# Patient Record
Sex: Male | Born: 1945 | Race: Black or African American | Hispanic: No | Marital: Single | Smoking: Former smoker
Health system: Southern US, Community
[De-identification: ages and names within clinical notes are randomized; demographics above are authoritative.]

## PROBLEM LIST (undated history)

## (undated) DIAGNOSIS — R1312 Dysphagia, oropharyngeal phase: Secondary | ICD-10-CM

## (undated) DIAGNOSIS — E559 Vitamin D deficiency, unspecified: Secondary | ICD-10-CM

## (undated) DIAGNOSIS — Z8673 Personal history of transient ischemic attack (TIA), and cerebral infarction without residual deficits: Secondary | ICD-10-CM

## (undated) DIAGNOSIS — I639 Cerebral infarction, unspecified: Secondary | ICD-10-CM

## (undated) DIAGNOSIS — K219 Gastro-esophageal reflux disease without esophagitis: Secondary | ICD-10-CM

## (undated) DIAGNOSIS — Z789 Other specified health status: Secondary | ICD-10-CM

## (undated) DIAGNOSIS — I10 Essential (primary) hypertension: Secondary | ICD-10-CM

## (undated) DIAGNOSIS — M6281 Muscle weakness (generalized): Secondary | ICD-10-CM

## (undated) DIAGNOSIS — K861 Other chronic pancreatitis: Secondary | ICD-10-CM

## (undated) DIAGNOSIS — F32A Depression, unspecified: Secondary | ICD-10-CM

## (undated) DIAGNOSIS — I82409 Acute embolism and thrombosis of unspecified deep veins of unspecified lower extremity: Secondary | ICD-10-CM

## (undated) DIAGNOSIS — D509 Iron deficiency anemia, unspecified: Secondary | ICD-10-CM

## (undated) DIAGNOSIS — K709 Alcoholic liver disease, unspecified: Secondary | ICD-10-CM

## (undated) DIAGNOSIS — D696 Thrombocytopenia, unspecified: Secondary | ICD-10-CM

## (undated) DIAGNOSIS — K859 Acute pancreatitis without necrosis or infection, unspecified: Secondary | ICD-10-CM

## (undated) HISTORY — DX: Personal history of transient ischemic attack (TIA), and cerebral infarction without residual deficits: Z86.73

## (undated) HISTORY — DX: Muscle weakness (generalized): M62.81

## (undated) HISTORY — DX: Cerebral infarction, unspecified: I63.9

## (undated) HISTORY — DX: Other chronic pancreatitis: K86.1

## (undated) HISTORY — DX: Depression, unspecified: F32.A

## (undated) HISTORY — DX: Vitamin D deficiency, unspecified: E55.9

## (undated) HISTORY — DX: Gastro-esophageal reflux disease without esophagitis: K21.9

## (undated) HISTORY — DX: Dysphagia, oropharyngeal phase: R13.12

## (undated) HISTORY — DX: Iron deficiency anemia, unspecified: D50.9

## (undated) HISTORY — DX: Alcoholic liver disease, unspecified: K70.9

## (undated) HISTORY — DX: Thrombocytopenia, unspecified: D69.6

## (undated) HISTORY — DX: Essential (primary) hypertension: I10

## (undated) HISTORY — PX: WHIPPLE PROCEDURE: SHX2667

## (undated) HISTORY — DX: Acute embolism and thrombosis of unspecified deep veins of unspecified lower extremity: I82.409

---

## 2008-01-13 DIAGNOSIS — E785 Hyperlipidemia, unspecified: Secondary | ICD-10-CM | POA: Insufficient documentation

## 2009-09-18 ENCOUNTER — Encounter (INDEPENDENT_AMBULATORY_CARE_PROVIDER_SITE_OTHER): Payer: Self-pay | Admitting: Ophthalmology

## 2009-09-18 ENCOUNTER — Ambulatory Visit (HOSPITAL_COMMUNITY): Admission: RE | Admit: 2009-09-18 | Discharge: 2009-09-18 | Payer: Self-pay | Admitting: Ophthalmology

## 2009-09-18 ENCOUNTER — Ambulatory Visit: Payer: Self-pay | Admitting: Vascular Surgery

## 2009-09-23 ENCOUNTER — Ambulatory Visit: Payer: Self-pay | Admitting: Vascular Surgery

## 2009-10-01 ENCOUNTER — Ambulatory Visit: Payer: Self-pay | Admitting: Vascular Surgery

## 2009-10-04 ENCOUNTER — Inpatient Hospital Stay (HOSPITAL_COMMUNITY): Admission: RE | Admit: 2009-10-04 | Discharge: 2009-10-05 | Payer: Self-pay | Admitting: Vascular Surgery

## 2009-10-04 ENCOUNTER — Encounter: Payer: Self-pay | Admitting: Vascular Surgery

## 2009-10-22 ENCOUNTER — Ambulatory Visit: Payer: Self-pay | Admitting: Vascular Surgery

## 2011-01-19 LAB — CBC
HCT: 28.8 % — ABNORMAL LOW (ref 39.0–52.0)
Hemoglobin: 9.8 g/dL — ABNORMAL LOW (ref 13.0–17.0)
MCHC: 34 g/dL (ref 30.0–36.0)
MCV: 88.1 fL (ref 78.0–100.0)
RBC: 3.27 MIL/uL — ABNORMAL LOW (ref 4.22–5.81)

## 2011-01-19 LAB — BASIC METABOLIC PANEL
CO2: 24 mEq/L (ref 19–32)
Glucose, Bld: 106 mg/dL — ABNORMAL HIGH (ref 70–99)
Potassium: 3.1 mEq/L — ABNORMAL LOW (ref 3.5–5.1)
Sodium: 138 mEq/L (ref 135–145)

## 2011-01-20 LAB — COMPREHENSIVE METABOLIC PANEL
Albumin: 3.8 g/dL (ref 3.5–5.2)
Alkaline Phosphatase: 83 U/L (ref 39–117)
BUN: 18 mg/dL (ref 6–23)
Calcium: 10 mg/dL (ref 8.4–10.5)
Potassium: 3.7 mEq/L (ref 3.5–5.1)
Total Protein: 7.3 g/dL (ref 6.0–8.3)

## 2011-01-20 LAB — CBC
Hemoglobin: 11.2 g/dL — ABNORMAL LOW (ref 13.0–17.0)
MCHC: 34.3 g/dL (ref 30.0–36.0)
MCV: 88.2 fL (ref 78.0–100.0)
RDW: 15.2 % (ref 11.5–15.5)

## 2011-01-20 LAB — URINALYSIS, ROUTINE W REFLEX MICROSCOPIC
Glucose, UA: NEGATIVE mg/dL
Ketones, ur: NEGATIVE mg/dL
Nitrite: NEGATIVE
Protein, ur: NEGATIVE mg/dL
Urobilinogen, UA: 0.2 mg/dL (ref 0.0–1.0)

## 2011-01-20 LAB — CROSSMATCH

## 2011-01-20 LAB — PROTIME-INR
INR: 1.32 (ref 0.00–1.49)
Prothrombin Time: 16.3 seconds — ABNORMAL HIGH (ref 11.6–15.2)

## 2011-01-20 LAB — APTT: aPTT: 31 seconds (ref 24–37)

## 2011-03-03 NOTE — Consult Note (Signed)
NEW PATIENT CONSULTATION   BRODI, KARI  DOB:  [birth year]                                       09/23/2009  [id number]#:[id number]   REGULAR MEDICAL DOCTOR:  Dr. Tyler Aas in [place].   The patient is a 65 year old male patient referred by Dr. Mitzi Davenport for  amaurosis fugax in the right eye with severe right carotid occlusive  disease.  The patient states that over the last 2 years he has had  multiple episodes of transient blindness in the right eye most recent of  which was about 3 weeks ago.  He has had two episodes in the last 2  months.  The last episode lasted about 30 minutes before total return of  vision occurred.  He has had no hemiparesis, aphasia, amaurosis fugax,  diplopia, blurred vision or syncope.  He had a prolonged hospitalization  in [place] from June to early August during which time he and his  daughter described blood clots around near his liver and spleen and what  sounds like a pulmonary embolus.  He was treated with Coumadin which he  continues to take.  I obtained much of the history from his daughter who  accompanied him.  I also called Dr. Chesley Mires today and discussed the  patient's case and his medical issues with Dr. Chesley Mires who provided  further input.   CHRONIC MEDICAL PROBLEMS:  1. Hypertension.  2. Hyperlipidemia.  3. Recent pulmonary embolus (June 2010).  4. Recent pancreatitis.  5. 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  6. 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   PREVIOUS SURGERY:  None.  He is negative for coronary artery disease,  diabetes, COPD or stroke.   FAMILY HISTORY:  Positive for coronary artery disease in father,  negative for diabetes and stroke.   SOCIAL HISTORY:  He is single and has two children.  Smoked 1-1/2 packs  of cigarettes per day for 40+ years, quit  in September of this year.  Also has had a heavy ethanol abuse history, but stopped in June 2010.   REVIEW OF SYSTEMS:  Has occasional chest discomfort, dyspnea on  exertion, urinary frequency, leg discomfort with walking, dizziness,  headaches, joint pain, muscle pain and nosebleeds.   PHYSICAL EXAMINATION:  Vital signs:  Blood pressure 105/72, heart rate  68, respirations 18.  General:  He is alert and oriented x3.  He is a  well-developed, well-nourished male in no apparent distress.  HEENT:  Exam is unremarkable.  EOMs intact.  Neck:  Supple 3+ carotid pulses,  there is a harsh bruit over the right carotid bifurcation, no  lymphadenopathy present.  Chest:  Clear to auscultation.  No wheezing.  Cardiovascular:  Regular rhythm.  No murmurs.  Abdomen:  Soft, nontender  with no masses palpable.  Musculoskeletal:  No major deformities.  Neurologic exam is unremarkable with no focal weakness.  Skin:  Reveals  no rashes.   I reviewed the medical records provided by Dr. Mitzi Davenport and also have  requested further records from the recent hospitalization at [facility]  and have discussed the situation with Dr. Chesley Mires.  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   The patient clearly needs a right carotid endarterectomy and we will  obtain more information and discuss this with him and his daughter in 1  week.  We tentatively scheduled him for right carotid endarterectomy on  Friday the 17th of December.  He has been very difficult to control with  his Coumadin according to Dr. Chesley Mires and his INR was essentially  normal for much of the time since his discharge from the hospital, so he  feels that it would probably be safe to discontinue the Coumadin in the  perioperative period for right carotid surgery.  Other issues will be  in  his heparin-induced thrombocytopenic and will likely need to use  argatroban as an anticoagulant at the time of his surgery.  They will  return in 1 week to discuss these issues.   Quita Skye Hart [place], M.D.  Electronically Signed   JDL/MEDQ  D:  09/23/2009  T:  09/24/2009  Job:  [id number]

## 2011-03-03 NOTE — Assessment & Plan Note (Signed)
OFFICE VISIT   Joe [name], Joe [name]  DOB:  [birth year]                                       10/22/2009  EAVWU#:[id number]   The patient returns today post right carotid endarterectomy performed on  December 17 for a very severe right internal carotid stenosis with  recurrent amaurosis fugax, right eye, with transient episodes of total  blindness.  He has done well since his surgery with no recurrent  episodes of blindness in the right eye.  He has had no hemiparesis,  aphasia, amaurosis fugax, diplopia, blurred vision or syncope.  He does  complain of some discomfort in his right mandibular area with chewing  but states this is slowly improving.  He is having no dysphagia or  hoarseness.  He is back on his Coumadin and his 1 aspirin per day.  He  did have some significant swelling, he states, initially after the  surgery but that has resolved.  He also complains of urinary frequency  over the last few weeks.   On examination today his blood pressure is 151/89, heart rate is 55,  respirations 14, temperature 98.  Neurologic exam is normal.  Right neck  incision is well-healed.  There is not undue edema in the neck.  He has  3+ carotid pulses with no bruits audible.   I think in general he is doing quite well, and he will be following up  with Dr. Chesley Mires in [facility] regarding possible urinary infection  if he continues to be symptomatic and also regarding the plans for  Coumadin.  He will also probably be seen in [facility] regarding this.  He will see me in 6 months with a followup carotid duplex exam unless he  develops any neurologic symptoms in the interim.     Quita Skye Hart [place], M.D.  Electronically Signed   JDL/MEDQ  D:  10/22/2009  T:  10/23/2009  Job:  [id number]

## 2011-03-03 NOTE — Assessment & Plan Note (Signed)
OFFICE VISIT   Joe [name], Joe [name]  DOB:  Jun 09, 1946                                       10/01/2009  UEAVW#:[id number]   The patient returns today for further discussion regarding his severe  right carotid stenosis and symptoms of amaurosis fugax in the right eye.  Over the last week since I last saw him he has had several episodes  lasting 20-30 minutes of blurred vision in the right eye but no total  blindness as he has had in the past.  He has had no hemiparesis or  aphasia or other neurologic symptoms.  He also has had no chest  discomfort, dyspnea on exertion, PND, orthopnea or other specific  symptoms.   CHRONIC MEDICAL PROBLEMS:  1. Include hypertension.  2. Hyperlipidemia.  3. Possible recent pulmonary embolus in June of 2010.  4. Chronic pancreatitis.  5. History of severe mesenteric venous and portal vein and splenic      vein thrombosis and hospitalization at [facility].  6. Questionable history of heparin induced thrombocytopenia in July      and August in [place].   FAMILY HISTORY:  Is positive for coronary artery disease.  Negative for  diabetes and stroke.   SOCIAL HISTORY:  He has smoked one half pack of cigarettes a day for 40+  years but quit in September of this year.  He is not using alcohol at  the present time having stopped in June of 2010.   PHYSICAL EXAMINATION:  Vital signs:  Blood pressure today is 140/70,  heart rate 70, respirations 14.  Right carotid pulse is 3+ with a high-  pitched bruit.  Neurological:  Normal.  Chest:  Clear to auscultation.  Cardiovascular:  Regular rhythm, no murmurs.  Abdomen:  Soft, nontender  with no masses.  Neurologic:  Exam is normal.  He is alert and oriented.  He is a well-developed, well-nourished male who is in no apparent  distress.   I have researched his history of heparin induced thrombocytopenia by  having 2 telephone conversations with Dr. Janie Morning in [place] who  spoke also  with Dr. Evelena Asa who are the coagulation specialists at  [place].  They are unsure that the patient truly had heparin induced  thrombocytopenia as all of the assays were negative at that time  although he did have a drop in his platelet count.  Their recommendation  is not to treat him with Argatroban or lepirudin but to use heparin and  reverse this with protamine as we normally do but to monitor his  platelet count.  They also question whether he needs to continue on  Coumadin postoperatively and they have recommended referring him back to  [facility] via Dr. Steele Sizer for further discussion of this.   The plan is to admit the patient on this Friday, December 17 for right  carotid endarterectomy.  The risks and benefits have been thoroughly  discussed with the patient and his daughter and they would like to  proceed.  He has been off Coumadin now for 3 days.   Joe [name], M.D.  Electronically Signed   JDL/MEDQ  D:  10/01/2009  T:  10/02/2009  Job:  [id number]   cc:   Dr Raeanne Barry

## 2011-05-25 DIAGNOSIS — K7030 Alcoholic cirrhosis of liver without ascites: Secondary | ICD-10-CM | POA: Insufficient documentation

## 2014-12-04 DIAGNOSIS — I10 Essential (primary) hypertension: Secondary | ICD-10-CM | POA: Insufficient documentation

## 2017-04-29 DIAGNOSIS — J449 Chronic obstructive pulmonary disease, unspecified: Secondary | ICD-10-CM | POA: Insufficient documentation

## 2018-03-19 ENCOUNTER — Inpatient Hospital Stay
Admission: EM | Admit: 2018-03-19 | Discharge: 2018-03-29 | DRG: 438 | Disposition: A | Discharge status: Skilled Nursing Facility | Payer: Medicare Other | Attending: Internal Medicine | Admitting: Internal Medicine

## 2018-03-19 ENCOUNTER — Other Ambulatory Visit: Payer: Self-pay

## 2018-03-19 ENCOUNTER — Encounter: Payer: Self-pay | Admitting: Emergency Medicine

## 2018-03-19 ENCOUNTER — Emergency Department: Payer: Medicare Other

## 2018-03-19 DIAGNOSIS — Z66 Do not resuscitate: Secondary | ICD-10-CM | POA: Diagnosis not present

## 2018-03-19 DIAGNOSIS — R0902 Hypoxemia: Secondary | ICD-10-CM | POA: Diagnosis present

## 2018-03-19 DIAGNOSIS — E440 Moderate protein-calorie malnutrition: Secondary | ICD-10-CM | POA: Diagnosis not present

## 2018-03-19 DIAGNOSIS — R0602 Shortness of breath: Secondary | ICD-10-CM | POA: Diagnosis not present

## 2018-03-19 DIAGNOSIS — E876 Hypokalemia: Secondary | ICD-10-CM | POA: Diagnosis present

## 2018-03-19 DIAGNOSIS — N170 Acute kidney failure with tubular necrosis: Secondary | ICD-10-CM | POA: Diagnosis present

## 2018-03-19 DIAGNOSIS — Z7901 Long term (current) use of anticoagulants: Secondary | ICD-10-CM

## 2018-03-19 DIAGNOSIS — K769 Liver disease, unspecified: Secondary | ICD-10-CM | POA: Diagnosis present

## 2018-03-19 DIAGNOSIS — G9341 Metabolic encephalopathy: Secondary | ICD-10-CM | POA: Diagnosis not present

## 2018-03-19 DIAGNOSIS — I10 Essential (primary) hypertension: Secondary | ICD-10-CM | POA: Diagnosis present

## 2018-03-19 DIAGNOSIS — R531 Weakness: Secondary | ICD-10-CM | POA: Diagnosis not present

## 2018-03-19 DIAGNOSIS — I6782 Cerebral ischemia: Secondary | ICD-10-CM | POA: Diagnosis present

## 2018-03-19 DIAGNOSIS — K859 Acute pancreatitis without necrosis or infection, unspecified: Principal | ICD-10-CM

## 2018-03-19 DIAGNOSIS — R443 Hallucinations, unspecified: Secondary | ICD-10-CM | POA: Diagnosis not present

## 2018-03-19 DIAGNOSIS — R1900 Intra-abdominal and pelvic swelling, mass and lump, unspecified site: Secondary | ICD-10-CM | POA: Diagnosis not present

## 2018-03-19 DIAGNOSIS — R0989 Other specified symptoms and signs involving the circulatory and respiratory systems: Secondary | ICD-10-CM

## 2018-03-19 DIAGNOSIS — R609 Edema, unspecified: Secondary | ICD-10-CM

## 2018-03-19 DIAGNOSIS — Z7189 Other specified counseling: Secondary | ICD-10-CM | POA: Diagnosis not present

## 2018-03-19 DIAGNOSIS — J9811 Atelectasis: Secondary | ICD-10-CM | POA: Diagnosis present

## 2018-03-19 DIAGNOSIS — Z8249 Family history of ischemic heart disease and other diseases of the circulatory system: Secondary | ICD-10-CM | POA: Diagnosis not present

## 2018-03-19 DIAGNOSIS — Z79899 Other long term (current) drug therapy: Secondary | ICD-10-CM

## 2018-03-19 DIAGNOSIS — K861 Other chronic pancreatitis: Secondary | ICD-10-CM | POA: Diagnosis present

## 2018-03-19 DIAGNOSIS — Z90411 Acquired partial absence of pancreas: Secondary | ICD-10-CM

## 2018-03-19 DIAGNOSIS — I739 Peripheral vascular disease, unspecified: Secondary | ICD-10-CM | POA: Diagnosis present

## 2018-03-19 DIAGNOSIS — E162 Hypoglycemia, unspecified: Secondary | ICD-10-CM | POA: Diagnosis present

## 2018-03-19 DIAGNOSIS — N390 Urinary tract infection, site not specified: Secondary | ICD-10-CM | POA: Diagnosis present

## 2018-03-19 DIAGNOSIS — D696 Thrombocytopenia, unspecified: Secondary | ICD-10-CM

## 2018-03-19 DIAGNOSIS — I639 Cerebral infarction, unspecified: Secondary | ICD-10-CM

## 2018-03-19 DIAGNOSIS — J90 Pleural effusion, not elsewhere classified: Secondary | ICD-10-CM | POA: Diagnosis present

## 2018-03-19 DIAGNOSIS — I5030 Unspecified diastolic (congestive) heart failure: Secondary | ICD-10-CM | POA: Diagnosis not present

## 2018-03-19 DIAGNOSIS — Z86718 Personal history of other venous thrombosis and embolism: Secondary | ICD-10-CM

## 2018-03-19 DIAGNOSIS — R112 Nausea with vomiting, unspecified: Secondary | ICD-10-CM | POA: Diagnosis present

## 2018-03-19 DIAGNOSIS — Z7982 Long term (current) use of aspirin: Secondary | ICD-10-CM | POA: Diagnosis not present

## 2018-03-19 DIAGNOSIS — R0603 Acute respiratory distress: Secondary | ICD-10-CM | POA: Diagnosis present

## 2018-03-19 DIAGNOSIS — Z885 Allergy status to narcotic agent status: Secondary | ICD-10-CM

## 2018-03-19 DIAGNOSIS — F05 Delirium due to known physiological condition: Secondary | ICD-10-CM | POA: Diagnosis not present

## 2018-03-19 DIAGNOSIS — F17210 Nicotine dependence, cigarettes, uncomplicated: Secondary | ICD-10-CM | POA: Diagnosis not present

## 2018-03-19 DIAGNOSIS — G9340 Encephalopathy, unspecified: Secondary | ICD-10-CM | POA: Diagnosis not present

## 2018-03-19 DIAGNOSIS — E861 Hypovolemia: Secondary | ICD-10-CM | POA: Diagnosis not present

## 2018-03-19 DIAGNOSIS — R410 Disorientation, unspecified: Secondary | ICD-10-CM | POA: Diagnosis not present

## 2018-03-19 DIAGNOSIS — E874 Mixed disorder of acid-base balance: Secondary | ICD-10-CM | POA: Diagnosis present

## 2018-03-19 DIAGNOSIS — R1011 Right upper quadrant pain: Secondary | ICD-10-CM | POA: Diagnosis not present

## 2018-03-19 DIAGNOSIS — I824Y1 Acute embolism and thrombosis of unspecified deep veins of right proximal lower extremity: Secondary | ICD-10-CM | POA: Diagnosis not present

## 2018-03-19 DIAGNOSIS — Z515 Encounter for palliative care: Secondary | ICD-10-CM | POA: Diagnosis not present

## 2018-03-19 HISTORY — DX: Acute pancreatitis without necrosis or infection, unspecified: K85.90

## 2018-03-19 HISTORY — DX: Other specified health status: Z78.9

## 2018-03-19 LAB — CBC WITH DIFFERENTIAL/PLATELET
Basophils Absolute: 0 10*3/uL (ref 0–0.1)
Basophils Relative: 0 %
EOS PCT: 0 %
Eosinophils Absolute: 0 10*3/uL (ref 0–0.7)
HEMATOCRIT: 33.2 % — AB (ref 40.0–52.0)
HEMOGLOBIN: 10.7 g/dL — AB (ref 13.0–18.0)
LYMPHS ABS: 0.3 10*3/uL — AB (ref 1.0–3.6)
LYMPHS PCT: 6 %
MCH: 24 pg — AB (ref 26.0–34.0)
MCHC: 32.1 g/dL (ref 32.0–36.0)
MCV: 74.5 fL — AB (ref 80.0–100.0)
Monocytes Absolute: 0 10*3/uL — ABNORMAL LOW (ref 0.2–1.0)
Monocytes Relative: 1 %
NEUTROS ABS: 4.5 10*3/uL (ref 1.4–6.5)
NEUTROS PCT: 93 %
Platelets: 129 10*3/uL — ABNORMAL LOW (ref 150–440)
RBC: 4.45 MIL/uL (ref 4.40–5.90)
RDW: 21.3 % — ABNORMAL HIGH (ref 11.5–14.5)
WBC: 4.9 10*3/uL (ref 3.8–10.6)

## 2018-03-19 LAB — COMPREHENSIVE METABOLIC PANEL
ALK PHOS: 100 U/L (ref 38–126)
ALT: 15 U/L — AB (ref 17–63)
AST: 52 U/L — ABNORMAL HIGH (ref 15–41)
Albumin: 3 g/dL — ABNORMAL LOW (ref 3.5–5.0)
Anion gap: 12 (ref 5–15)
BILIRUBIN TOTAL: 2.9 mg/dL — AB (ref 0.3–1.2)
BUN: 16 mg/dL (ref 6–20)
CALCIUM: 8.4 mg/dL — AB (ref 8.9–10.3)
CO2: 15 mmol/L — ABNORMAL LOW (ref 22–32)
CREATININE: 1.04 mg/dL (ref 0.61–1.24)
Chloride: 110 mmol/L (ref 101–111)
GFR calc non Af Amer: >60 mL/min (ref >60)
Glucose, Bld: 85 mg/dL (ref 65–99)
Potassium: 3.2 mmol/L — ABNORMAL LOW (ref 3.5–5.1)
Sodium: 137 mmol/L (ref 135–145)
Total Protein: 6.4 g/dL — ABNORMAL LOW (ref 6.5–8.1)

## 2018-03-19 LAB — MAGNESIUM: Magnesium: 1.3 mg/dL — ABNORMAL LOW (ref 1.7–2.4)

## 2018-03-19 LAB — LIPASE, BLOOD: LIPASE: 312 U/L — AB (ref 11–51)

## 2018-03-19 MED ORDER — IOHEXOL 300 MG/ML  SOLN: 75.0000 mL | Freq: Once | INTRAMUSCULAR | Status: DC | PRN

## 2018-03-19 MED ORDER — ONDANSETRON HCL 4 MG/2ML IJ SOLN: 4.0000 mg | Freq: Four times a day (QID) | INTRAMUSCULAR | Status: DC | PRN

## 2018-03-19 MED ORDER — MIRTAZAPINE 15 MG PO TABS
15.0000 mg | ORAL_TABLET | Freq: Every day | ORAL | Status: DC
  Administered 2018-03-19 – 2018-03-20 (×2): 15 mg via ORAL
  Filled 2018-03-19 (×2): qty 1

## 2018-03-19 MED ORDER — ONDANSETRON HCL 4 MG/2ML IJ SOLN
4.0000 mg | Freq: Once | INTRAMUSCULAR | Status: AC
  Administered 2018-03-19: 4 mg via INTRAVENOUS
  Filled 2018-03-19: qty 2

## 2018-03-19 MED ORDER — ACETAMINOPHEN 325 MG PO TABS: 650.0000 mg | ORAL_TABLET | Freq: Four times a day (QID) | ORAL | Status: DC | PRN

## 2018-03-19 MED ORDER — SODIUM CHLORIDE 0.9 % IV BOLUS
500.0000 mL | Freq: Once | INTRAVENOUS | Status: AC
  Administered 2018-03-19: 500 mL via INTRAVENOUS

## 2018-03-19 MED ORDER — ACETAMINOPHEN 650 MG RE SUPP: 650.0000 mg | Freq: Four times a day (QID) | RECTAL | Status: DC | PRN

## 2018-03-19 MED ORDER — ONDANSETRON HCL 4 MG PO TABS: 4.0000 mg | ORAL_TABLET | Freq: Four times a day (QID) | ORAL | Status: DC | PRN

## 2018-03-19 MED ORDER — IOPAMIDOL (ISOVUE-300) INJECTION 61%: 75.0000 mL | Freq: Once | INTRAVENOUS | Status: DC | PRN

## 2018-03-19 MED ORDER — POTASSIUM CHLORIDE IN NACL 20-0.9 MEQ/L-% IV SOLN
INTRAVENOUS | Status: DC
  Administered 2018-03-19 – 2018-03-20 (×3): via INTRAVENOUS
  Filled 2018-03-19 (×6): qty 1000

## 2018-03-19 MED ORDER — HYDROMORPHONE HCL 1 MG/ML IJ SOLN
1.0000 mg | INTRAMUSCULAR | Status: DC | PRN
  Administered 2018-03-20 – 2018-03-21 (×4): 1 mg via INTRAVENOUS
  Filled 2018-03-19 (×4): qty 1

## 2018-03-19 MED ORDER — SODIUM CHLORIDE 0.9 % IV BOLUS
1000.0000 mL | Freq: Once | INTRAVENOUS | Status: AC
  Administered 2018-03-19: 1000 mL via INTRAVENOUS

## 2018-03-19 MED ORDER — RIVAROXABAN 20 MG PO TABS
20.0000 mg | ORAL_TABLET | Freq: Every day | ORAL | Status: DC
  Administered 2018-03-19: 20 mg via ORAL
  Filled 2018-03-19 (×3): qty 1

## 2018-03-19 MED ORDER — FENTANYL CITRATE (PF) 100 MCG/2ML IJ SOLN
50.0000 ug | Freq: Once | INTRAMUSCULAR | Status: AC
Start: 2018-03-19 — End: 2018-03-19
  Administered 2018-03-19: 50 ug via INTRAVENOUS
  Filled 2018-03-19: qty 2

## 2018-03-19 MED ORDER — HALOPERIDOL LACTATE 5 MG/ML IJ SOLN
2.5000 mg | Freq: Four times a day (QID) | INTRAMUSCULAR | Status: DC | PRN
  Administered 2018-03-20 (×3): 2.5 mg via INTRAVENOUS
  Filled 2018-03-19 (×3): qty 1

## 2018-03-19 NOTE — ED Notes (Signed)
Pt reports abdominal cramping at this time, notified MD Derrill KayGoodman. Pt is more alert at this time. Will continue to monitor.

## 2018-03-19 NOTE — ED Provider Notes (Signed)
[facility] Emergency Department Provider Note   ____________________________________________   I have reviewed the triage vital signs and the nursing notes.   HISTORY  Chief Complaint Emesis and Diarrhea   History limited by: Not Limited   HPI Joe [name] is a 72 y.o. male who presents to the emergency department today because of nausea, vomiting and abdominal pain. Symptoms started yesterday. They started suddenly. He has had multiple episodes of vomiting and diarrhea, both non bloody. This has been accompanied by generalized abdominal pain. Patient has history of pancreatic surgery although he is not sure why or what they did. Denies that it was cancer. Denies any recent abnormal ingestion. Denies any recent sick contacts.     Per medical record review patient has a history of pancreatitis, venous thrombosis.   Past Medical History:  Diagnosis Date  . Pancreatitis   . Patient denies medical problems     There are no active problems to display for this patient.     Prior to Admission medications   Not on File    Allergies Morphine and related  Family History  Problem Relation Age of Onset  . Brain cancer Mother   . Heart disease Brother     Social History Social History   Tobacco Use  . Smoking status: Current Every Day Smoker    Packs/day: 0.50    Years: 50.00    Pack years: 25.00    Types: Cigarettes  Substance Use Topics  . Alcohol use: Not Currently  . Drug use: Not Currently    Review of Systems Constitutional: Positive for fever Eyes: No visual changes. ENT: No sore throat. Cardiovascular: Denies chest pain. Respiratory: Denies shortness of breath. Gastrointestinal: Positive for generalized abdominal pain. Positive for nausea and vomiting.  Genitourinary: Negative for dysuria. Musculoskeletal: Negative for back pain. Skin: Negative for rash. Neurological: Negative for headaches, focal weakness or  numbness.  ____________________________________________   PHYSICAL EXAM:  VITAL SIGNS: ED Triage Vitals [03/19/18 1117]  Enc Vitals Group     BP 98/59     Pulse 115     Resp 24     Temp 100.1     Temp src      SpO2 100     Weight      Height      Head Circumference      Peak Flow      Pain Score 10   Constitutional: Alert and oriented.  Eyes: Conjunctivae are normal.  ENT      Head: Normocephalic and atraumatic.      Nose: No congestion/rhinnorhea.      Mouth/Throat: Mucous membranes are moist.      Neck: No stridor. Hematological/Lymphatic/Immunilogical: No cervical lymphadenopathy. Cardiovascular: Normal rate, regular rhythm.  No murmurs, rubs, or gallops.  Respiratory: Normal respiratory effort without tachypnea nor retractions. Breath sounds are clear and equal bilaterally. No wheezes/rales/rhonchi. Gastrointestinal: Soft and tender to palpation in the epigastric region. No rebound. No guarding.  Genitourinary: Deferred Musculoskeletal: Normal range of motion in all extremities. No lower extremity edema. Neurologic:  Normal speech and language. No gross focal neurologic deficits are appreciated.  Skin:  Skin is warm, dry and intact. No rash noted. Psychiatric: Mood and affect are normal. Speech and behavior are normal. Patient exhibits appropriate insight and judgment.  ____________________________________________    LABS (pertinent positives/negatives)  Lipase 312 CMP na 137, k 3.2, cr 1.04 CBC wbc 4.9, hgb 10.7, plt 129  ____________________________________________   EKG  None  ____________________________________________    RADIOLOGY  CT abd/pel Post surgical changes  ____________________________________________   PROCEDURES  Procedures  Angiocath insertion Performed by: Phineas Semen  Consent: Verbal consent obtained. Risks and benefits: risks, benefits and alternatives were discussed Time out: Immediately prior to procedure a  "time out" was called to verify the correct patient, procedure, equipment, support staff and site/side marked as required.  Preparation: Patient was prepped and draped in the usual sterile fashion.  Vein Location: left a-c  Ultrasound Guided  Gauge: 20  Normal blood return and flush without difficulty Patient tolerance: Patient tolerated the procedure well with no immediate complications.  ____________________________________________   INITIAL IMPRESSION / ASSESSMENT AND PLAN / ED COURSE  Pertinent labs & imaging results that were available during my care of the patient were reviewed by me and considered in my medical decision making (see chart for details).   Patient presented to the emergency department today because of concerns for abdominal pain nausea vomiting.  Differential would be broad including gastroenteritis gastritis pancreatitis biliary disease amongst other etiologies.  Patient's lipase is elevated 300.  I did obtain a CT given history of surgeries in the past.  This however did not show any acute findings.  Patient did have significant pain.  Will plan on admission for IV hydration pain control. Discussed findings and plan with patient and family.   ____________________________________________   FINAL CLINICAL IMPRESSION(S) / ED DIAGNOSES  Final diagnoses:  Acute pancreatitis, unspecified complication status, unspecified pancreatitis type     Note: This dictation was prepared with Dragon dictation. Any transcriptional errors that result from this process are unintentional     Phineas Semen, MD 03/20/18 1558

## 2018-03-19 NOTE — ED Notes (Signed)
Report called to floor RN and pt updated with plan of care.

## 2018-03-19 NOTE — ED Triage Notes (Signed)
Arrives with C/O vomiting and diarrhea x 1 day.  C/o weakness.

## 2018-03-19 NOTE — ED Notes (Signed)
Pt resting on stretcher at this time. Family updated on plan of care and awaiting bed assignment. Call bell within reach, will continue to monitor.

## 2018-03-19 NOTE — ED Notes (Signed)
Pt and family updated with plan of care. VSS. Call bell within reach, will continue to monitor.

## 2018-03-19 NOTE — ED Notes (Signed)
Pt daughter at bedside reports "he usually needs to be admitted when he gets pancreatitis and sometimes gets cdiff in his stool when he gets antibiotics" notified MD Derrill KayGoodman. Pt asleep with even/unlabored respirations. Will continue to monitor.

## 2018-03-19 NOTE — ED Notes (Signed)
RN and medic attempted IV start without success. Minimal lab work obtained and sent to lab. Pt reports he is hard stick, notified MD Derrill KayGoodman who will attempt IV started. Pt and family updated. Daughter is at bedside now.

## 2018-03-19 NOTE — ED Notes (Signed)
MD Derrill KayGoodman at bedside and obtained US guided IV.

## 2018-03-19 NOTE — ED Notes (Addendum)
Pt arrives via EMS for N/V/D for last couple of days. Hx of wipple and chronic pancreatitis. Pt lives with brother who reports that the pt has not been acting himself over last couple days. +weak and lethargic. Pt only alert to himself at this time. +nausea. Pt reports back and abdominal pain. Pt denies SOB or CP.

## 2018-03-19 NOTE — H&P (Signed)
[facility] [place] at [facility]   PATIENT NAME: Joe [name]    MR#:  [id number]  DATE OF BIRTH:  1946-06-05  DATE OF ADMISSION:  03/19/2018  PRIMARY CARE PHYSICIAN: Patient, [name] Per   REQUESTING/REFERRING PHYSICIAN: Dr. Phineas Semen  CHIEF COMPLAINT:   Chief Complaint  Patient presents with  . Emesis  . Diarrhea    HISTORY OF PRESENT ILLNESS:  Joe [name]  is a 72 y.o. male with a known history of chronic pancreatitis, status post Whipple procedure, essential hypertension, previous history of DVT who presents to the hospital due to nausea vomiting and also abdominal pain.  Patient presently is somewhat confused and therefore most of the history obtained from the daughter at bedside.  Patient resides with his brother who noted that the patient was having multiple episodes of nausea vomiting today.  He then became increasingly weak and could not get out of bed and therefore he called EMS and he was brought to the ER for further evaluation.  In the ER patient continues to complain of generalized abdominal pain but has not had any further nausea vomiting.  Patient's blood work was consistent with mild acute pancreatitis.  His CT scan of the abdomen pelvis shows postsurgical changes from his previous Whipple procedure but no evidence of acute pancreatitis.  Hospitalist services were contacted for treatment evaluation.  Patient denies any fever, cough, congestion, melena, hematochezia, or any other associated symptoms presently.  PAST MEDICAL HISTORY:   Past Medical History:  Diagnosis Date  . Pancreatitis   . Patient denies medical problems     PAST SURGICAL HISTORY:   S/p Whipple   SOCIAL HISTORY:   Social History   Tobacco Use  . Smoking status: Current Every Day Smoker    Packs/day: 0.50    Years: 50.00    Pack years: 25.00    Types: Cigarettes  Substance Use Topics  . Alcohol use: Not Currently    FAMILY HISTORY:   Family History  Problem  Relation Age of Onset  . Brain cancer Mother   . Heart disease Brother     DRUG ALLERGIES:   Allergies  Allergen Reactions  . Morphine And Related Hives and Itching    REVIEW OF SYSTEMS:   Review of Systems  Constitutional: Negative for fever and weight loss.  HENT: Negative for congestion, nosebleeds and tinnitus.   Eyes: Negative for blurred vision, double vision and redness.  Respiratory: Negative for cough, hemoptysis and shortness of breath.   Cardiovascular: Negative for chest pain, orthopnea, leg swelling and PND.  Gastrointestinal: Positive for abdominal pain, nausea and vomiting. Negative for diarrhea and melena.  Genitourinary: Negative for dysuria, hematuria and urgency.  Musculoskeletal: Negative for falls and joint pain.  Neurological: Negative for dizziness, tingling, sensory change, focal weakness, seizures, weakness and headaches.  Endo/Heme/Allergies: Negative for polydipsia. Does not bruise/bleed easily.  Psychiatric/Behavioral: Negative for depression and memory loss. The patient is not nervous/anxious.     MEDICATIONS AT HOME:   Prior to Admission medications   Medication Sig Start Date End Date Taking? Authorizing Provider  carvedilol (COREG) 3.125 MG tablet Take 3.125 mg by mouth 2 (two) times daily with a meal.   Yes [provider]  lipase/protease/amylase (CREON) 36000 UNITS CPEP capsule Take 72,000 Units by mouth 3 (three) times daily with meals.   Yes [provider]  mirtazapine (REMERON) 15 MG tablet Take 15 mg by mouth at bedtime.   Yes [provider]  rivaroxaban (  XARELTO) 20 MG TABS tablet Take 20 mg by mouth daily with supper.   Yes [provider]      VITAL SIGNS:  Blood pressure (!) 108/58, pulse 84, temperature 100.1 F (37.8 C), temperature source Oral, resp. rate 20, height 5\' 8"  (1.727 m), weight 59 kg (130 lb), SpO2 92 %.  PHYSICAL EXAMINATION:  Physical Exam  GENERAL:  72 y.o.-year-old patient  lying in bed with no acute distress.  EYES: Pupils equal, round, reactive to light and accommodation. No scleral icterus. Extraocular muscles intact.  HEENT: Head atraumatic, normocephalic. Oropharynx and nasopharynx clear. No oropharyngeal erythema, dry oral mucosa  NECK:  Supple, no jugular venous distention. No thyroid enlargement, no tenderness.  LUNGS: Normal breath sounds bilaterally, no wheezing, rales, rhonchi. No use of accessory muscles of respiration.  CARDIOVASCULAR: S1, S2 RRR. No murmurs, rubs, gallops, clicks.  ABDOMEN: Soft, tender in the epigastric area, no rebound, rigidity, nondistended. Bowel sounds present. No organomegaly or mass.  EXTREMITIES: No pedal edema, cyanosis, or clubbing. + 2 pedal & radial pulses b/l.   NEUROLOGIC: Cranial nerves II through XII are intact. No focal Motor or sensory deficits appreciated b/l. Globally weak.  PSYCHIATRIC: The patient is alert and oriented x 1.  SKIN: No obvious rash, lesion, or ulcer.   LABORATORY PANEL:   CBC Recent Labs  Lab 03/19/18 1149  WBC 4.9  HGB 10.7*  HCT 33.2*  PLT 129*   ------------------------------------------------------------------------------------------------------------------  Chemistries  Recent Labs  Lab 03/19/18 1149  NA 137  K 3.2*  CL 110  CO2 15*  GLUCOSE 85  BUN 16  CREATININE 1.04  CALCIUM 8.4*  AST 52*  ALT 15*  ALKPHOS 100  BILITOT 2.9*   ------------------------------------------------------------------------------------------------------------------  Cardiac Enzymes No results for input(s): TROPONINI in the last 168 hours. ------------------------------------------------------------------------------------------------------------------  RADIOLOGY:  Ct Abdomen Pelvis Wo Contrast  Result Date: 03/19/2018 CLINICAL DATA:  72 year old male with nausea, vomiting, diarrhea, abdominal and back pain. History of chronic pancreatitis. EXAM: CT ABDOMEN AND PELVIS WITHOUT CONTRAST  TECHNIQUE: Multidetector CT imaging of the abdomen and pelvis was performed following the standard protocol without IV contrast. COMPARISON:  Prior CT scan of the chest 10/05/2009 FINDINGS: Lower chest: The visualized lower chest is clear. The intracardiac blood pool is hypodense relative to the adjacent myocardium consistent with anemia. No pericardial effusion. Unremarkable distal thoracic esophagus. Hepatobiliary: There are 2 gastro hepatic biliary drainage catheters. One extends from the common bile duct into the gastric lumen, and the second extends from the proximal small bowel into the stomach. Scattered pneumobilia is present and not unexpected. There is persistent dilatation of the right posterior bile ducts. No definite discrete hepatic lesion. Pancreas: Sequelae of chronic pancreatitis with multiple small pancreatic calcifications. Dilatation of the main pancreatic duct is present. Spleen: Within normal limits Adrenals/Urinary Tract: Unremarkable adrenal glands. Nonspecific perinephric stranding bilaterally worse on the left than the right. No hydronephrosis or nephrolithiasis. Unremarkable ureters and bladder. Stomach/Bowel: Colonic diverticular disease without CT evidence of active inflammation. Surgical changes of prior right hemicolectomy. Surgical changes of prior partial gastrectomy. No evidence of a bowel obstruction or focal bowel wall thickening. Vascular/Lymphatic: Limited evaluation in the absence of intravenous contrast. Extensive atherosclerotic vascular calcifications. No aneurysm. Reproductive: Prostate is unremarkable. Other: No abdominal wall hernia or abnormality. No abdominopelvic ascites. Musculoskeletal: Chronic bilateral L5 pars defects. Mild grade 1 anterolisthesis of L5 on S1. Multilevel degenerative disc disease. No acute fracture or malalignment. IMPRESSION: 1. Two gastro hepatic biliary drainage catheters are present, 1  extending from the stomach into the common bile duct, and  the second from the stomach into the proximal small bowel. There is expected pneumobilia, however the right posterior bile ducts are slightly dilated. It is unclear if this is chronic, or an interval change without prior imaging for comparison. Recommend clinical correlation for signs and symptoms of cholangitis. 2. Extensive surgical changes including prior partial gastric resection with gastroenteric anastomosis and prior right hemicolectomy. No evidence of bowel obstruction. 3. Colonic diverticular disease without CT evidence of active inflammation. 4. The intracardiac blood pool is hypodense relative to the adjacent myocardium consistent with anemia. 5. Sequelae of chronic pancreatitis with likely chronic dilatation of the main pancreatic duct. 6. Chronic bilateral L5 pars defects with mild grade 1 anterolisthesis of L5 on S1. 7.  Aortic Atherosclerosis (ICD10-170.0) Electronically Signed   By: Malachy Moan M.D.   On: 03/19/2018 13:55     IMPRESSION AND PLAN:   72 year old male with past medical history of chronic pancreatitis, hypertension, status post Whipple procedure, previous history of DVT who presents to the hospital due to abdominal pain, nausea/vomiting and noted to have acute on chronic pancreatitis.  1.  Acute on chronic pancreatitis-this is a cause of patient's worsening abdominal pain/nausea vomiting.  Patient CT scan of the abdomen pelvis is suggestive of postsurgical changes from his previous Whipple procedure without any acute active inflammation presently. -Patient's lipase is although mildly elevated, and his clinical symptoms are consistent with pancreatitis. - We will treat the patient supportively with IV fluids, pain control, antiemetics.  Keep patient n.p.o. for now. -Follow lipase and LFTs in the morning.  2.  History of chronic pancreatitis- hold off on Creon supplements until patient can take p.o.  3.  History of previous DVT-continue Xarelto.  4.  Essential  hypertension-hold carvedilol given the patient's relative hypotension.  5.  Hypokalemia-secondary to the intractable nausea vomiting.  Will replace potassium intravenously.  Check magnesium level.  Repeat level in the morning.    All the records are reviewed and case discussed with ED provider. Management plans discussed with the patient, family and they are in agreement.  CODE STATUS: Full code  TOTAL TIME TAKING CARE OF THIS PATIENT: 45 minutes.    Houston Siren M.D on 03/19/2018 at 5:38 PM  Between 7am to 6pm - Pager - [phone]  After 6pm go to [email or web address] - password [email or web address]  [facility]  Office  [phone]  CC: Primary care physician; Patient, [name] Per

## 2018-03-20 ENCOUNTER — Inpatient Hospital Stay: Payer: Medicare Other

## 2018-03-20 LAB — COMPREHENSIVE METABOLIC PANEL
ALBUMIN: 2.4 g/dL — AB (ref 3.5–5.0)
ALK PHOS: 78 U/L (ref 38–126)
ALT: 28 U/L (ref 17–63)
AST: 87 U/L — ABNORMAL HIGH (ref 15–41)
Anion gap: 9 (ref 5–15)
BILIRUBIN TOTAL: 4.3 mg/dL — AB (ref 0.3–1.2)
BUN: 25 mg/dL — ABNORMAL HIGH (ref 6–20)
CALCIUM: 7.6 mg/dL — AB (ref 8.9–10.3)
CO2: 15 mmol/L — AB (ref 22–32)
CREATININE: 1.95 mg/dL — AB (ref 0.61–1.24)
Chloride: 116 mmol/L — ABNORMAL HIGH (ref 101–111)
GFR calc non Af Amer: 33 mL/min — ABNORMAL LOW (ref >60)
GFR, EST AFRICAN AMERICAN: 38 mL/min — AB (ref >60)
GLUCOSE: 73 mg/dL (ref 65–99)
Potassium: 3.8 mmol/L (ref 3.5–5.1)
SODIUM: 140 mmol/L (ref 135–145)
TOTAL PROTEIN: 5.5 g/dL — AB (ref 6.5–8.1)

## 2018-03-20 LAB — CBC
HCT: 32.3 % — ABNORMAL LOW (ref 40.0–52.0)
Hemoglobin: 10.3 g/dL — ABNORMAL LOW (ref 13.0–18.0)
MCH: 23.8 pg — AB (ref 26.0–34.0)
MCHC: 31.8 g/dL — ABNORMAL LOW (ref 32.0–36.0)
MCV: 74.8 fL — AB (ref 80.0–100.0)
PLATELETS: 51 10*3/uL — AB (ref 150–440)
RBC: 4.32 MIL/uL — ABNORMAL LOW (ref 4.40–5.90)
RDW: 21.5 % — AB (ref 11.5–14.5)
WBC: 12.6 10*3/uL — ABNORMAL HIGH (ref 3.8–10.6)

## 2018-03-20 LAB — LIPASE, BLOOD: Lipase: 76 U/L — ABNORMAL HIGH (ref 11–51)

## 2018-03-20 LAB — MAGNESIUM: Magnesium: 1.3 mg/dL — ABNORMAL LOW (ref 1.7–2.4)

## 2018-03-20 MED ORDER — FUROSEMIDE 10 MG/ML IJ SOLN
20.0000 mg | Freq: Once | INTRAMUSCULAR | Status: DC
  Filled 2018-03-20: qty 2

## 2018-03-20 MED ORDER — MAGNESIUM SULFATE 2 GM/50ML IV SOLN
2.0000 g | Freq: Once | INTRAVENOUS | Status: AC
  Administered 2018-03-20: 2 g via INTRAVENOUS
  Filled 2018-03-20: qty 50

## 2018-03-20 MED ORDER — SODIUM CHLORIDE 0.9 % IV SOLN
INTRAVENOUS | Status: DC
  Administered 2018-03-20: 16:00:00 via INTRAVENOUS

## 2018-03-20 MED ORDER — ALBUTEROL SULFATE (2.5 MG/3ML) 0.083% IN NEBU
2.5000 mg | INHALATION_SOLUTION | RESPIRATORY_TRACT | Status: DC | PRN
Start: 2018-03-20 — End: 2018-03-29
  Administered 2018-03-20 – 2018-03-28 (×8): 2.5 mg via RESPIRATORY_TRACT
  Filled 2018-03-20 (×9): qty 3

## 2018-03-20 NOTE — Progress Notes (Signed)
Dr Nemiah CommanderKalisetti made aware that pt has new wheezing and slightly labored breathing now which is a change from this morning, fluids going at 17025ml/hr, new order per MD to hold fluids for now, chest xray to be ordered

## 2018-03-20 NOTE — Progress Notes (Signed)
Per MD, hold xarelto r/t platelets 51

## 2018-03-20 NOTE — Progress Notes (Signed)
[facility] [facility] [place] at [facility]   PATIENT NAME: Joe [name]    MR#:  [id number]  DATE OF BIRTH:  12/15/1945  SUBJECTIVE:  CHIEF COMPLAINT:   Chief Complaint  Patient presents with  . Emesis  . Diarrhea   -admitted with acute on chronic pancreatitis.  Was agitated last night and received Haldol.  So sedated this morning.  Also concern for alcohol use, no family available to give a history of this time.  REVIEW OF SYSTEMS:  Review of Systems  Unable to perform ROS: Mental status change    DRUG ALLERGIES:   Allergies  Allergen Reactions  . Morphine And Related Hives and Itching    VITALS:  Blood pressure 100/60, pulse 93, temperature (!) 97.4 F (36.3 C), temperature source Oral, resp. rate (!) 21, height 5\' 9"  (1.753 m), weight 65.8 kg (145 lb), SpO2 94 %.  PHYSICAL EXAMINATION:  Physical Exam   GENERAL:  72 y.o.-year-old patient lying in the bed with no acute distress.  EYES: Pupils equal, round, reactive to light and accommodation. No scleral icterus. Extraocular muscles intact.  HEENT: Head atraumatic, normocephalic. Oropharynx and nasopharynx clear.  NECK:  Supple, no jugular venous distention. No thyroid enlargement, no tenderness.  LUNGS: Normal breath sounds bilaterally, no wheezing, rales,rhonchi or crepitation. No use of accessory muscles of respiration.  CARDIOVASCULAR: S1, S2 normal. No murmurs, rubs, or gallops.  ABDOMEN: Soft, diffuse tenderness all over, nondistended. Bowel sounds present. No organomegaly or mass.  EXTREMITIES: No pedal edema, cyanosis, or clubbing.  NEUROLOGIC: Sedated, responding to tactile stimulation and agitated.  Able to move all extremities in bed PSYCHIATRIC: The patient is sedated.  SKIN: No obvious rash, lesion, or ulcer.    LABORATORY PANEL:   CBC Recent Labs  Lab 03/20/18 0501  WBC 12.6*  HGB 10.3*  HCT 32.3*  PLT 51*    ------------------------------------------------------------------------------------------------------------------  Chemistries  Recent Labs  Lab 03/20/18 0336  NA 140  K 3.8  CL 116*  CO2 15*  GLUCOSE 73  BUN 25*  CREATININE 1.95*  CALCIUM 7.6*  MG 1.3*  AST 87*  ALT 28  ALKPHOS 78  BILITOT 4.3*   ------------------------------------------------------------------------------------------------------------------  Cardiac Enzymes No results for input(s): TROPONINI in the last 168 hours. ------------------------------------------------------------------------------------------------------------------  RADIOLOGY:  Ct Abdomen Pelvis Wo Contrast  Result Date: 03/19/2018 CLINICAL DATA:  72 year old male with nausea, vomiting, diarrhea, abdominal and back pain. History of chronic pancreatitis. EXAM: CT ABDOMEN AND PELVIS WITHOUT CONTRAST TECHNIQUE: Multidetector CT imaging of the abdomen and pelvis was performed following the standard protocol without IV contrast. COMPARISON:  Prior CT scan of the chest 10/05/2009 FINDINGS: Lower chest: The visualized lower chest is clear. The intracardiac blood pool is hypodense relative to the adjacent myocardium consistent with anemia. No pericardial effusion. Unremarkable distal thoracic esophagus. Hepatobiliary: There are 2 gastro hepatic biliary drainage catheters. One extends from the common bile duct into the gastric lumen, and the second extends from the proximal small bowel into the stomach. Scattered pneumobilia is present and not unexpected. There is persistent dilatation of the right posterior bile ducts. No definite discrete hepatic lesion. Pancreas: Sequelae of chronic pancreatitis with multiple small pancreatic calcifications. Dilatation of the main pancreatic duct is present. Spleen: Within normal limits Adrenals/Urinary Tract: Unremarkable adrenal glands. Nonspecific perinephric stranding bilaterally worse on the left than the right. No  hydronephrosis or nephrolithiasis. Unremarkable ureters and bladder. Stomach/Bowel: Colonic diverticular disease without CT evidence of active inflammation. Surgical changes of prior right hemicolectomy. Surgical  changes of prior partial gastrectomy. No evidence of a bowel obstruction or focal bowel wall thickening. Vascular/Lymphatic: Limited evaluation in the absence of intravenous contrast. Extensive atherosclerotic vascular calcifications. No aneurysm. Reproductive: Prostate is unremarkable. Other: No abdominal wall hernia or abnormality. No abdominopelvic ascites. Musculoskeletal: Chronic bilateral L5 pars defects. Mild grade 1 anterolisthesis of L5 on S1. Multilevel degenerative disc disease. No acute fracture or malalignment. IMPRESSION: 1. Two gastro hepatic biliary drainage catheters are present, 1 extending from the stomach into the common bile duct, and the second from the stomach into the proximal small bowel. There is expected pneumobilia, however the right posterior bile ducts are slightly dilated. It is unclear if this is chronic, or an interval change without prior imaging for comparison. Recommend clinical correlation for signs and symptoms of cholangitis. 2. Extensive surgical changes including prior partial gastric resection with gastroenteric anastomosis and prior right hemicolectomy. No evidence of bowel obstruction. 3. Colonic diverticular disease without CT evidence of active inflammation. 4. The intracardiac blood pool is hypodense relative to the adjacent myocardium consistent with anemia. 5. Sequelae of chronic pancreatitis with likely chronic dilatation of the main pancreatic duct. 6. Chronic bilateral L5 pars defects with mild grade 1 anterolisthesis of L5 on S1. 7.  Aortic Atherosclerosis (ICD10-170.0) Electronically Signed   By: Malachy Moan M.D.   On: 03/19/2018 13:55    EKG:  No orders found for this or any previous visit.  ASSESSMENT AND PLAN:   72 year old male with  past medical history significant for chronic pancreatitis status post abdominal surgery for the same, history of alcohol abuse presents to hospital secondary to nausea vomiting and abdominal pain  1.  Clinical acute pancreatitis-on top of chronic pancreatitis -Concern for alcohol use.  Need to verify with family. -Continue n.p.o. status, IV fluids -Pain and nausea medications -Lipase is improving  2.  Hypo-Magnesemia-being replaced  3.-Concern for alcohol withdrawals.  Will add Ativan and Haldol as needed. -AST elevation and thrombocytopenia consistent with the same.  4.  Hypertension-monitor at this time.  Coreg on hold  5.  DVT prophylaxis-history of prior DVT.  Currently on Xarelto  -monitor as platelets are dropping  Physical therapy once more alert.   All the records are reviewed and case discussed with Care Management/Social Workerr. Management plans discussed with the patient, family and they are in agreement.  CODE STATUS: Full Code  TOTAL TIME TAKING CARE OF THIS PATIENT: 38 minutes.   POSSIBLE D/C IN 2 DAYS, DEPENDING ON CLINICAL CONDITION.   Enid Baas M.D on 03/20/2018 at 11:47 AM  Between 7am to 6pm - Pager - [phone]  After 6pm go to [email or web address] - password [email or web address]  [facility]  Office  [phone]  CC: Primary care physician; Patient, [name] Per

## 2018-03-20 NOTE — Progress Notes (Signed)
Pt noted to be very anxious and restless, trying to get out of bed, PRN haldol given per orders

## 2018-03-21 ENCOUNTER — Inpatient Hospital Stay: Payer: Medicare Other

## 2018-03-21 LAB — TSH: TSH: 3.676 u[IU]/mL (ref 0.350–4.500)

## 2018-03-21 LAB — COMPREHENSIVE METABOLIC PANEL
ALBUMIN: 2.7 g/dL — AB (ref 3.5–5.0)
ALT: 34 U/L (ref 17–63)
AST: 84 U/L — AB (ref 15–41)
Alkaline Phosphatase: 63 U/L (ref 38–126)
Anion gap: 10 (ref 5–15)
BUN: 40 mg/dL — AB (ref 6–20)
CO2: 16 mmol/L — AB (ref 22–32)
Calcium: 8 mg/dL — ABNORMAL LOW (ref 8.9–10.3)
Chloride: 116 mmol/L — ABNORMAL HIGH (ref 101–111)
Creatinine, Ser: 1.91 mg/dL — ABNORMAL HIGH (ref 0.61–1.24)
GFR calc Af Amer: 39 mL/min — ABNORMAL LOW (ref >60)
GFR calc non Af Amer: 33 mL/min — ABNORMAL LOW (ref >60)
GLUCOSE: 42 mg/dL — AB (ref 65–99)
POTASSIUM: 4 mmol/L (ref 3.5–5.1)
SODIUM: 142 mmol/L (ref 135–145)
Total Bilirubin: 5.1 mg/dL — ABNORMAL HIGH (ref 0.3–1.2)
Total Protein: 6 g/dL — ABNORMAL LOW (ref 6.5–8.1)

## 2018-03-21 LAB — URINALYSIS, COMPLETE (UACMP) WITH MICROSCOPIC
BILIRUBIN URINE: NEGATIVE
Glucose, UA: NEGATIVE mg/dL
Ketones, ur: NEGATIVE mg/dL
Leukocytes, UA: NEGATIVE
Nitrite: NEGATIVE
Protein, ur: NEGATIVE mg/dL
Specific Gravity, Urine: 1.015 (ref 1.005–1.030)
Squamous Epithelial / LPF: NONE SEEN (ref 0–5)
pH: 5 (ref 5.0–8.0)

## 2018-03-21 LAB — CBC
HCT: 30.4 % — ABNORMAL LOW (ref 40.0–52.0)
HEMOGLOBIN: 9.6 g/dL — AB (ref 13.0–18.0)
MCH: 23 pg — AB (ref 26.0–34.0)
MCHC: 31.5 g/dL — ABNORMAL LOW (ref 32.0–36.0)
MCV: 73 fL — AB (ref 80.0–100.0)
Platelets: 48 10*3/uL — ABNORMAL LOW (ref 150–440)
RBC: 4.16 MIL/uL — AB (ref 4.40–5.90)
RDW: 22 % — ABNORMAL HIGH (ref 11.5–14.5)
WBC: 25.9 10*3/uL — ABNORMAL HIGH (ref 3.8–10.6)

## 2018-03-21 LAB — AMMONIA: Ammonia: 32 umol/L (ref 9–35)

## 2018-03-21 LAB — MAGNESIUM: MAGNESIUM: 2.1 mg/dL (ref 1.7–2.4)

## 2018-03-21 LAB — GLUCOSE, CAPILLARY
Glucose-Capillary: 109 mg/dL — ABNORMAL HIGH (ref 65–99)
Glucose-Capillary: 45 mg/dL — ABNORMAL LOW (ref 65–99)

## 2018-03-21 MED ORDER — SODIUM CHLORIDE 0.9 % IV SOLN
1.0000 g | Freq: Every day | INTRAVENOUS | Status: DC
  Administered 2018-03-21 – 2018-03-23 (×3): 1 g via INTRAVENOUS
  Filled 2018-03-21 (×4): qty 10

## 2018-03-21 MED ORDER — DEXTROSE 50 % IV SOLN
1.0000 | Freq: Once | INTRAVENOUS | Status: AC
  Administered 2018-03-21: 07:00:00 50 mL via INTRAVENOUS

## 2018-03-21 MED ORDER — SODIUM CHLORIDE 0.9% FLUSH
10.0000 mL | Freq: Two times a day (BID) | INTRAVENOUS | Status: DC
  Administered 2018-03-21 – 2018-03-27 (×10): 10 mL
  Administered 2018-03-28: 30 mL
  Administered 2018-03-29: 10 mL

## 2018-03-21 MED ORDER — DEXTROSE 50 % IV SOLN
INTRAVENOUS | Status: AC
  Filled 2018-03-21: qty 50

## 2018-03-21 MED ORDER — SODIUM CHLORIDE 0.9% FLUSH
10.0000 mL | INTRAVENOUS | Status: DC | PRN
  Administered 2018-03-22: 20 mL
  Filled 2018-03-21: qty 40

## 2018-03-21 MED ORDER — DEXTROSE-NACL 5-0.45 % IV SOLN
INTRAVENOUS | Status: DC
  Administered 2018-03-21 – 2018-03-26 (×8): via INTRAVENOUS

## 2018-03-21 MED ORDER — HALOPERIDOL LACTATE 5 MG/ML IJ SOLN
INTRAMUSCULAR | Status: AC
  Filled 2018-03-21: qty 1

## 2018-03-21 MED ORDER — HALOPERIDOL LACTATE 5 MG/ML IJ SOLN
1.0000 mg | Freq: Four times a day (QID) | INTRAMUSCULAR | Status: DC | PRN
  Administered 2018-03-21: 11:00:00 2 mg via INTRAVENOUS
  Administered 2018-03-22 (×2): 1 mg via INTRAVENOUS
  Filled 2018-03-21 (×2): qty 1

## 2018-03-21 NOTE — Progress Notes (Signed)
[employer] - [place] at [facility]   PATIENT NAME: Joe [name]    MR#:  [id number]  DATE OF BIRTH:  12/06/1945  SUBJECTIVE:  CHIEF COMPLAINT:   Chief Complaint  Patient presents with  . Emesis  . Diarrhea   -Not sedated, patient is very restless, thrashing his limbs -Sugars dropped this morning  REVIEW OF SYSTEMS:  Review of Systems  Unable to perform ROS: Mental status change    DRUG ALLERGIES:   Allergies  Allergen Reactions  . Morphine And Related Hives and Itching    VITALS:  Blood pressure 103/63, pulse 94, temperature 97.7 F (36.5 C), temperature source Oral, resp. rate 20, height 5\' 9"  (1.753 m), weight 65.8 kg (145 lb), SpO2 90 %.  PHYSICAL EXAMINATION:  Physical Exam   GENERAL:  72 y.o.-year-old patient lying in the bed with no acute distress.  EYES: Pupils equal, round, reactive to light and accommodation. No scleral icterus. Extraocular muscles intact.  HEENT: Head atraumatic, normocephalic. Oropharynx and nasopharynx clear.  NECK:  Supple, no jugular venous distention. No thyroid enlargement, no tenderness.  LUNGS: Normal breath sounds bilaterally, no wheezing, rales,rhonchi or crepitation. No use of accessory muscles of respiration.  CARDIOVASCULAR: S1, S2 normal. No murmurs, rubs, or gallops.  ABDOMEN: Soft, diffuse tenderness all over, nondistended. Bowel sounds present. No organomegaly or mass.  EXTREMITIES: No pedal edema, cyanosis, or clubbing.  NEUROLOGIC: Sedated, responding to tactile stimulation and agitated.  Able to move all extremities in bed PSYCHIATRIC: The patient is sedated.  SKIN: No obvious rash, lesion, or ulcer.    LABORATORY PANEL:   CBC Recent Labs  Lab 03/21/18 1001  WBC 25.9*  HGB 9.6*  HCT 30.4*  PLT PENDING   ------------------------------------------------------------------------------------------------------------------  Chemistries  Recent Labs  Lab 03/20/18 0336 03/21/18 0552  NA 140 142    K 3.8 4.0  CL 116* 116*  CO2 15* 16*  GLUCOSE 73 42*  BUN 25* 40*  CREATININE 1.95* 1.91*  CALCIUM 7.6* 8.0*  MG 1.3*  --   AST 87* 84*  ALT 28 34  ALKPHOS 78 63  BILITOT 4.3* 5.1*   ------------------------------------------------------------------------------------------------------------------  Cardiac Enzymes No results for input(s): TROPONINI in the last 168 hours. ------------------------------------------------------------------------------------------------------------------  RADIOLOGY:  Ct Abdomen Pelvis Wo Contrast  Result Date: 03/19/2018 CLINICAL DATA:  72 year old male with nausea, vomiting, diarrhea, abdominal and back pain. History of chronic pancreatitis. EXAM: CT ABDOMEN AND PELVIS WITHOUT CONTRAST TECHNIQUE: Multidetector CT imaging of the abdomen and pelvis was performed following the standard protocol without IV contrast. COMPARISON:  Prior CT scan of the chest 10/05/2009 FINDINGS: Lower chest: The visualized lower chest is clear. The intracardiac blood pool is hypodense relative to the adjacent myocardium consistent with anemia. No pericardial effusion. Unremarkable distal thoracic esophagus. Hepatobiliary: There are 2 gastro hepatic biliary drainage catheters. One extends from the common bile duct into the gastric lumen, and the second extends from the proximal small bowel into the stomach. Scattered pneumobilia is present and not unexpected. There is persistent dilatation of the right posterior bile ducts. No definite discrete hepatic lesion. Pancreas: Sequelae of chronic pancreatitis with multiple small pancreatic calcifications. Dilatation of the main pancreatic duct is present. Spleen: Within normal limits Adrenals/Urinary Tract: Unremarkable adrenal glands. Nonspecific perinephric stranding bilaterally worse on the left than the right. No hydronephrosis or nephrolithiasis. Unremarkable ureters and bladder. Stomach/Bowel: Colonic diverticular disease without CT  evidence of active inflammation. Surgical changes of prior right hemicolectomy. Surgical changes of prior partial gastrectomy. No  evidence of a bowel obstruction or focal bowel wall thickening. Vascular/Lymphatic: Limited evaluation in the absence of intravenous contrast. Extensive atherosclerotic vascular calcifications. No aneurysm. Reproductive: Prostate is unremarkable. Other: No abdominal wall hernia or abnormality. No abdominopelvic ascites. Musculoskeletal: Chronic bilateral L5 pars defects. Mild grade 1 anterolisthesis of L5 on S1. Multilevel degenerative disc disease. No acute fracture or malalignment. IMPRESSION: 1. Two gastro hepatic biliary drainage catheters are present, 1 extending from the stomach into the common bile duct, and the second from the stomach into the proximal small bowel. There is expected pneumobilia, however the right posterior bile ducts are slightly dilated. It is unclear if this is chronic, or an interval change without prior imaging for comparison. Recommend clinical correlation for signs and symptoms of cholangitis. 2. Extensive surgical changes including prior partial gastric resection with gastroenteric anastomosis and prior right hemicolectomy. No evidence of bowel obstruction. 3. Colonic diverticular disease without CT evidence of active inflammation. 4. The intracardiac blood pool is hypodense relative to the adjacent myocardium consistent with anemia. 5. Sequelae of chronic pancreatitis with likely chronic dilatation of the main pancreatic duct. 6. Chronic bilateral L5 pars defects with mild grade 1 anterolisthesis of L5 on S1. 7.  Aortic Atherosclerosis (ICD10-170.0) Electronically Signed   By: Malachy Moan M.D.   On: 03/19/2018 13:55   Dg Chest 1 View  Result Date: 03/20/2018 CLINICAL DATA:  Wheezing.  Slightly labored breathing. EXAM: CHEST  1 VIEW COMPARISON:  October 01, 2009 FINDINGS: Stents in the upper abdomen of uncertain etiology. A density over the  lateral right lung was noted to represent a sclerotic bone lesion in 2010. No other nodules or masses. The heart size is borderline to mildly enlarged. There is a torturous thoracic aorta again identified. The mediastinum is unchanged given portable technique. No pneumothorax. No focal infiltrate. IMPRESSION: No acute abnormality. Electronically Signed   By: Gerome Sam III M.D   On: 03/20/2018 14:43    EKG:  No orders found for this or any previous visit.  ASSESSMENT AND PLAN:   72 year old male with past medical history significant for chronic pancreatitis status post abdominal surgery for the same, history of alcohol abuse presents to hospital secondary to nausea vomiting and abdominal pain  1.  Clinical acute pancreatitis-on top of chronic pancreatitis -Concern for alcohol use.  Need to verify with family. -Abdomen is not tender on exam.  Receiving IV fluids -Start on clear liquid diet if able to tolerate -Pain and nausea medications -Lipase is improving  2.  Acute renal failure-ATN.  Receiving IV fluids.  Avoid nephrotoxins-  3.-Metabolic encephalopathy-concern for withdrawal. -CT of the head ordered -Haldol as needed for now  4.  Hypertension-monitor at this time.  Coreg on hold  5.  DVT prophylaxis-history of prior DVT.  Currently on Xarelto  -monitor as platelets are dropping  6.  Thrombocytopenia-known history of liver disease.  Platelets dropped from 120 to 50.  Hold Xarelto. -Follow-up levels today  Physical therapy once more alert.   All the records are reviewed and case discussed with Care Management/Social Workerr. Management plans discussed with the patient, family and they are in agreement.  CODE STATUS: Full Code  TOTAL TIME TAKING CARE OF THIS PATIENT: 35 minutes.   POSSIBLE D/C IN 2 DAYS, DEPENDING ON CLINICAL CONDITION.   Enid Baas M.D on 03/21/2018 at 11:09 AM  Between 7am to 6pm - Pager - [phone]  After 6pm go to [email or web address] -  password [email or web address]  [employer]  [phone]  CC: Primary care physician; Patient, [name] Per

## 2018-03-21 NOTE — Progress Notes (Signed)
CRITICAL VALUE ALERT  Critical Value:Glucose 45  Date & Time Notied:03/21/18 06:40 am  Provider Notified: Sheryle Hailiamond  Orders Received/Actions taken: D50 given to Patient

## 2018-03-22 ENCOUNTER — Inpatient Hospital Stay: Payer: Self-pay

## 2018-03-22 LAB — BASIC METABOLIC PANEL
Anion gap: 7 (ref 5–15)
BUN: 34 mg/dL — AB (ref 6–20)
CHLORIDE: 119 mmol/L — AB (ref 101–111)
CO2: 17 mmol/L — AB (ref 22–32)
CREATININE: 1.1 mg/dL (ref 0.61–1.24)
Calcium: 8.3 mg/dL — ABNORMAL LOW (ref 8.9–10.3)
GFR calc Af Amer: >60 mL/min (ref >60)
GFR calc non Af Amer: >60 mL/min (ref >60)
GLUCOSE: 103 mg/dL — AB (ref 65–99)
Potassium: 3.6 mmol/L (ref 3.5–5.1)
Sodium: 143 mmol/L (ref 135–145)

## 2018-03-22 LAB — LIPASE, BLOOD: Lipase: 21 U/L (ref 11–51)

## 2018-03-22 LAB — CBC
HCT: 27.5 % — ABNORMAL LOW (ref 40.0–52.0)
Hemoglobin: 8.9 g/dL — ABNORMAL LOW (ref 13.0–18.0)
MCH: 23.4 pg — AB (ref 26.0–34.0)
MCHC: 32.5 g/dL (ref 32.0–36.0)
MCV: 72 fL — AB (ref 80.0–100.0)
PLATELETS: 56 10*3/uL — AB (ref 150–440)
RBC: 3.82 MIL/uL — ABNORMAL LOW (ref 4.40–5.90)
RDW: 21.7 % — AB (ref 11.5–14.5)
WBC: 23 10*3/uL — ABNORMAL HIGH (ref 3.8–10.6)

## 2018-03-22 LAB — URINE CULTURE: Culture: NO GROWTH

## 2018-03-22 LAB — CK: Total CK: 144 U/L (ref 49–397)

## 2018-03-22 MED ORDER — CARVEDILOL 3.125 MG PO TABS
3.1250 mg | ORAL_TABLET | Freq: Two times a day (BID) | ORAL | Status: DC
  Administered 2018-03-22 – 2018-03-28 (×8): 3.125 mg via ORAL
  Filled 2018-03-22 (×10): qty 1

## 2018-03-22 MED ORDER — LORAZEPAM 2 MG/ML IJ SOLN
1.0000 mg | Freq: Four times a day (QID) | INTRAMUSCULAR | Status: DC | PRN
  Administered 2018-03-22 – 2018-03-23 (×3): 1 mg via INTRAVENOUS
  Filled 2018-03-22 (×3): qty 1

## 2018-03-22 MED ORDER — LABETALOL HCL 5 MG/ML IV SOLN
10.0000 mg | INTRAVENOUS | Status: DC | PRN
  Administered 2018-03-22: 10 mg via INTRAVENOUS
  Filled 2018-03-22: qty 4

## 2018-03-22 NOTE — Plan of Care (Signed)
Called pt's daughter, Ilean SkillStephanie Davis to let her know the nurse tech had been able to feed him, but he's unable to feed himself.  We set up a plan for  Tomorrow where she will let me know when she will be able to get here to feed him and I will have tray available.  We will cover whatever meals that family can't help with.

## 2018-03-22 NOTE — Care Management Important Message (Signed)
Copy of signed IM left with patient and family in room. 

## 2018-03-22 NOTE — Progress Notes (Signed)
[employer] - [place] at [facility]   PATIENT NAME: Joe McgregorWalter [name]    MR#:  [id number]  DATE OF BIRTH:  03/18/1946  SUBJECTIVE:  CHIEF COMPLAINT:   Chief Complaint  Patient presents with  . Emesis  . Diarrhea   - Patient slightly more alert, but still hallucinating  REVIEW OF SYSTEMS:  Review of Systems  Unable to perform ROS: Mental status change    DRUG ALLERGIES:   Allergies  Allergen Reactions  . Morphine And Related Hives and Itching    VITALS:  Blood pressure (!) 156/92, pulse 99, temperature 99 F (37.2 C), temperature source Oral, resp. rate 20, height 5\' 9"  (1.753 m), weight 65.8 kg (145 lb), SpO2 92 %.  PHYSICAL EXAMINATION:  Physical Exam   GENERAL:  72 y.o.-year-old patient lying in the bed with no acute distress.  EYES: Pupils equal, round, reactive to light and accommodation. No scleral icterus. Extraocular muscles intact.  HEENT: Head atraumatic, normocephalic. Oropharynx and nasopharynx clear.  NECK:  Supple, no jugular venous distention. No thyroid enlargement, no tenderness.  LUNGS: Normal breath sounds bilaterally, no wheezing, rales,rhonchi or crepitation. No use of accessory muscles of respiration.  CARDIOVASCULAR: S1, S2 normal. No murmurs, rubs, or gallops.  ABDOMEN: Soft, diffuse tenderness all over, nondistended. Bowel sounds present. No organomegaly or mass.  EXTREMITIES: No pedal edema, cyanosis, or clubbing.  NEUROLOGIC:  Moving around in bed, alert and able to tell his name and date of birth, hallucinating PSYCHIATRIC: The patient is intermittently alert and hallucinating.  SKIN: No obvious rash, lesion, or ulcer.    LABORATORY PANEL:   CBC Recent Labs  Lab 03/22/18 1010  WBC 23.0*  HGB 8.9*  HCT 27.5*  PLT 56*   ------------------------------------------------------------------------------------------------------------------  Chemistries  Recent Labs  Lab 03/21/18 0552 03/22/18 1010  NA 142 143  K 4.0 3.6    CL 116* 119*  CO2 16* 17*  GLUCOSE 42* 103*  BUN 40* 34*  CREATININE 1.91* 1.10  CALCIUM 8.0* 8.3*  MG 2.1  --   AST 84*  --   ALT 34  --   ALKPHOS 63  --   BILITOT 5.1*  --    ------------------------------------------------------------------------------------------------------------------  Cardiac Enzymes No results for input(s): TROPONINI in the last 168 hours. ------------------------------------------------------------------------------------------------------------------  RADIOLOGY:  Ct Head Wo Contrast  Result Date: 03/21/2018 CLINICAL DATA:  72 year old male with history of chronic pancreatitis and alcohol abuse presenting with nausea, vomiting and abdominal pain. Altered level of consciousness. Initial encounter. EXAM: CT HEAD WITHOUT CONTRAST TECHNIQUE: Contiguous axial images were obtained from the base of the skull through the vertex without intravenous contrast. COMPARISON:  No comparison head CT. FINDINGS: Brain: No intracranial hemorrhage or CT evidence of large acute infarct. Moderate chronic microvascular changes. Global atrophy. No intracranial mass lesion noted on this unenhanced exam. Vascular: Vascular calcifications Skull: Negative Sinuses/Orbits: No acute orbital abnormality. Minimal exophthalmos. Visualized paranasal sinuses are clear. Other: Mastoid air cells and middle ear cavities are clear. IMPRESSION: No acute intracranial abnormality. Moderate chronic microvascular changes. Global atrophy. Electronically Signed   By: Lacy DuverneySteven  Olson M.D.   On: 03/21/2018 11:45   [place] Venous Img Lower Bilateral  Result Date: 03/21/2018 CLINICAL DATA:  Bilateral lower extremity pain and swelling EXAM: BILATERAL LOWER EXTREMITY VENOUS DOPPLER ULTRASOUND TECHNIQUE: Gray-scale sonography with graded compression, as well as color Doppler and duplex ultrasound were performed to evaluate the lower extremity deep venous systems from the level of the common femoral vein and including the  common femoral, femoral, profunda femoral, popliteal and calf veins including the posterior tibial, peroneal and gastrocnemius veins when visible. The superficial great saphenous vein was also interrogated. Spectral Doppler was utilized to evaluate flow at rest and with distal augmentation maneuvers in the common femoral, femoral and popliteal veins. COMPARISON:  None. FINDINGS: RIGHT LOWER EXTREMITY Common Femoral Vein: No evidence of thrombus. Normal compressibility, respiratory phasicity and response to augmentation. Saphenofemoral Junction: No evidence of thrombus. Normal compressibility and flow on color Doppler imaging. Profunda Femoral Vein: No evidence of thrombus. Normal compressibility and flow on color Doppler imaging. Femoral Vein: No evidence of thrombus. Normal compressibility, respiratory phasicity and response to augmentation. Popliteal Vein: Thrombus is noted within which is nonocclusive in nature. Calf Veins: No evidence of thrombus. Normal compressibility and flow on color Doppler imaging. Superficial Great Saphenous Vein: No evidence of thrombus. Normal compressibility. Venous Reflux:  None. Other Findings:  None. LEFT LOWER EXTREMITY Common Femoral Vein: No evidence of thrombus. Normal compressibility, respiratory phasicity and response to augmentation. Saphenofemoral Junction: No evidence of thrombus. Normal compressibility and flow on color Doppler imaging. Profunda Femoral Vein: No evidence of thrombus. Normal compressibility and flow on color Doppler imaging. Femoral Vein: No evidence of thrombus. Normal compressibility, respiratory phasicity and response to augmentation. Popliteal Vein: No evidence of thrombus. Normal compressibility, respiratory phasicity and response to augmentation. Calf Veins: No evidence of thrombus. Normal compressibility and flow on color Doppler imaging. Superficial Great Saphenous Vein: No evidence of thrombus. Normal compressibility. Venous Reflux:  None. Other  Findings:  None. IMPRESSION: Deep venous thrombosis within the right popliteal vein which is nonocclusive in nature. Incidental note is made of arterial calcifications bilaterally. Electronically Signed   By: Alcide Clever M.D.   On: 03/21/2018 14:01   US Abdominal Pelvic Art/vent Flow Doppler Limited  Result Date: 03/21/2018 CLINICAL DATA:  Right upper quadrant abdominal pain. EXAM: ULTRASOUND ABDOMEN LIMITED RIGHT UPPER QUADRANT COMPARISON:  CT scan of March 19, 2018. FINDINGS: Gallbladder: Status post cholecystectomy. Common bile duct: Diameter: 3.4 mm which is within normal limits. Catheter is noted within common bile duct consistent with gastrohepatic biliary drainage catheter as described on prior CT scan. Liver: No focal lesion identified. Within normal limits in parenchymal echogenicity. 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 IMPRESSION: Status post cholecystectomy. Gastrohepatic biliary drainage catheter is noted within common bile duct. Thrombosis of main portal vein is noted. Possible varices are noted in porta hepatis region suggesting collaterals. Electronically Signed   By: Lupita Raider, M.D.   On: 03/21/2018 14:07   US Abdomen Limited Ruq  Result Date: 03/21/2018 CLINICAL DATA:  Right upper quadrant abdominal pain. EXAM: ULTRASOUND ABDOMEN LIMITED RIGHT UPPER QUADRANT COMPARISON:  CT scan of March 19, 2018. FINDINGS: Gallbladder: Status post cholecystectomy. Common bile duct: Diameter: 3.4 mm which is within normal limits. Catheter is noted within common bile duct consistent with gastrohepatic biliary drainage catheter as described on prior CT scan. Liver: No focal lesion identified. Within normal limits in parenchymal echogenicity. 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 IMPRESSION: Status post cholecystectomy. Gastrohepatic biliary drainage catheter is noted within common bile duct. Thrombosis of  main portal vein is noted. Possible varices are noted in porta hepatis region suggesting collaterals. Electronically Signed   By: Lupita Raider, M.D.   On: 03/21/2018 14:07    EKG:  No orders found for this or any previous visit.  ASSESSMENT AND PLAN:   72 year old male with past medical history significant for chronic pancreatitis status post abdominal surgery for the same, history of alcohol abuse presents to hospital secondary to nausea vomiting and abdominal pain  1.  Clinical acute pancreatitis-on top of chronic pancreatitis -Concern for alcohol use.  Family denies any recent use in 2 years. -Abdomen is not tender on exam.  Receiving IV fluids -advance to regular diet -Pain and nausea medications -Lipase is normalized.  2.  Acute renal failure-ATN.  Receiving IV fluids.  Avoid nephrotoxins-  3.-Metabolic encephalopathy-concern for withdrawal. -CT of the head without any acute findings -Haldol as needed for now and ativan  4.  Hypertension-monitor at this time.  Coreg on hold  5.  DVT prophylaxis-history of prior DVT.  - xarelto held due to thrombocytopenia - repeat dopplers with non occlusive clot in right popliteal vein and also portal vein  6.  Thrombocytopenia-known history of liver disease.  Platelets dropped from 120 to 50.  Hold Xarelto. -concern for sepsis  7.Sepsis- ? From UTI, or intra abdominal - cultures negative so far - continue rocephin- started on 03/21/18- wbc with slight improvement   Physical therapy once more alert.   All the records are reviewed and case discussed with Care Management/Social Workerr. Management plans discussed with the patient, family and they are in agreement.  CODE STATUS: Full Code  TOTAL TIME TAKING CARE OF THIS PATIENT: 36 minutes.   POSSIBLE D/C IN 2 DAYS, DEPENDING ON CLINICAL CONDITION.   Enid Baas M.D on 03/22/2018 at 3:27 PM  Between 7am to 6pm - Pager - [phone]  After 6pm go to [email or web address] -  password [email or web address]  [employer] [facility]  Office  [phone]  CC: Primary care physician; Patient, [name] Per

## 2018-03-22 NOTE — Progress Notes (Signed)
SWOT reviewed for MEWS of 4. Patient daughter in the room denies recent ETOH abuse x 2 years. Patient lives with his brother and daughter Joe [name](Joe [name]) arranges medications and doctor visits. Notified lab for need to collect AM labs. Order in place to collect labs at foot if needed. Daughter to stay until labs drawn. Xarelto on hold until platelets improve. 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 Patient confused lying comfortably in bed at this time. +mild expiratory wheezes. Patient is a current smoker though daughter denies it is daily.

## 2018-03-22 NOTE — Care Management (Signed)
Patient presents from home with vomiting and iarrhea.  Experiencing some confusion and agitation which is concerning from withdrawal sx but family maintaining patient has no access to to alcohol or other substances.  Starting on diet today.

## 2018-03-22 NOTE — Care Management (Deleted)
Patient presents form home.  Lives with her daughter.  Chronic pain. Admitted with UTI and urine cultures are pending.  Patient is followed by [facility].  has chronic foley.  Left message with [facility] to confirm services that are being provided.  Physical therapy has recommended outpatient therapy if patient wishes to pursue.  At present she is not sure.   

## 2018-03-23 ENCOUNTER — Inpatient Hospital Stay: Payer: Medicare Other

## 2018-03-23 ENCOUNTER — Inpatient Hospital Stay (HOSPITAL_COMMUNITY)
Admit: 2018-03-23 | Discharge: 2018-03-23 | Disposition: A | Discharge status: Home / Self Care | Payer: Medicare Other | Attending: Internal Medicine | Admitting: Internal Medicine

## 2018-03-23 DIAGNOSIS — G9340 Encephalopathy, unspecified: Secondary | ICD-10-CM

## 2018-03-23 DIAGNOSIS — I5030 Unspecified diastolic (congestive) heart failure: Secondary | ICD-10-CM

## 2018-03-23 LAB — BLOOD GAS, ARTERIAL
ACID-BASE DEFICIT: 5 mmol/L — AB (ref 0.0–2.0)
Acid-base deficit: 2.6 mmol/L — ABNORMAL HIGH (ref 0.0–2.0)
BICARBONATE: 18.6 mmol/L — AB (ref 20.0–28.0)
Bicarbonate: 19.5 mmol/L — ABNORMAL LOW (ref 20.0–28.0)
FIO2: 0.28
FIO2: 0.28
O2 Saturation: 97.6 %
O2 Saturation: 97.5 %
PATIENT TEMPERATURE: 37
PH ART: 7.5 — AB (ref 7.350–7.450)
Patient temperature: 37
pCO2 arterial: 28 mmHg — ABNORMAL LOW (ref 32.0–48.0)
pCO2 arterial: 25 mmHg — ABNORMAL LOW (ref 32.0–48.0)
pH, Arterial: 7.43 (ref 7.350–7.450)
pO2, Arterial: 95 mmHg (ref 83.0–108.0)
pO2, Arterial: 88 mmHg (ref 83.0–108.0)

## 2018-03-23 LAB — BASIC METABOLIC PANEL
Anion gap: 7 (ref 5–15)
BUN: 21 mg/dL — ABNORMAL HIGH (ref 6–20)
CALCIUM: 8.6 mg/dL — AB (ref 8.9–10.3)
CO2: 19 mmol/L — ABNORMAL LOW (ref 22–32)
CREATININE: 0.81 mg/dL (ref 0.61–1.24)
Chloride: 115 mmol/L — ABNORMAL HIGH (ref 101–111)
Glucose, Bld: 85 mg/dL (ref 65–99)
Potassium: 4.3 mmol/L (ref 3.5–5.1)
Sodium: 141 mmol/L (ref 135–145)

## 2018-03-23 LAB — CBC
HCT: 33.1 % — ABNORMAL LOW (ref 40.0–52.0)
Hemoglobin: 10.4 g/dL — ABNORMAL LOW (ref 13.0–18.0)
MCH: 23 pg — ABNORMAL LOW (ref 26.0–34.0)
MCHC: 31.4 g/dL — AB (ref 32.0–36.0)
MCV: 73.4 fL — ABNORMAL LOW (ref 80.0–100.0)
Platelets: 61 10*3/uL — ABNORMAL LOW (ref 150–440)
RBC: 4.5 MIL/uL (ref 4.40–5.90)
RDW: 22 % — AB (ref 11.5–14.5)
WBC: 18.6 10*3/uL — ABNORMAL HIGH (ref 3.8–10.6)

## 2018-03-23 LAB — C DIFFICILE QUICK SCREEN W PCR REFLEX
C DIFFICLE (CDIFF) ANTIGEN: NEGATIVE
C Diff interpretation: NOT DETECTED
C Diff toxin: NEGATIVE

## 2018-03-23 LAB — GLUCOSE, CAPILLARY
GLUCOSE-CAPILLARY: 184 mg/dL — AB (ref 65–99)
GLUCOSE-CAPILLARY: 63 mg/dL — AB (ref 65–99)
GLUCOSE-CAPILLARY: 75 mg/dL (ref 65–99)
GLUCOSE-CAPILLARY: 80 mg/dL (ref 65–99)

## 2018-03-23 LAB — MRSA PCR SCREENING: MRSA BY PCR: POSITIVE — AB

## 2018-03-23 LAB — LACTIC ACID, PLASMA: LACTIC ACID, VENOUS: 1.3 mmol/L (ref 0.5–1.9)

## 2018-03-23 MED ORDER — HYDRALAZINE HCL 20 MG/ML IJ SOLN: 10.0000 mg | Freq: Four times a day (QID) | INTRAMUSCULAR | Status: DC | PRN

## 2018-03-23 MED ORDER — SODIUM CHLORIDE 0.9% FLUSH
10.0000 mL | INTRAVENOUS | Status: DC | PRN
  Filled 2018-03-23: qty 40

## 2018-03-23 MED ORDER — ASPIRIN 300 MG RE SUPP
300.0000 mg | Freq: Every day | RECTAL | Status: DC
  Filled 2018-03-23 (×2): qty 1

## 2018-03-23 MED ORDER — HYDRALAZINE HCL 20 MG/ML IJ SOLN
10.0000 mg | Freq: Four times a day (QID) | INTRAMUSCULAR | Status: DC | PRN
  Administered 2018-03-23 – 2018-03-27 (×2): 10 mg via INTRAVENOUS
  Filled 2018-03-23 (×2): qty 1

## 2018-03-23 MED ORDER — SODIUM CHLORIDE 0.9% FLUSH
10.0000 mL | Freq: Two times a day (BID) | INTRAVENOUS | Status: DC
  Administered 2018-03-23 – 2018-03-24 (×2): 10 mL
  Administered 2018-03-24: 20 mL
  Administered 2018-03-25 – 2018-03-28 (×6): 10 mL
  Administered 2018-03-28: 30 mL
  Administered 2018-03-29: 10 mL

## 2018-03-23 MED ORDER — GADOBENATE DIMEGLUMINE 529 MG/ML IV SOLN
15.0000 mL | Freq: Once | INTRAVENOUS | Status: AC | PRN
  Administered 2018-03-23: 14:00:00 13 mL via INTRAVENOUS

## 2018-03-23 MED ORDER — DEXMEDETOMIDINE HCL IN NACL 400 MCG/100ML IV SOLN
0.4000 ug/kg/h | INTRAVENOUS | Status: DC
  Administered 2018-03-23: 0.4 ug/kg/h via INTRAVENOUS
  Administered 2018-03-24: 0.2 ug/kg/h via INTRAVENOUS
  Filled 2018-03-23 (×3): qty 100

## 2018-03-23 MED ORDER — METHYLPREDNISOLONE SODIUM SUCC 125 MG IJ SOLR
60.0000 mg | INTRAMUSCULAR | Status: DC
  Administered 2018-03-23 – 2018-03-24 (×2): 60 mg via INTRAVENOUS
  Filled 2018-03-23 (×2): qty 2

## 2018-03-23 MED ORDER — IPRATROPIUM-ALBUTEROL 0.5-2.5 (3) MG/3ML IN SOLN
3.0000 mL | RESPIRATORY_TRACT | Status: DC
  Administered 2018-03-23 – 2018-03-25 (×12): 3 mL via RESPIRATORY_TRACT
  Filled 2018-03-23 (×12): qty 3

## 2018-03-23 MED ORDER — IOPAMIDOL (ISOVUE-370) INJECTION 76%
75.0000 mL | Freq: Once | INTRAVENOUS | Status: AC | PRN
  Administered 2018-03-23: 02:00:00 75 mL via INTRAVENOUS

## 2018-03-23 NOTE — Plan of Care (Signed)
  Problem: Education: Goal: Knowledge of General Education information will improve Outcome: Progressing   Problem: Education: Goal: Knowledge of Pancreatitis treatment and prevention will improve Outcome: Progressing   Problem: Health Behavior/Discharge Planning: Goal: Ability to formulate a plan to maintain an alcohol-free life will improve Outcome: Progressing   Problem: Nutritional: Goal: Ability to achieve adequate nutritional intake will improve Outcome: Progressing   Problem: Clinical Measurements: Goal: Complications related to the disease process, condition or treatment will be avoided or minimized Outcome: Progressing

## 2018-03-23 NOTE — Progress Notes (Signed)
[employer] [facility] [facility] [place] [facility]   PATIENT NAME: Joe [name]    MR#:  [id number]  DATE OF BIRTH:  June 04, 1946  SUBJECTIVE:  CHIEF COMPLAINT:   Chief Complaint  Patient presents with  . Emesis  . Diarrhea   - Patient appears very tachypneic-last night had a ABG and CT chest which were not very remarkable -Continues to be confused and agitated this morning  REVIEW OF SYSTEMS:  Review of Systems  Unable to perform ROS: Mental status change    DRUG ALLERGIES:   Allergies  Allergen Reactions  . Morphine And Related Hives and Itching    VITALS:  Blood pressure (!) 125/103, pulse (!) 127, temperature 98.2 F (36.8 C), temperature source Oral, resp. rate (!) 34, height 5\' 9"  (1.753 m), weight 65.8 kg (145 lb), SpO2 100 %.  PHYSICAL EXAMINATION:  Physical Exam   GENERAL:  72 y.o.-year-old patient lying in the bed, very restless.  EYES: Pupils equal, round, reactive to light and accommodation. No scleral icterus. Extraocular muscles intact.  HEENT: Head atraumatic, normocephalic. Oropharynx and nasopharynx clear.  NECK:  Supple, no jugular venous distention. No thyroid enlargement, no tenderness.  LUNGS: Normal breath sounds bilaterally, noted to have upper airway wheezing, no rales,rhonchi or crepitation.  Using accessory muscles of respiration.  Decreased bibasilar breath sounds.  Tachypneic. CARDIOVASCULAR: S1, S2 normal. No murmurs, rubs, or gallops.  ABDOMEN: Soft, diffuse tenderness all over, nondistended. Bowel sounds present. No organomegaly or mass.  EXTREMITIES: No pedal edema, cyanosis, or clubbing.  NEUROLOGIC:  Moving around in bed, alert and able to tell his name but confused.  PSYCHIATRIC: The patient is intermittently alert and hallucinating.  SKIN: No obvious rash, lesion, or ulcer.    LABORATORY PANEL:   CBC Recent Labs  Lab 03/23/18 0850  WBC 18.6*  HGB 10.4*  HCT 33.1*  PLT 61*    ------------------------------------------------------------------------------------------------------------------  Chemistries  Recent Labs  Lab 03/21/18 0552  03/23/18 0850  NA 142   < > 141  K 4.0   < > 4.3  CL 116*   < > 115*  CO2 16*   < > 19*  GLUCOSE 42*   < > 85  BUN 40*   < > 21*  CREATININE 1.91*   < > 0.81  CALCIUM 8.0*   < > 8.6*  MG 2.1  --   --   AST 84*  --   --   ALT 34  --   --   ALKPHOS 63  --   --   BILITOT 5.1*  --   --    < > = values in this interval not displayed.   ------------------------------------------------------------------------------------------------------------------  Cardiac Enzymes No results for input(s): TROPONINI in the last 168 hours. ------------------------------------------------------------------------------------------------------------------  RADIOLOGY:  Ct Angio Chest Pe W Or Wo Contrast  Result Date: 03/23/2018 CLINICAL DATA:  Acute onset of recurrent pancreatitis. Nausea, vomiting and generalized abdominal pain. Personal history of deep venous thrombosis. Assess for pulmonary embolus. EXAM: CT ANGIOGRAPHY CHEST WITH CONTRAST TECHNIQUE: Multidetector CT imaging of the chest was performed using the standard protocol during bolus administration of intravenous contrast. Multiplanar CT image reconstructions and MIPs were obtained to evaluate the vascular anatomy. CONTRAST:  75mL ISOVUE-370 IOPAMIDOL (ISOVUE-370) INJECTION 76% COMPARISON:  CT of the chest performed 10/05/2009 FINDINGS: Cardiovascular:  There is no evidence of pulmonary embolus. The heart is normal in size. The thoracic aorta demonstrates scattered calcification. The great vessels are grossly unremarkable in appearance. Mediastinum/Nodes: The  mediastinum is grossly unremarkable. No mediastinal lymphadenopathy is seen. No pericardial effusion is identified. The visualized portions of the thyroid gland are unremarkable. No axillary lymphadenopathy is seen. Lungs/Pleura: Small  right and trace left pleural effusions are noted, with associated atelectasis. Partial opacification of the bronchioles to the lower lobes may reflect debris or possibly aspiration. No pneumothorax is seen. No masses are identified. Upper Abdomen: The visualized portions of the liver and spleen are grossly unremarkable. The patient is status post cholecystectomy, with trace air noted at the gallbladder fossa. Two catheters are noted extending between the stomach and gallbladder fossa. Would correlate clinically as to the type of prior surgery, as the pancreas is still seen, with scattered pancreatic calcifications likely reflect sequelae of chronic pancreatitis. Musculoskeletal: No acute osseous abnormalities are identified. The visualized musculature is unremarkable in appearance. Review of the MIP images confirms the above findings. IMPRESSION: 1. No evidence of pulmonary embolus. 2. Small right and trace left pleural effusions, with associated atelectasis. Partial opacification of the bronchioles to the lower lung lobes may reflect debris or possibly aspiration. 3. Two catheters noted extending between the stomach and gallbladder fossa. Would correlate clinically as to the type of prior surgery, as the pancreas is still present. Trace air noted at the gallbladder fossa, of uncertain significance. 4. Scattered pancreatic calcifications likely reflect sequelae of chronic pancreatitis. Aortic Atherosclerosis (ICD10-I70.0). Electronically Signed   By: Roanna RaiderJeffery  Chang M.D.   On: 03/23/2018 02:51   [place] Venous Img Lower Bilateral  Result Date: 03/21/2018 CLINICAL DATA:  Bilateral lower extremity pain and swelling EXAM: BILATERAL LOWER EXTREMITY VENOUS DOPPLER ULTRASOUND TECHNIQUE: Gray-scale sonography with graded compression, as well as color Doppler and duplex ultrasound were performed to evaluate the lower extremity deep venous systems from the level of the common femoral vein and including the common femoral,  femoral, profunda femoral, popliteal and calf veins including the posterior tibial, peroneal and gastrocnemius veins when visible. The superficial great saphenous vein was also interrogated. Spectral Doppler was utilized to evaluate flow at rest and with distal augmentation maneuvers in the common femoral, femoral and popliteal veins. COMPARISON:  None. FINDINGS: RIGHT LOWER EXTREMITY Common Femoral Vein: No evidence of thrombus. Normal compressibility, respiratory phasicity and response to augmentation. Saphenofemoral Junction: No evidence of thrombus. Normal compressibility and flow on color Doppler imaging. Profunda Femoral Vein: No evidence of thrombus. Normal compressibility and flow on color Doppler imaging. Femoral Vein: No evidence of thrombus. Normal compressibility, respiratory phasicity and response to augmentation. Popliteal Vein: Thrombus is noted within which is nonocclusive in nature. Calf Veins: No evidence of thrombus. Normal compressibility and flow on color Doppler imaging. Superficial Great Saphenous Vein: No evidence of thrombus. Normal compressibility. Venous Reflux:  None. Other Findings:  None. LEFT LOWER EXTREMITY Common Femoral Vein: No evidence of thrombus. Normal compressibility, respiratory phasicity and response to augmentation. Saphenofemoral Junction: No evidence of thrombus. Normal compressibility and flow on color Doppler imaging. Profunda Femoral Vein: No evidence of thrombus. Normal compressibility and flow on color Doppler imaging. Femoral Vein: No evidence of thrombus. Normal compressibility, respiratory phasicity and response to augmentation. Popliteal Vein: No evidence of thrombus. Normal compressibility, respiratory phasicity and response to augmentation. Calf Veins: No evidence of thrombus. Normal compressibility and flow on color Doppler imaging. Superficial Great Saphenous Vein: No evidence of thrombus. Normal compressibility. Venous Reflux:  None. Other Findings:  None.  IMPRESSION: Deep venous thrombosis within the right popliteal vein which is nonocclusive in nature. Incidental note is made of arterial calcifications bilaterally.  Electronically Signed   By: Alcide Clever M.D.   On: 03/21/2018 14:01   US Abdominal Pelvic Art/vent Flow Doppler Limited  Result Date: 03/21/2018 CLINICAL DATA:  Right upper quadrant abdominal pain. EXAM: ULTRASOUND ABDOMEN LIMITED RIGHT UPPER QUADRANT COMPARISON:  CT scan of March 19, 2018. FINDINGS: Gallbladder: Status post cholecystectomy. Common bile duct: Diameter: 3.4 mm which is within normal limits. Catheter is noted within common bile duct consistent with gastrohepatic biliary drainage catheter as described on prior CT scan. Liver: No focal lesion identified. Within normal limits in parenchymal echogenicity. 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 IMPRESSION: Status post cholecystectomy. Gastrohepatic biliary drainage catheter is noted within common bile duct. Thrombosis of main portal vein is noted. Possible varices are noted in porta hepatis region suggesting collaterals. Electronically Signed   By: Lupita Raider, M.D.   On: 03/21/2018 14:07   [place] Ekg Site Rite  Result Date: 03/22/2018 If Site Rite image not attached, placement could not be confirmed due to current cardiac rhythm.  US Abdomen Limited Ruq  Result Date: 03/21/2018 CLINICAL DATA:  Right upper quadrant abdominal pain. EXAM: ULTRASOUND ABDOMEN LIMITED RIGHT UPPER QUADRANT COMPARISON:  CT scan of March 19, 2018. FINDINGS: Gallbladder: Status post cholecystectomy. Common bile duct: Diameter: 3.4 mm which is within normal limits. Catheter is noted within common bile duct consistent with gastrohepatic biliary drainage catheter as described on prior CT scan. Liver: No focal lesion identified. Within normal limits in parenchymal echogenicity. 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  IMPRESSION: Status post cholecystectomy. Gastrohepatic biliary drainage catheter is noted within common bile duct. Thrombosis of main portal vein is noted. Possible varices are noted in porta hepatis region suggesting collaterals. Electronically Signed   By: Lupita Raider, M.D.   On: 03/21/2018 14:07    EKG:  No orders found for this or any previous visit.  ASSESSMENT AND PLAN:   72 year old male with past medical history significant for chronic pancreatitis status post abdominal surgery for the same, history of alcohol abuse presents to hospital secondary to nausea vomiting and abdominal pain  1.  Acute respiratory distress-with tachypnea and hypoxia -Respiratory rate in the 30s and sats 100% on 2 L now -CT chest with no acute findings, bibasilar atelectasis and mild pleural effusion noted. -Continue supplemental oxygen.  ABG with worsening respiratory alkalosis -We will transfer to stepdown unit  2.-Metabolic encephalopathy- initial concern for withdrawal. But per daughters hasn't been drinking alcohol in 2 years -CT of the head without any acute findings -Haldol  and ativan prn -Still not improving.  Ammonia is within normal limits.  Will get MRI of the brain.  Could also be causing his respiratory symptoms -Neurology consult after the MRI  3.  Hypertension-unable to take oral medications.  Will start hydralazine PRN  4.  Sepsis-likely from UTI.  Currently on Rocephin.  Check lactic acid.  Improving WBC  5.  DVT prophylaxis-history of prior DVT.  - xarelto held due to thrombocytopenia - repeat dopplers with non occlusive clot in right popliteal vein and also portal vein -Continue teds and SCDs  6.  Thrombocytopenia-known history of liver disease.  Platelets dropped from 120 to 50.  Hold Xarelto. -concern for sepsis   Physical therapy once more alert. Transfer to ICU today Palliative care consult for goals of care   All the records are reviewed and case discussed with Care  Management/Social Workerr.  Management plans discussed with the patient, family and they are in agreement.  CODE STATUS: Full Code  TOTAL CRITICAL CARE TIME SPENT IN TAKING CARE OF THIS PATIENT: 45 minutes.   POSSIBLE D/C IN 2 DAYS, DEPENDING ON CLINICAL CONDITION.   Dakiya Puopolo M.D on 03/23/2018 at 1:31 PM  Between 7am to 6pm - Pager - [phone]  After 6pm go to [email or web address] - password [email or web address]  [facility]  Office  [phone]  CC: Primary care physician; Patient, [name] Per

## 2018-03-23 NOTE — Progress Notes (Signed)
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

## 2018-03-23 NOTE — Consult Note (Signed)
Reason for Consult:Tachypnea Referring Physician: Dr. Seward [name] is an 72 y.o. male.  HPI: Joe [name] is a 72 year old gentleman with a past medical history of chronic pancreatitis, status post Whipple's procedure,hypertension,deep venous thrombosis, presently Xarelto, presented with nausea, vomiting and abdominal pain.along with confusion and encephalopathy. CT scan of the abdomen was performed which revealed postsurgical Whipple changes.he was noted to be hypoglycemic, non-anion gap metabolic acidosis, leukocytosis at 25.9. MRI of the brain revealed acute left basal ganglia non-hemorrhagic stroke along with old right MCA and follow-up, distribution lesions. Patient has remained confused and encephalopathic throughout hospitalization. His tachypnea has progressed, and his morning gas revealed a pH of 7.5, PCO2 of 25 and PO2 of 88. He is being moved to the intensive care unit/stepdown unit for further evaluation and management his most recent white count is 18.6, BUN is 21 and creatinine 0.81, CO2 has increased to 19  Past Medical History:  Diagnosis Date  . Pancreatitis   . Patient denies medical problems     Past Surgical History:  Procedure Laterality Date  . WHIPPLE PROCEDURE      Family History  Problem Relation Age of Onset  . Brain cancer Mother   . Heart disease Brother     Social History:  reports that he has been smoking cigarettes.  He has a 25.00 pack-year smoking history. He does not have any smokeless tobacco history on file. He reports that he drank alcohol. He reports that he has current or past drug history.  Allergies:  Allergies  Allergen Reactions  . Morphine And Related Hives and Itching    Medications: I have reviewed the patient's current medications.  Results for orders placed or performed during the hospital encounter of 03/19/18 (from the past 48 hour(s))  Basic metabolic panel     Status: Abnormal   Collection Time: 03/22/18 10:10 AM  Result  Value Ref Range   Sodium 143 135 - 145 mmol/L   Potassium 3.6 3.5 - 5.1 mmol/L   Chloride 119 (H) 101 - 111 mmol/L   CO2 17 (L) 22 - 32 mmol/L   Glucose, Bld 103 (H) 65 - 99 mg/dL   BUN 34 (H) 6 - 20 mg/dL   Creatinine, Ser 1.10 0.61 - 1.24 mg/dL   Calcium 8.3 (L) 8.9 - 10.3 mg/dL   GFR calc non Af Amer >60 >60 mL/min   GFR calc Af Amer >60 >60 mL/min    Comment: (NOTE) The eGFR has been calculated using the CKD EPI equation. This calculation has not been validated in all clinical situations. eGFR's persistently <60 mL/min signify possible Chronic Kidney Disease.    Anion gap 7 5 - 15    Comment: Performed at [facility], [address] [place], [place] [address]  CBC     Status: Abnormal   Collection Time: 03/22/18 10:10 AM  Result Value Ref Range   WBC 23.0 (H) 3.8 - 10.6 K/uL   RBC 3.82 (L) 4.40 - 5.90 MIL/uL   Hemoglobin 8.9 (L) 13.0 - 18.0 g/dL   HCT 27.5 (L) 40.0 - 52.0 %   MCV 72.0 (L) 80.0 - 100.0 fL   MCH 23.4 (L) 26.0 - 34.0 pg   MCHC 32.5 32.0 - 36.0 g/dL   RDW 21.7 (H) 11.5 - 14.5 %   Platelets 56 (L) 150 - 440 K/uL    Comment: PLATELET COUNT CONFIRMED BY SMEAR Performed at [facility], [address] [place],  [address]   Lipase, blood  Status: None   Collection Time: 03/22/18 10:10 AM  Result Value Ref Range   Lipase 21 11 - 51 U/L    Comment: Performed at [facility], [address] [place], [place] [address]  CK     Status: None   Collection Time: 03/22/18 10:10 AM  Result Value Ref Range   Total CK 144 49 - 397 U/L    Comment: Performed at [facility], [address] [place], [place] [address]  Blood gas, arterial     Status: Abnormal   Collection Time: 03/23/18  1:29 AM  Result Value Ref Range   FIO2 0.28    Delivery systems NASAL CANNULA     Comment: 2L   pH, Arterial 7.43 7.350 - 7.450   pCO2 arterial 28 (L) 32.0 - 48.0 mmHg   pO2, Arterial 95 83.0 - 108.0 mmHg   Bicarbonate 18.6 (L)  20.0 - 28.0 mmol/L   Acid-base deficit 5.0 (H) 0.0 - 2.0 mmol/L   O2 Saturation 97.6 %   Patient temperature 37.0    Collection site RIGHT RADIAL    Sample type ARTERIAL DRAW    Allens test (pass/fail) PASS PASS    Comment: Performed at [facility], [address] [place], [place] [address]  C difficile quick scan w PCR reflex     Status: None   Collection Time: 03/23/18  7:36 AM  Result Value Ref Range   C Diff antigen NEGATIVE NEGATIVE   C Diff toxin NEGATIVE NEGATIVE   C Diff interpretation No C. difficile detected.     Comment: Performed at [facility], [address] [place], [place] [address]  CBC     Status: Abnormal   Collection Time: 03/23/18  8:50 AM  Result Value Ref Range   WBC 18.6 (H) 3.8 - 10.6 K/uL   RBC 4.50 4.40 - 5.90 MIL/uL   Hemoglobin 10.4 (L) 13.0 - 18.0 g/dL   HCT 33.1 (L) 40.0 - 52.0 %   MCV 73.4 (L) 80.0 - 100.0 fL   MCH 23.0 (L) 26.0 - 34.0 pg   MCHC 31.4 (L) 32.0 - 36.0 g/dL   RDW 22.0 (H) 11.5 - 14.5 %   Platelets 61 (L) 150 - 440 K/uL    Comment: PLATELET COUNT CONFIRMED BY SMEAR Performed at [facility], [address] [place], [place] [address]   Basic metabolic panel     Status: Abnormal   Collection Time: 03/23/18  8:50 AM  Result Value Ref Range   Sodium 141 135 - 145 mmol/L   Potassium 4.3 3.5 - 5.1 mmol/L   Chloride 115 (H) 101 - 111 mmol/L   CO2 19 (L) 22 - 32 mmol/L   Glucose, Bld 85 65 - 99 mg/dL   BUN 21 (H) 6 - 20 mg/dL   Creatinine, Ser 0.81 0.61 - 1.24 mg/dL   Calcium 8.6 (L) 8.9 - 10.3 mg/dL   GFR calc non Af Amer >60 >60 mL/min   GFR calc Af Amer >60 >60 mL/min    Comment: (NOTE) The eGFR has been calculated using the CKD EPI equation. This calculation has not been validated in all clinical situations. eGFR's persistently <60 mL/min signify possible Chronic Kidney Disease.    Anion gap 7 5 - 15    Comment: Performed at [facility], [address] [place], [place] [address]   Blood gas, arterial     Status: Abnormal   Collection Time: 03/23/18 11:51 AM  Result  Value Ref Range   FIO2 0.28    Delivery systems NASAL CANNULA    pH, Arterial 7.50 (H) 7.350 - 7.450   pCO2 arterial 25 (L) 32.0 - 48.0 mmHg   pO2, Arterial 88 83.0 - 108.0 mmHg   Bicarbonate 19.5 (L) 20.0 - 28.0 mmol/L   Acid-base deficit 2.6 (H) 0.0 - 2.0 mmol/L   O2 Saturation 97.5 %   Patient temperature 37.0    Collection site LEFT BRACHIAL    Sample type ARTERIAL DRAW    Allens test (pass/fail) NOT APPLICABLE (A) PASS    Comment: Performed at [facility], [address] [place], [place] [address]  Glucose, capillary     Status: None   Collection Time: 03/23/18 11:53 AM  Result Value Ref Range   Glucose-Capillary 80 65 - 99 mg/dL  Lactic acid, plasma     Status: None   Collection Time: 03/23/18  3:04 PM  Result Value Ref Range   Lactic Acid, Venous 1.3 0.5 - 1.9 mmol/L    Comment: Performed at [facility], [address] [place], [place] [address]    Ct Angio Chest Pe W Or Wo Contrast  Result Date: 03/23/2018 CLINICAL DATA:  Acute onset of recurrent pancreatitis. Nausea, vomiting and generalized abdominal pain. Personal history of deep venous thrombosis. Assess for pulmonary embolus. EXAM: CT ANGIOGRAPHY CHEST WITH CONTRAST TECHNIQUE: Multidetector CT imaging of the chest was performed using the standard protocol during bolus administration of intravenous contrast. Multiplanar CT image reconstructions and MIPs were obtained to evaluate the vascular anatomy. CONTRAST:  17m ISOVUE-370 IOPAMIDOL (ISOVUE-370) INJECTION 76% COMPARISON:  CT of the chest performed 10/05/2009 FINDINGS: Cardiovascular:  There is no evidence of pulmonary embolus. The heart is normal in size. The thoracic aorta demonstrates scattered calcification. The great vessels are grossly unremarkable in appearance. Mediastinum/Nodes: The mediastinum is grossly unremarkable. No mediastinal lymphadenopathy is  seen. No pericardial effusion is identified. The visualized portions of the thyroid gland are unremarkable. No axillary lymphadenopathy is seen. Lungs/Pleura: Small right and trace left pleural effusions are noted, with associated atelectasis. Partial opacification of the bronchioles to the lower lobes may reflect debris or possibly aspiration. No pneumothorax is seen. No masses are identified. Upper Abdomen: The visualized portions of the liver and spleen are grossly unremarkable. The patient is status post cholecystectomy, with trace air noted at the gallbladder fossa. Two catheters are noted extending between the stomach and gallbladder fossa. Would correlate clinically as to the type of prior surgery, as the pancreas is still seen, with scattered pancreatic calcifications likely reflect sequelae of chronic pancreatitis. Musculoskeletal: No acute osseous abnormalities are identified. The visualized musculature is unremarkable in appearance. Review of the MIP images confirms the above findings. IMPRESSION: 1. No evidence of pulmonary embolus. 2. Small right and trace left pleural effusions, with associated atelectasis. Partial opacification of the bronchioles to the lower lung lobes may reflect debris or possibly aspiration. 3. Two catheters noted extending between the stomach and gallbladder fossa. Would correlate clinically as to the type of prior surgery, as the pancreas is still present. Trace air noted at the gallbladder fossa, of uncertain significance. 4. Scattered pancreatic calcifications likely reflect sequelae of chronic pancreatitis. Aortic Atherosclerosis (ICD10-I70.0). Electronically Signed   By: JGarald BaldingM.D.   On: 03/23/2018 02:51   Mr BJeri CosWGNContrast  Result Date: 03/23/2018 CLINICAL DATA:  Initial evaluation for acute altered mental status, unexplained EXAM: MRI HEAD WITHOUT AND WITH CONTRAST TECHNIQUE: Multiplanar, multiecho pulse sequences  of the brain and surrounding structures  were obtained without and with intravenous contrast. CONTRAST:  7m MULTIHANCE GADOBENATE DIMEGLUMINE 529 MG/ML IV SOLN COMPARISON:  Prior CT from 03/24/2018. FINDINGS: Brain: Study moderately degraded by motion artifact. Diffuse prominence of the CSF containing spaces compatible with generalized cerebral atrophy. Confluent T2/FLAIR hyperintensity present within the periventricular and deep white matter both cerebral hemispheres most consistent with chronic small vessel ischemic change, advanced in nature. Scatter remote small volume right MCA territory cortical infarcts noted involving the right frontal and parietal lobes. Remote right thalamic lacunar infarct noted. 7 mm acute ischemic infarct involving the left caudate, best appreciated on coronal DWI sequence (series 101, image 28). No associated hemorrhage or mass effect. No other evidence for acute or subacute ischemia. Gray-white matter differentiation chronic intracranial hemorrhage. No mass lesion, midline shift or mass effect. No hydrocephalus. No extra-axial fluid collection. No abnormal enhancement. Major dural sinuses grossly patent. Pituitary gland suprasellar region grossly normal. Maintained. No evidence for acute or Vascular: Major intracranial vascular flow voids grossly maintained at the skull base. Skull and upper cervical spine: Craniocervical junction not well evaluated on this motion degraded exam. Bone marrow signal intensity grossly normal. No scalp soft tissue abnormality. Sinuses/Orbits: Globes and orbital soft tissues grossly unremarkable. Paranasal sinuses largely clear. No appreciable mastoid effusion. Other: None. IMPRESSION: 1. 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 2. 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 Electronically Signed   By: BJeannine BogaM.D.   On: 03/23/2018 14:48   UKoreaEkg Site Rite  Result  Date: 03/22/2018 If Site Rite image not attached, placement could not be confirmed due to current cardiac rhythm.   ROS Blood pressure (!) 125/103, pulse (!) 127, temperature 98.2 F (36.8 C), temperature source Oral, resp. rate (!) 34, height _0  (1.753 m), weight 145 lb (65.8 kg), SpO2 100 %. Physical Exam  Elderly appearing African-American male, encephalopathic, confused, tremulous Vital signs: Please see the above listed vital signs HEENT: Trachea midline, no thyromegaly noted, increased respiratory rate with accessory muscle utilization Cardiovascular: Tachycardia appreciated Pulmonary: Coarse rhonchi noted Abdominal: Tender to palpation, prior surgical changes noted Extremities: No clubbing cyanosis or edema noted Neurologic: Patient moves all extremities  Assessment/Plan: History of chronic pancreatitis, status post Whipple's procedure,admitted for nausea vomiting abdominal pain. Has had supportive care, rehydration, pain and nausea control, treated empirically with Rocephin. Respiratory status has worsened with increasing tachypnea, respiratory alkalosis. CT scan of the chest performed did not reveal any acute pulmonary emboli and only showed some mild bibasilar atelectasis with effusions. Patient will be moved to stepdown unit for closer monitoring  Encephalopathy. MRI did reveal acute left basal ganglia stroke nonhemorrhagic. [facility] and guidance on [facility]  Prior history of DVT, patient has been on Xarelto. Being held for thrombocytopenia. If unable to resume will need to consider filter placement, ultrasound did reveal nonocclusive right popliteal deep venous thrombosis  Hypertension. Patient unable to take oral medications being treated with when necessary IV dosing    Joe [name] 03/23/2018, 4:10 PM

## 2018-03-23 NOTE — Progress Notes (Addendum)
Went to pt room to place PICC, patient in MRI. Floor RN awaare, will update IV consult when pt back to the unit.

## 2018-03-23 NOTE — Progress Notes (Signed)
Peripherally Inserted Central Catheter/Midline Placement  The IV Nurse has discussed with the patient and/or persons authorized to consent for the patient, the purpose of this procedure and the potential benefits and risks involved with this procedure.  The benefits include less needle sticks, lab draws from the catheter, and the patient may be discharged home with the catheter. Risks include, but not limited to, infection, bleeding, blood clot (thrombus formation), and puncture of an artery; nerve damage and irregular heartbeat and possibility to perform a PICC exchange if needed/ordered by physician.  Alternatives to this procedure were also discussed.  Bard Power PICC patient education guide, fact sheet on infection prevention and patient information card has been provided to patient /or left at bedside.    PICC/Midline Placement Documentation  PICC Double Lumen 03/23/18 PICC Left Brachial 42 cm 0 cm (Active)  Indication for Insertion or Continuance of Line Poor Vasculature-patient has had multiple peripheral attempts or PIVs lasting less than 24 hours 03/23/2018  4:41 PM  Exposed Catheter (cm) 0 cm 03/23/2018  4:41 PM  Site Assessment Clean;Dry;Intact 03/23/2018  4:41 PM  Lumen #1 Status Flushed;Blood return noted 03/23/2018  4:41 PM  Lumen #2 Status Flushed;Blood return noted 03/23/2018  4:41 PM  Dressing Type Transparent 03/23/2018  4:41 PM  Dressing Status Clean;Dry;Intact;Antimicrobial disc in place 03/23/2018  4:41 PM  Dressing Intervention New dressing 03/23/2018  4:41 PM  Dressing Change Due 03/30/18 03/23/2018  4:41 PM   Daughter signed consent at bedside    Maximino GreenlandLumban, Kato Wieczorek Albarece 03/23/2018, 4:42 PM

## 2018-03-23 NOTE — Plan of Care (Signed)
Third day w/patient found him more unstable and lethargic.  Patient was tachypnic and elvated BP.  ABG showed some alkalosis.  Blood sugar supported by D5.45NS - pt has had very poor PO intake.  Pt was Cdiff negative.  CT of chest shows small R&L pleural effusion w/ assoc. Atelectasis - No PE.  MRI shows stroke.  New PICC inserted - pt is a very difficult stick. Platelets improved to 61 - have been hold xarelto.  Pt wasn't able to take coreg this am, but was just able to drink OJ for low BS.  Palliative consult ordered.

## 2018-03-24 ENCOUNTER — Encounter: Payer: Self-pay | Admitting: *Deleted

## 2018-03-24 DIAGNOSIS — Z515 Encounter for palliative care: Secondary | ICD-10-CM

## 2018-03-24 DIAGNOSIS — K859 Acute pancreatitis without necrosis or infection, unspecified: Principal | ICD-10-CM

## 2018-03-24 DIAGNOSIS — Z7189 Other specified counseling: Secondary | ICD-10-CM

## 2018-03-24 DIAGNOSIS — R0902 Hypoxemia: Secondary | ICD-10-CM

## 2018-03-24 DIAGNOSIS — G9341 Metabolic encephalopathy: Secondary | ICD-10-CM

## 2018-03-24 DIAGNOSIS — K861 Other chronic pancreatitis: Secondary | ICD-10-CM

## 2018-03-24 DIAGNOSIS — R1900 Intra-abdominal and pelvic swelling, mass and lump, unspecified site: Secondary | ICD-10-CM

## 2018-03-24 DIAGNOSIS — R1011 Right upper quadrant pain: Secondary | ICD-10-CM

## 2018-03-24 LAB — CBC WITH DIFFERENTIAL/PLATELET
Basophils Absolute: 0 10*3/uL (ref 0–0.1)
Basophils Relative: 0 %
EOS ABS: 0 10*3/uL (ref 0–0.7)
EOS PCT: 0 %
HCT: 30.1 % — ABNORMAL LOW (ref 40.0–52.0)
HEMOGLOBIN: 9.8 g/dL — AB (ref 13.0–18.0)
LYMPHS ABS: 0.4 10*3/uL — AB (ref 1.0–3.6)
LYMPHS PCT: 6 %
MCH: 23.6 pg — AB (ref 26.0–34.0)
MCHC: 32.6 g/dL (ref 32.0–36.0)
MCV: 72.2 fL — AB (ref 80.0–100.0)
MONOS PCT: 16 %
Monocytes Absolute: 1.1 10*3/uL — ABNORMAL HIGH (ref 0.2–1.0)
Neutro Abs: 5.2 10*3/uL (ref 1.4–6.5)
Neutrophils Relative %: 78 %
PLATELETS: 41 10*3/uL — AB (ref 150–440)
RBC: 4.17 MIL/uL — AB (ref 4.40–5.90)
RDW: 22.1 % — ABNORMAL HIGH (ref 11.5–14.5)
WBC: 6.7 10*3/uL (ref 3.8–10.6)

## 2018-03-24 LAB — BASIC METABOLIC PANEL
ANION GAP: 5 (ref 5–15)
BUN: 24 mg/dL — AB (ref 6–20)
CHLORIDE: 115 mmol/L — AB (ref 101–111)
CO2: 19 mmol/L — ABNORMAL LOW (ref 22–32)
Calcium: 8 mg/dL — ABNORMAL LOW (ref 8.9–10.3)
Creatinine, Ser: 0.74 mg/dL (ref 0.61–1.24)
Glucose, Bld: 216 mg/dL — ABNORMAL HIGH (ref 65–99)
POTASSIUM: 3.7 mmol/L (ref 3.5–5.1)
SODIUM: 139 mmol/L (ref 135–145)

## 2018-03-24 LAB — GLUCOSE, CAPILLARY
GLUCOSE-CAPILLARY: 173 mg/dL — AB (ref 65–99)
GLUCOSE-CAPILLARY: 142 mg/dL — AB (ref 65–99)
Glucose-Capillary: 188 mg/dL — ABNORMAL HIGH (ref 65–99)

## 2018-03-24 LAB — CBC
HCT: 29.3 % — ABNORMAL LOW (ref 40.0–52.0)
HEMOGLOBIN: 9.7 g/dL — AB (ref 13.0–18.0)
MCH: 23.7 pg — AB (ref 26.0–34.0)
MCHC: 33 g/dL (ref 32.0–36.0)
MCV: 71.8 fL — ABNORMAL LOW (ref 80.0–100.0)
PLATELETS: 45 10*3/uL — AB (ref 150–440)
RBC: 4.07 MIL/uL — AB (ref 4.40–5.90)
RDW: 21.4 % — ABNORMAL HIGH (ref 11.5–14.5)
WBC: 6.8 10*3/uL (ref 3.8–10.6)

## 2018-03-24 LAB — PROTIME-INR
INR: 1.73
Prothrombin Time: 20.1 seconds — ABNORMAL HIGH (ref 11.4–15.2)

## 2018-03-24 LAB — APTT: aPTT: 51 seconds — ABNORMAL HIGH (ref 24–36)

## 2018-03-24 LAB — BILIRUBIN, TOTAL: Total Bilirubin: 2.8 mg/dL — ABNORMAL HIGH (ref 0.3–1.2)

## 2018-03-24 MED ORDER — MUPIROCIN 2 % EX OINT
1.0000 "application " | TOPICAL_OINTMENT | Freq: Two times a day (BID) | CUTANEOUS | Status: AC
  Administered 2018-03-24 – 2018-03-28 (×10): 1 via NASAL
  Filled 2018-03-24 (×2): qty 22

## 2018-03-24 MED ORDER — SODIUM CHLORIDE 0.9 % IV SOLN: 250.0000 mL | INTRAVENOUS | Status: DC | PRN

## 2018-03-24 MED ORDER — CHLORHEXIDINE GLUCONATE CLOTH 2 % EX PADS
6.0000 | MEDICATED_PAD | Freq: Every day | CUTANEOUS | Status: AC
  Administered 2018-03-24 – 2018-03-27 (×4): 6 via TOPICAL

## 2018-03-24 MED ORDER — SODIUM CHLORIDE 0.9% FLUSH: 3.0000 mL | INTRAVENOUS | Status: DC | PRN

## 2018-03-24 MED ORDER — ARGATROBAN 50 MG/50ML IV SOLN
0.5000 ug/kg/min | INTRAVENOUS | Status: DC
  Administered 2018-03-24: 0.5 ug/kg/min via INTRAVENOUS
  Administered 2018-03-24: 2 ug/kg/min via INTRAVENOUS
  Administered 2018-03-26: 0.5 ug/kg/min via INTRAVENOUS
  Filled 2018-03-24 (×6): qty 50

## 2018-03-24 MED ORDER — PANTOPRAZOLE SODIUM 40 MG IV SOLR
40.0000 mg | Freq: Every day | INTRAVENOUS | Status: DC
  Administered 2018-03-24 – 2018-03-25 (×2): 40 mg via INTRAVENOUS
  Filled 2018-03-24 (×2): qty 40

## 2018-03-24 MED ORDER — SODIUM CHLORIDE 0.9% FLUSH
3.0000 mL | Freq: Two times a day (BID) | INTRAVENOUS | Status: DC
  Administered 2018-03-24 – 2018-03-29 (×8): 3 mL via INTRAVENOUS

## 2018-03-24 NOTE — Progress Notes (Signed)
Ultrasound called, stated that Carotid Doppler will be performed on patient in the morning (03/25/2018). MD- Dr. Sung AmabileSimonds- made aware

## 2018-03-24 NOTE — Consult Note (Signed)
Reason for Consult:confusino  Referring Physician: Dr. Lonn Georgiaonforti  CC: stroke/confusion  HPI: Joe [name] is an 72 y.o. male with a past medical history of chronic pancreatitis, status post Whipple's procedure,hypertension,deep venous thrombosis, presently Xarelto, presented with nausea, vomiting and abdominal pain.along with confusion and encephalopathy. Neurology consulted due to L BG small vessel disease infarct.     Past Medical History:  Diagnosis Date  . Pancreatitis   . Patient denies medical problems     Past Surgical History:  Procedure Laterality Date  . WHIPPLE PROCEDURE      Family History  Problem Relation Age of Onset  . Brain cancer Mother   . Heart disease Brother     Social History:  reports that he has been smoking cigarettes.  He has a 25.00 pack-year smoking history. He does not have any smokeless tobacco history on file. He reports that he drank alcohol. He reports that he has current or past drug history.  Allergies  Allergen Reactions  . Morphine And Related Hives and Itching    Medications: I have reviewed the patient's current medications.  ROS: Unable to obtain due to confusion   Physical Examination: Blood pressure 139/80, pulse 72, temperature 98.1 F (36.7 C), temperature source Oral, resp. rate 18, height 5\' 5"  (1.651 m), weight 149 lb 11.1 oz (67.9 kg), SpO2 96 %. Pt is moving all her extremities symmetrically but not purposeful Does not follow commands.  No clear weakness on L side.   Laboratory Studies:   Basic Metabolic Panel: Recent Labs  Lab 03/19/18 1149 03/20/18 0336 03/21/18 0552 03/22/18 1010 03/23/18 0850 03/24/18 0635  NA 137 140 142 143 141 139  K 3.2* 3.8 4.0 3.6 4.3 3.7  CL 110 116* 116* 119* 115* 115*  CO2 15* 15* 16* 17* 19* 19*  GLUCOSE 85 73 42* 103* 85 216*  BUN 16 25* 40* 34* 21* 24*  CREATININE 1.04 1.95* 1.91* 1.10 0.81 0.74  CALCIUM 8.4* 7.6* 8.0* 8.3* 8.6* 8.0*  MG 1.3* 1.3* 2.1  --   --   --      Liver Function Tests: Recent Labs  Lab 03/19/18 1149 03/20/18 0336 03/21/18 0552  AST 52* 87* 84*  ALT 15* 28 34  ALKPHOS 100 78 63  BILITOT 2.9* 4.3* 5.1*  PROT 6.4* 5.5* 6.0*  ALBUMIN 3.0* 2.4* 2.7*   Recent Labs  Lab 03/19/18 1149 03/20/18 0336 03/22/18 1010  LIPASE 312* 76* 21   Recent Labs  Lab 03/21/18 1226  AMMONIA 32    CBC: Recent Labs  Lab 03/19/18 1149  03/21/18 1001 03/22/18 1010 03/23/18 0850 03/24/18 0635 03/24/18 1019  WBC 4.9   < > 25.9* 23.0* 18.6* 6.8 6.7  NEUTROABS 4.5  --   --   --   --   --  5.2  HGB 10.7*   < > 9.6* 8.9* 10.4* 9.7* 9.8*  HCT 33.2*   < > 30.4* 27.5* 33.1* 29.3* 30.1*  MCV 74.5*   < > 73.0* 72.0* 73.4* 71.8* 72.2*  PLT 129*   < > 48* 56* 61* 45* 41*   < > = values in this interval not displayed.    Cardiac Enzymes: Recent Labs  Lab 03/22/18 1010  CKTOTAL 144    BNP: Invalid input(s): POCBNP  CBG: Recent Labs  Lab 03/23/18 1706 03/23/18 1731 03/23/18 2352 03/24/18 0557 03/24/18 1319  GLUCAP 63* 75 184* 188* 173*    Microbiology: Results for orders placed or performed during the hospital encounter of  03/19/18  CULTURE, BLOOD (ROUTINE X 2) w Reflex to ID Panel     Status: None (Preliminary result)   Collection Time: 03/21/18 12:26 PM  Result Value Ref Range Status   Specimen Description BLOOD RIGHT MID LINE  Final   Special Requests   Final    BOTTLES DRAWN AEROBIC AND ANAEROBIC Blood Culture adequate volume   Culture   Final    NO GROWTH 3 DAYS Performed at [facility], [address] [place], [place] [address]    Report Status PENDING  Incomplete  CULTURE, BLOOD (ROUTINE X 2) w Reflex to ID Panel     Status: None (Preliminary result)   Collection Time: 03/21/18 12:29 PM  Result Value Ref Range Status   Specimen Description BLOOD LEFT ANTECUBITAL  Final   Special Requests   Final    BOTTLES DRAWN AEROBIC AND ANAEROBIC Blood Culture adequate volume   Culture   Final    NO GROWTH 3  DAYS Performed at [facility], [address], [place], [place] [address]    Report Status PENDING  Incomplete  Urine Culture     Status: None   Collection Time: 03/21/18 12:46 PM  Result Value Ref Range Status   Specimen Description   Final    URINE, RANDOM Performed at [facility], [address], [place], [place] [address]    Special Requests   Final    NONE Performed at [facility], [address], [place], [place] [address]    Culture   Final    NO GROWTH Performed at [facility], [address], [place], [place] [address]    Report Status 03/22/2018 FINAL  Final  C difficile quick scan w PCR reflex     Status: None   Collection Time: 03/23/18  7:36 AM  Result Value Ref Range Status   C Diff antigen NEGATIVE NEGATIVE Final   C Diff toxin NEGATIVE NEGATIVE Final   C Diff interpretation No C. difficile detected.  Final    Comment: Performed at [facility], [address] [place], [place] [address]  MRSA PCR Screening     Status: Abnormal   Collection Time: 03/23/18  5:46 PM  Result Value Ref Range Status   MRSA by PCR POSITIVE (A) NEGATIVE Final    Comment:        The GeneXpert MRSA Assay (FDA approved for NASAL specimens only), is one component of a comprehensive MRSA colonization surveillance program. It is not intended to diagnose MRSA infection nor to guide or monitor treatment for MRSA infections. RESULT CALLED TO, READ BACK BY AND VERIFIED WITH: BARBARA THAO ON 03/23/18 AT 1942 JAG Performed at [facility], [address] [place], [place] [address]     Coagulation Studies: No results for input(s): LABPROT, INR in the last 72 hours.  Urinalysis:  Recent Labs  Lab 03/20/18 1246  COLORURINE AMBER*  LABSPEC 1.015  PHURINE 5.0  GLUCOSEU NEGATIVE  HGBUR LARGE*  BILIRUBINUR NEGATIVE  KETONESUR NEGATIVE  PROTEINUR NEGATIVE  NITRITE NEGATIVE  LEUKOCYTESUR NEGATIVE    Lipid Panel:  No  results found for: CHOL, TRIG, HDL, CHOLHDL, VLDL, LDLCALC  HgbA1C: No results found for: HGBA1C  Urine Drug Screen:  No results found for: LABOPIA, COCAINSCRNUR, LABBENZ, AMPHETMU, THCU, LABBARB  Alcohol Level: No results for input(s): ETH in the last 168 hours.   Imaging: Ct Angio Chest Pe W Or Wo Contrast  Result Date: 03/23/2018 CLINICAL DATA:  Acute  onset of recurrent pancreatitis. Nausea, vomiting and generalized abdominal pain. Personal history of deep venous thrombosis. Assess for pulmonary embolus. EXAM: CT ANGIOGRAPHY CHEST WITH CONTRAST TECHNIQUE: Multidetector CT imaging of the chest was performed using the standard protocol during bolus administration of intravenous contrast. Multiplanar CT image reconstructions and MIPs were obtained to evaluate the vascular anatomy. CONTRAST:  75mL ISOVUE-370 IOPAMIDOL (ISOVUE-370) INJECTION 76% COMPARISON:  CT of the chest performed 10/05/2009 FINDINGS: Cardiovascular:  There is no evidence of pulmonary embolus. The heart is normal in size. The thoracic aorta demonstrates scattered calcification. The great vessels are grossly unremarkable in appearance. Mediastinum/Nodes: The mediastinum is grossly unremarkable. No mediastinal lymphadenopathy is seen. No pericardial effusion is identified. The visualized portions of the thyroid gland are unremarkable. No axillary lymphadenopathy is seen. Lungs/Pleura: Small right and trace left pleural effusions are noted, with associated atelectasis. Partial opacification of the bronchioles to the lower lobes may reflect debris or possibly aspiration. No pneumothorax is seen. No masses are identified. Upper Abdomen: The visualized portions of the liver and spleen are grossly unremarkable. The patient is status post cholecystectomy, with trace air noted at the gallbladder fossa. Two catheters are noted extending between the stomach and gallbladder fossa. Would correlate clinically as to the type of prior surgery, as the  pancreas is still seen, with scattered pancreatic calcifications likely reflect sequelae of chronic pancreatitis. Musculoskeletal: No acute osseous abnormalities are identified. The visualized musculature is unremarkable in appearance. Review of the MIP images confirms the above findings. IMPRESSION: 1. No evidence of pulmonary embolus. 2. Small right and trace left pleural effusions, with associated atelectasis. Partial opacification of the bronchioles to the lower lung lobes may reflect debris or possibly aspiration. 3. Two catheters noted extending between the stomach and gallbladder fossa. Would correlate clinically as to the type of prior surgery, as the pancreas is still present. Trace air noted at the gallbladder fossa, of uncertain significance. 4. Scattered pancreatic calcifications likely reflect sequelae of chronic pancreatitis. Aortic Atherosclerosis (ICD10-I70.0). Electronically Signed   By: Roanna Raider M.D.   On: 03/23/2018 02:51   Mr Laqueta Jean ZO Contrast  Result Date: 03/23/2018 CLINICAL DATA:  Initial evaluation for acute altered mental status, unexplained EXAM: MRI HEAD WITHOUT AND WITH CONTRAST TECHNIQUE: Multiplanar, multiecho pulse sequences of the brain and surrounding structures were obtained without and with intravenous contrast. CONTRAST:  13mL MULTIHANCE GADOBENATE DIMEGLUMINE 529 MG/ML IV SOLN COMPARISON:  Prior CT from 03/24/2018. FINDINGS: Brain: Study moderately degraded by motion artifact. Diffuse prominence of the CSF containing spaces compatible with generalized cerebral atrophy. Confluent T2/FLAIR hyperintensity present within the periventricular and deep white matter both cerebral hemispheres most consistent with chronic small vessel ischemic change, advanced in nature. Scatter remote small volume right MCA territory cortical infarcts noted involving the right frontal and parietal lobes. Remote right thalamic lacunar infarct noted. 7 mm acute ischemic infarct involving the left  caudate, best appreciated on coronal DWI sequence (series 101, image 28). No associated hemorrhage or mass effect. No other evidence for acute or subacute ischemia. Gray-white matter differentiation chronic intracranial hemorrhage. No mass lesion, midline shift or mass effect. No hydrocephalus. No extra-axial fluid collection. No abnormal enhancement. Major dural sinuses grossly patent. Pituitary gland suprasellar region grossly normal. Maintained. No evidence for acute or Vascular: Major intracranial vascular flow voids grossly maintained at the skull base. Skull and upper cervical spine: Craniocervical junction not well evaluated on this motion degraded exam. Bone marrow signal intensity grossly normal. No scalp soft tissue abnormality. Sinuses/Orbits:  Globes and orbital soft tissues grossly unremarkable. Paranasal sinuses largely clear. No appreciable mastoid effusion. Other: None. IMPRESSION: 1. 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 2. 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 Electronically Signed   By: Rise Mu M.D.   On: 03/23/2018 14:48   Dg Chest Port 1 View  Result Date: 03/23/2018 CLINICAL DATA: Increasing shortness of breath. EXAM: PORTABLE CHEST 1 VIEW COMPARISON:  Chest radiograph March 20, 2018 FINDINGS: Cardiomediastinal silhouette is normal. Calcified aortic arch. No pleural effusions or focal consolidations. Trachea projects midline and there is no pneumothorax. Soft tissue planes and included osseous structures are non-suspicious. Left PICC distal tip projects in mid superior vena cava. Pigtailed catheters in upper abdomen. IMPRESSION: No acute cardiopulmonary process. Left PICC distal tip projecting mid superior vena cava. No pneumothorax. Electronically Signed   By: Awilda Metro M.D.   On: 03/23/2018 19:07   [place] Ekg Site Rite  Result Date: 03/22/2018 If Site  Rite image not attached, placement could not be confirmed due to current cardiac rhythm.    Assessment/Plan:  72 y.o. male with a past medical history of chronic pancreatitis, status post Whipple's procedure,hypertension,deep venous thrombosis, presently Xarelto, presented with nausea, vomiting and abdominal pain.along with confusion and encephalopathy. Neurology consulted due to L BG small vessel disease infarct.    - I am not convinced the current exam is completely explained by the stroke which is due to small vessel disease - I think its metabolic encephalopathy due to above medical problems along with delirium - Unable to anticoagulate due to thrombocytopenia - s/p discussion with family  Pauletta Browns    03/24/2018, 1:34 PM

## 2018-03-24 NOTE — Progress Notes (Signed)
ANTICOAGULATION CONSULT NOTE - Initial Consult  Pharmacy Consult for argatroban Indication: DVT  Allergies  Allergen Reactions  . 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 Morphine And Related Hives and Itching    Patient Measurements: Height: 5\' 5"  (165.1 cm) Weight: 149 lb 11.1 oz (67.9 kg) IBW/kg (Calculated) : 61.5  Vital Signs: Temp: 98.1 F (36.7 C) (06/06 1300) Temp Source: Oral (06/06 1300) BP: 148/89 (06/06 1700) Pulse Rate: 81 (06/06 1700)  Labs: Recent Labs    03/22/18 1010 03/23/18 0850 03/24/18 0635 03/24/18 1019  HGB 8.9* 10.4* 9.7* 9.8*  HCT 27.5* 33.1* 29.3* 30.1*  PLT 56* 61* 45* 41*  CREATININE 1.10 0.81 0.74  --   CKTOTAL 144  --   --   --     Estimated Creatinine Clearance: 72.6 mL/min (by C-G formula based on SCr of 0.74 mg/dL).   Medical History: Past Medical History:  Diagnosis Date  . Pancreatitis   . Patient denies medical problems     Medications:  Scheduled:  . aspirin  300 mg Rectal Daily  . carvedilol  3.125 mg Oral BID WC  . Chlorhexidine Gluconate Cloth  6 each Topical Q0600  . ipratropium-albuterol  3 mL Nebulization Q4H  . methylPREDNISolone (SOLU-MEDROL) injection  60 mg Intravenous Q24H  . mupirocin ointment  1 application Nasal BID  . pantoprazole (PROTONIX) IV  40 mg Intravenous Daily  . sodium chloride flush  10-40 mL Intracatheter Q12H  . sodium chloride flush  10-40 mL Intracatheter Q12H  . 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 A follow-up aPTT is ordered for 3 hours after initiation of drip and will be checked every 3 hours until there are 2 consecutive levels within target range, then daily thereafter. When  labs are available to evaluate the Child-Pugh score we will confirm that it is low, as expected   Lowella Bandyodney D Abdulrahim Siddiqi, PharmD 03/24/2018,6:18 PM

## 2018-03-24 NOTE — Consult Note (Signed)
Consultation Note Date: 03/24/2018   Patient Name: Joe [name]  DOB: [birth year]  MRN: [id number]  Age / Sex: 72 y.o., male  PCP: Patient, [name] Per Referring Physician: Fritzi Mandes, MD  Reason for Consultation: Establishing goals of care  HPI/Patient Profile: 72 y.o. male admitted on 03/19/2018 from home with nausea, vomiting, and abdominal pain. He has a past medical history significant for chronic pancreatitis, s/p whipple surgery, essential hypertension, and DVT. On presentation to ER patient is somewhat confused and therefore most of the history was obtained from the daughter at the bedside.  Patient resides with his brother who noted that the patient was having multiple episodes of nausea and vomiting prior to arrival.  He then became increasingly weak and could not get out of bed and therefore he called EMS. In the ER patient continued to complain of generalized abdominal pain but no episodes of nausea or vomiting.  Patient's blood work was consistent with mild acute pancreatitis.  His CT scan of the abdomen pelvis showed postsurgical changes from his previous Whipple procedure but no evidence of acute pancreatitis. Patient denied any fever, cough, congestion, melena, hematochezia, or any other associated symptoms presently. 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 Palliative Medicine team consulted for goals of care discussion.   Clinical Assessment and Goals of Care: I have reviewed medical records including lab results, imaging, Epic notes, and MAR, received report from the bedside RN, and assessed the patient. I then met at the bedside with his daughter, Jory Sims [facility]) to discuss diagnosis prognosis, [place], EOL wishes, disposition and options. Patient is asleep, however when awaken is disoriented. He is unable to  participate in goals of care discussion with his daughter and I.   I introduced Palliative Medicine as specialized medical care for people living with serious illness. It focuses on providing relief from the symptoms and stress of a serious illness. The goal is to improve quality of life for both the patient and the family.  We discussed a brief life review of the patient. 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 People in the community knows him as "[employer]." He is a man of [place]. 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 He has an 8th grade education level. His daughter states he is a very independent man and well known and respected in his home town of [place].   As far as functional and nutritional status. Daughter states family has noticed a decline in his health over the past several years since his whipple procedure in December 2017. However, he has declined even more over the past 2-3 months. Daughters noticed he had become more weaker and some forgetfulness around November 2018. Prior to that he was in a SNF for rehab post whipple from Jan-July 2018 and then returned home. He moved to [place] with his brother in Jan.  2019 for extra support. Since March patient has become even more weaker. He was able to ambulate independently without assistive devices, however family noticed he lost interest in socializing or leaving the home. He would often stay in the house or sit on the porch at times. He fell about a month ago on the porch and neighbors found him. His brother states about a week prior to admission he woke up every morning confused with intermittent confusion throughout the day. Family states he began requiring some assistance with meals although appetite somewhat was not has hefty as before.    We discussed his current illness and what it means in the larger context of his on-going  co-morbidities.  Natural disease trajectory and expectations at EOL were discussed. Daughter verbalized understanding and is tearful stating she is concerned about his current health and if this is his "new norm". Since admission he is more confused, agitated, and incontinent as well. She knows that if he does not recover he would not be able to be cared for in the home.   I attempted to elicit values and goals of care important to the patient.    The difference between aggressive medical intervention and comfort care was considered in light of the patient's goals of care. At this time daughter would like to continue to treat the treatable, with hopes that he will regain some mental capacity and strength. She is hopeful for the best but also is realistic that he may not show signs of improvement.   Advanced directives, concepts specific to code status, artifical feeding and hydration, and rehospitalization were considered and discussed. Colletta [place], stated they have had several conversations (her father and her) regarding code status etc. He has told her he did not want CPR, life-support or other life sustaining measures such as PEG tube or dialysis, however, she states she is prepared to support his wishes but would like to first discuss more with her sister. She is aware if an emergency situation occurred prior to discussing with her sister and making a confirmed decision staff would proceed aggressively despite her father making them aware this was something he would not want. Daughter states she would contact myself or nurse once decision was made with hopes nothing will happen in the waiting moments.   Hospice and Palliative Care services outpatient were explained. Again daughter is hopefull for some signs of recovery. She would like outpatient palliative services once he is discharged, whether that be to home or a facility.   Questions and concerns were addressed.  Hard Choices booklet left for review.  The family was encouraged to call with questions or concerns.  PMT will continue to support holistically.  Primary Decision Maker: HCPOA/daughters: Jory Sims and Jannifer Hick     SUMMARY OF RECOMMENDATIONS    FULL CODE at family's request. Daughter is going to have a discussion with sister as patient expressed his wishes in the past not to be resuscitated or placed on life support. Colletta [place], is prepared to shift to DNR/DNI however, she wants to make sure sister is aware and comfortable and not have any hard feelings, despite knowing father's wishes. She plans to contact me with their confirmed decision.   Continue to treat the treatable with watchful waiting. Family hopeful he will show some signs of mental and physical recovery.   Case Management consult for outpatient palliative services at discharge at daughter's request.   Palliative Medicine team will continue to support patient, family, and medical team during  hospitalization.    Code Status/Advance Care Planning:  Full code-pending family discussion  Palliative Prophylaxis:   Aspiration, Bowel Regimen, Delirium Protocol, Frequent Pain Assessment, Oral Care and Turn Reposition  Additional Recommendations (Limitations, Scope, Preferences):  Full Scope Treatment-continue to treat the treatable   Psycho-social/Spiritual:   Desire for further Chaplaincy support:NO   Prognosis:   Unable to determine-guarded to poor in the setting of hypertension, altered mental status, decreased po intake, deconditioning, acute CVA, chronic pancreatitis.   Discharge Planning: To Be Determined with outpatient palliative services.      Primary Diagnoses: Present on Admission: . Acute on chronic pancreatitis ([place])   I have reviewed the medical record, interviewed the patient and family, and examined the patient. The following aspects are pertinent.  Past Medical History:  Diagnosis Date  . Pancreatitis   . Patient denies  medical problems    Social History   Socioeconomic History  . Marital status: Single    Spouse name: Not on file  . Number of children: Not on file  . Years of education: Not on file  . Highest education level: Not on file  Occupational History  . Not on file  Social Needs  . Financial resource strain: Not on file  . Food insecurity:    Worry: Not on file    Inability: Not on file  . Transportation needs:    Medical: Not on file    Non-medical: Not on file  Tobacco Use  . Smoking status: Current Every Day Smoker    Packs/day: 0.50    Years: 50.00    Pack years: 25.00    Types: Cigarettes  Substance and Sexual Activity  . Alcohol use: Not Currently  . Drug use: Not Currently  . Sexual activity: Not on file  Lifestyle  . Physical activity:    Days per week: Not on file    Minutes per session: Not on file  . Stress: Not on file  Relationships  . Social connections:    Talks on phone: Not on file    Gets together: Not on file    Attends religious service: Not on file    Active member of club or organization: Not on file    Attends meetings of clubs or organizations: Not on file    Relationship status: Not on file  Other Topics Concern  . Not on file  Social History Narrative  . Not on file   Family History  Problem Relation Age of Onset  . Brain cancer Mother   . Heart disease Brother    Scheduled Meds: . aspirin  300 mg Rectal Daily  . carvedilol  3.125 mg Oral BID WC  . Chlorhexidine Gluconate Cloth  6 each Topical Q0600  . ipratropium-albuterol  3 mL Nebulization Q4H  . methylPREDNISolone (SOLU-MEDROL) injection  60 mg Intravenous Q24H  . mupirocin ointment  1 application Nasal BID  . pantoprazole (PROTONIX) IV  40 mg Intravenous Daily  . sodium chloride flush  10-40 mL Intracatheter Q12H  . sodium chloride flush  10-40 mL Intracatheter Q12H  . sodium chloride flush  3 mL Intravenous Q12H   Continuous Infusions: . sodium chloride    . dexmedetomidine  (PRECEDEX) IV infusion 0.2 mcg/kg/hr (03/24/18 1000)  . dextrose 5 % and 0.45% NaCl 75 mL/hr at 03/23/18 1800   PRN Meds:.sodium chloride, acetaminophen **OR** acetaminophen, albuterol, haloperidol lactate, hydrALAZINE, iohexol, iopamidol, labetalol, LORazepam, ondansetron **OR** ondansetron (ZOFRAN) IV, sodium chloride flush, sodium chloride flush, sodium chloride flush Medications  Prior to Admission:  Prior to Admission medications   Medication Sig Start Date End Date Taking? Authorizing Provider  carvedilol (COREG) 3.125 MG tablet Take 3.125 mg by mouth 2 (two) times daily with a meal.   Yes [provider]  lipase/protease/amylase (CREON) 36000 UNITS CPEP capsule Take 72,000 Units by mouth 3 (three) times daily with meals.   Yes [provider]  mirtazapine (REMERON) 15 MG tablet Take 15 mg by mouth at bedtime.   Yes [provider]  rivaroxaban (XARELTO) 20 MG TABS tablet Take 20 mg by mouth daily with supper.   Yes [provider]   Allergies  Allergen Reactions  . Morphine And Related Hives and Itching   Review of Systems  Unable to perform ROS: Mental status change    Physical Exam  Constitutional: Vital signs are normal. He is sleeping. He has a sickly appearance.  Cardiovascular: Normal heart sounds, intact distal pulses and normal pulses.  Pulmonary/Chest: Effort normal. He has decreased breath sounds.  Abdominal: Soft. Bowel sounds are normal.  Musculoskeletal:  Generalized weakness   Neurological: He is disoriented.  Skin: Skin is warm and dry.  Psychiatric: Cognition and memory are impaired. He expresses inappropriate judgment.  Nursing note and vitals reviewed.   Vital Signs: BP 139/80   Pulse 72   Temp 98.7 F (37.1 C) (Axillary)   Resp 18   Ht 5' 5"  (1.651 m)   Wt 67.9 kg (149 lb 11.1 oz)   SpO2 96%   BMI 24.91 kg/m  Pain Scale: CPOT POSS *See Group Information*: S-Acceptable,Sleep, easy to arouse Pain Score:  Asleep   SpO2: SpO2: 96 % O2 Device:SpO2: 96 % O2 Flow Rate: .O2 Flow Rate (L/min): 2 L/min  IO: Intake/output summary:   Intake/Output Summary (Last 24 hours) at 03/24/2018 1326 Last data filed at 03/24/2018 1000 Gross per 24 hour  Intake 2705.59 ml  Output 190 ml  Net 2515.59 ml    LBM: Last BM Date: 03/23/18 Baseline Weight: Weight: 59 kg (130 lb) Most recent weight: Weight: 67.9 kg (149 lb 11.1 oz)     Palliative Assessment/Data: PPS 30%   Time In: 1130 Time Out: 1300 Time Total: 90 min.   Greater than 50%  of this time was spent counseling and coordinating care related to the above assessment and plan.  Signed by: Alda Lea, NP-BC Palliative Medicine Team  Phone: [phone] Fax: [phone] Pager: [phone]   Please contact Palliative Medicine Team phone at [phone] for questions and concerns.  For individual provider: See Shea Evans

## 2018-03-24 NOTE — Progress Notes (Signed)
[facility] - [place] [facility]   PATIENT NAME: Joe [name]    MR#:  [id number]  DATE OF BIRTH:  October 22, 1945  SUBJECTIVE:   -Continues to be confused and agitated this morning... Patient was moved to ICU yesterday. Daughter at bedside. Currently on IV present extra. Has been responding intermittently to the daughter and nurse. Did not open or follow verbal commands when I've been to evaluate.  REVIEW OF SYSTEMS:  Review of Systems  Unable to perform ROS: Mental status change    DRUG ALLERGIES:   Allergies  Allergen Reactions  . 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 Morphine And Related Hives and Itching    VITALS:  Blood pressure (!) 148/89, pulse 79, temperature 98.1 F (36.7 C), temperature source Oral, resp. rate (!) 26, height 5\' 5"  (1.651 m), weight 67.9 kg (149 lb 11.1 oz), SpO2 96 %.  PHYSICAL EXAMINATION:  Physical Exam   GENERAL:  72 y.o.-year-old patient lying in the bed, very restless.  EYES: Pupils equal, round, reactive to light and accommodation. No scleral icterus. Extraocular muscles intact.  HEENT: Head atraumatic, normocephalic. Oropharynx and nasopharynx clear.  NECK:  Supple, no jugular venous distention. No thyroid enlargement, no tenderness.  LUNGS: Normal breath sounds bilaterally, noted to have upper airway wheezing, no rales,rhonchi or crepitation.  Using accessory muscles of respiration.  Decreased bibasilar breath sounds.  Tachypneic. CARDIOVASCULAR: S1, S2 normal. No murmurs, rubs, or gallops.  ABDOMEN: Soft, diffuse tenderness all over, nondistended. Bowel sounds present. No organomegaly or mass.  EXTREMITIES: No pedal edema, cyanosis, or clubbing.  NEUROLOGIC:  Moving around in bed, alert and able to tell his name but confused.  PSYCHIATRIC: The patient is intermittently alert and hallucinating.  SKIN: No obvious rash, lesion, or ulcer.    LABORATORY PANEL:   CBC Recent Labs  Lab 03/24/18 1019  WBC 6.7  HGB 9.8*  HCT 30.1*  PLT 41*    ------------------------------------------------------------------------------------------------------------------  Chemistries  Recent Labs  Lab 03/21/18 0552  03/24/18 0635 03/24/18 1855  NA 142   < > 139  --   K 4.0   < > 3.7  --   CL 116*   < > 115*  --   CO2 16*   < > 19*  --   GLUCOSE 42*   < > 216*  --   BUN 40*   < > 24*  --   CREATININE 1.91*   < > 0.74  --   CALCIUM 8.0*   < > 8.0*  --   MG 2.1  --   --   --   AST 84*  --   --   --   ALT 34  --   --   --   ALKPHOS 63  --   --   --   BILITOT 5.1*  --   --  2.8*   < > = values in this interval not displayed.   ------------------------------------------------------------------------------------------------------------------  Cardiac Enzymes No results for input(s): TROPONINI in the last 168 hours. ------------------------------------------------------------------------------------------------------------------  RADIOLOGY:  Ct Angio Chest Pe W Or Wo Contrast  Result Date: 03/23/2018 CLINICAL DATA:  Acute onset of recurrent pancreatitis. Nausea, vomiting and generalized abdominal pain. Personal history of deep venous thrombosis. Assess for pulmonary embolus. EXAM: CT ANGIOGRAPHY CHEST WITH CONTRAST TECHNIQUE: Multidetector CT imaging of the chest was performed using the standard protocol during bolus administration of intravenous contrast. Multiplanar CT image reconstructions and MIPs were obtained to evaluate the vascular  anatomy. CONTRAST:  75mL ISOVUE-370 IOPAMIDOL (ISOVUE-370) INJECTION 76% COMPARISON:  CT of the chest performed 10/05/2009 FINDINGS: Cardiovascular:  There is no evidence of pulmonary embolus. The heart is normal in size. The thoracic aorta demonstrates scattered calcification. The great vessels are grossly unremarkable in appearance. Mediastinum/Nodes: The mediastinum is grossly unremarkable. No mediastinal lymphadenopathy is seen. No pericardial effusion is identified. The visualized portions of the  thyroid gland are unremarkable. No axillary lymphadenopathy is seen. Lungs/Pleura: Small right and trace left pleural effusions are noted, with associated atelectasis. Partial opacification of the bronchioles to the lower lobes may reflect debris or possibly aspiration. No pneumothorax is seen. No masses are identified. Upper Abdomen: The visualized portions of the liver and spleen are grossly unremarkable. The patient is status post cholecystectomy, with trace air noted at the gallbladder fossa. Two catheters are noted extending between the stomach and gallbladder fossa. Would correlate clinically as to the type of prior surgery, as the pancreas is still seen, with scattered pancreatic calcifications likely reflect sequelae of chronic pancreatitis. Musculoskeletal: No acute osseous abnormalities are identified. The visualized musculature is unremarkable in appearance. Review of the MIP images confirms the above findings. IMPRESSION: 1. No evidence of pulmonary embolus. 2. Small right and trace left pleural effusions, with associated atelectasis. Partial opacification of the bronchioles to the lower lung lobes may reflect debris or possibly aspiration. 3. Two catheters noted extending between the stomach and gallbladder fossa. Would correlate clinically as to the type of prior surgery, as the pancreas is still present. Trace air noted at the gallbladder fossa, of uncertain significance. 4. Scattered pancreatic calcifications likely reflect sequelae of chronic pancreatitis. Aortic Atherosclerosis (ICD10-I70.0). Electronically Signed   By: Roanna RaiderJeffery  Chang M.D.   On: 03/23/2018 02:51   Mr Laqueta JeanBrain W NFWo Contrast  Result Date: 03/23/2018 CLINICAL DATA:  Initial evaluation for acute altered mental status, unexplained EXAM: MRI HEAD WITHOUT AND WITH CONTRAST TECHNIQUE: Multiplanar, multiecho pulse sequences of the brain and surrounding structures were obtained without and with intravenous contrast. CONTRAST:  13mL  MULTIHANCE GADOBENATE DIMEGLUMINE 529 MG/ML IV SOLN COMPARISON:  Prior CT from 03/24/2018. FINDINGS: Brain: Study moderately degraded by motion artifact. Diffuse prominence of the CSF containing spaces compatible with generalized cerebral atrophy. Confluent T2/FLAIR hyperintensity present within the periventricular and deep white matter both cerebral hemispheres most consistent with chronic small vessel ischemic change, advanced in nature. Scatter remote small volume right MCA territory cortical infarcts noted involving the right frontal and parietal lobes. Remote right thalamic lacunar infarct noted. 7 mm acute ischemic infarct involving the left caudate, best appreciated on coronal DWI sequence (series 101, image 28). No associated hemorrhage or mass effect. No other evidence for acute or subacute ischemia. Gray-white matter differentiation chronic intracranial hemorrhage. No mass lesion, midline shift or mass effect. No hydrocephalus. No extra-axial fluid collection. No abnormal enhancement. Major dural sinuses grossly patent. Pituitary gland suprasellar region grossly normal. Maintained. No evidence for acute or Vascular: Major intracranial vascular flow voids grossly maintained at the skull base. Skull and upper cervical spine: Craniocervical junction not well evaluated on this motion degraded exam. Bone marrow signal intensity grossly normal. No scalp soft tissue abnormality. Sinuses/Orbits: Globes and orbital soft tissues grossly unremarkable. Paranasal sinuses largely clear. No appreciable mastoid effusion. Other: None. IMPRESSION: 1. 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 2. 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 Electronically Signed   By: Janell QuietBenjamin  McClintock M.D.  On: 03/23/2018 14:48   Dg Chest Port 1 View  Result Date: 03/23/2018 CLINICAL DATA: Increasing shortness of breath.  EXAM: PORTABLE CHEST 1 VIEW COMPARISON:  Chest radiograph March 20, 2018 FINDINGS: Cardiomediastinal silhouette is normal. Calcified aortic arch. No pleural effusions or focal consolidations. Trachea projects midline and there is no pneumothorax. Soft tissue planes and included osseous structures are non-suspicious. Left PICC distal tip projects in mid superior vena cava. Pigtailed catheters in upper abdomen. IMPRESSION: No acute cardiopulmonary process. Left PICC distal tip projecting mid superior vena cava. No pneumothorax. Electronically Signed   By: Awilda Metro M.D.   On: 03/23/2018 19:07    EKG:  No orders found for this or any previous visit.  ASSESSMENT AND PLAN:   72 year old male with past medical history significant for chronic pancreatitis status post abdominal surgery for the same, history of alcohol abuse presents to hospital secondary to nausea vomiting and abdominal pain  1.  Acute respiratory distress-with tachypnea and hypoxia -Respiratory rate in the 30s and sats 100% on 2 L now -CT chest with no acute findings, bibasilar atelectasis and mild pleural effusion noted. -Continue supplemental oxygen.  ABG with worsening respiratory alkalosis -We will transfer to stepdown unit  2.-Metabolic encephalopathy- initial concern for withdrawal -could be secondary to acute CVA that was noted on MRI of the brain  But per daughters hasn't been drinking alcohol in 2 years -CT of the head without any acute findings -currently on IV precedents drip. -Still not improving.  Ammonia is within normal limits.   -Neurology consult appreciated. -Currently on rectal aspirin.  3.  Hypertension-unable to take oral medications.  Will start hydralazine PRN  4.  Sepsis-likely from UTI.  Currently on Rocephin.  Check lactic acid.  Improving WBC  5.  DVT prophylaxis-history of prior DVT.  - xarelto held due to thrombocytopenia - repeat dopplers with non occlusive clot in right popliteal vein and  also portal vein -Continue teds and SCDs  6.  Thrombocytopenia-known history of liver disease.  Platelets dropped from 120 to 50.  Hold Xarelto. -concern for sepsis   Physical therapy once more alert.  Palliative care consult for goals of care-- appreciated. Patient currently is full code. Will transition to a DNR DNI been the daughters make the decision   All the records are reviewed and case discussed with Care Management/Social Workerr. Management plans discussed with the patient, family and they are in agreement.  CODE STATUS: Full Code  TOTAL  TIME SPENT IN TAKING CARE OF THIS PATIENT: 30 minutes.   POSSIBLE D/C IN few DAYS, DEPENDING ON CLINICAL CONDITION.   Enedina Finner M.D on 03/24/2018 at 7:56 PM  Between 7am to 6pm - Pager - [phone] After 6pm go to [email or web address] - password [email or web address]  [facility]  Office  [phone]  CC: Primary care physician; Patient, [name] Per

## 2018-03-24 NOTE — Progress Notes (Signed)
Patient visited by family, seen by Neurologist, still not alert enough to try swallowing and thus NPO- MD aware. Patient is opening eyes to voice and making more clearer statements. Carotid Doppler to be performed tomorrow morning- MD aware. Patient turned and repositioned , foley care performed, total UOP . Vital signs stable.

## 2018-03-24 NOTE — Progress Notes (Signed)
ANTICOAGULATION CONSULT NOTE - Initial Consult  Pharmacy Consult for argatroban Indication: DVT  Allergies  Allergen Reactions  . 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 Morphine And Related Hives and Itching    Patient Measurements: Height: 5\' 5"  (165.1 cm) Weight: 149 lb 11.1 oz (67.9 kg) IBW/kg (Calculated) : 61.5  Vital Signs: Temp: 98.3 F (36.8 C) (06/06 1945) Temp Source: Oral (06/06 1945) BP: 152/87 (06/06 2100) Pulse Rate: 79 (06/06 2100)  Labs: Recent Labs    03/22/18 1010 03/23/18 0850 03/24/18 0635 03/24/18 1019 03/24/18 1855  HGB 8.9* 10.4* 9.7* 9.8*  --   HCT 27.5* 33.1* 29.3* 30.1*  --   PLT 56* 61* 45* 41*  --   APTT  --   --   --   --  51*  LABPROT  --   --   --   --  20.1*  INR  --   --   --   --  1.73  CREATININE 1.10 0.81 0.74  --   --   CKTOTAL 144  --   --   --   --     Estimated Creatinine Clearance: 72.6 mL/min (by C-G formula based on SCr of 0.74 mg/dL).   Medical History: Past Medical History:  Diagnosis Date  . Pancreatitis   . Patient denies medical problems     Medications:  Scheduled:  . aspirin  300 mg Rectal Daily  . carvedilol  3.125 mg Oral BID WC  . Chlorhexidine Gluconate Cloth  6 each Topical Q0600  . ipratropium-albuterol  3 mL Nebulization Q4H  . methylPREDNISolone (SOLU-MEDROL) injection  60 mg Intravenous Q24H  . mupirocin ointment  1 application Nasal BID  . pantoprazole (PROTONIX) IV  40 mg Intravenous Daily  . sodium chloride flush  10-40 mL Intracatheter Q12H  . sodium chloride flush  10-40 mL Intracatheter Q12H  . 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 A follow-up aPTT is ordered for 3 hours after  initiation of drip and will be checked every 3 hours until there are 2 consecutive levels within target range, then daily thereafter. When labs are available to evaluate the Child-Pugh score we will confirm that it is low, as expected   6/6:   Initial labs indicate Child - Pugh Score of at least 8 .  Will decrease argatroban drip rate to 0.5 mcg/kg/min.    Kailea Dannemiller D, PharmD 03/24/2018,10:28 PM

## 2018-03-24 NOTE — Progress Notes (Signed)
Follow up - Critical Care Medicine Note  Patient Details:    Joe [name] is an 72 y.o. male. with a past medical history of chronic pancreatitis, status post Whipple's procedure,hypertension,deep venous thrombosis, presently Xarelto, presented with nausea, vomiting and abdominal pain.along with confusion and encephalopathy. CT scan of the abdomen was performed which revealed postsurgical Whipple changes.he was noted to be hypoglycemic, non-anion gap metabolic acidosis, leukocytosis at 25.9. MRI of the brain revealed acute left basal ganglia non-hemorrhagic stroke along with old right MCA and follow-up, distribution lesions.  He is being moved to the intensive care unit/stepdown unit for respiratory distress.   Lines, Airways, Drains: PICC Double Lumen 03/23/18 PICC Left Brachial 42 cm 0 cm (Active)  Indication for Insertion or Continuance of Line Poor Vasculature-patient has had multiple peripheral attempts or PIVs lasting less than 24 hours 03/24/2018  1:25 AM  Exposed Catheter (cm) 0 cm 03/23/2018  4:41 PM  Site Assessment Clean;Dry;Intact 03/24/2018  1:25 AM  Lumen #1 Status Infusing 03/24/2018  1:25 AM  Lumen #2 Status Infusing 03/24/2018  1:25 AM  Dressing Type Transparent 03/24/2018  1:25 AM  Dressing Status Clean;Dry;Intact;Antimicrobial disc in place 03/24/2018  1:25 AM  Dressing Intervention New dressing 03/23/2018  4:41 PM  Dressing Change Due 03/30/18 03/24/2018  1:25 AM     Urethral Catheter Charna Elizabeth, RN Latex 16 Fr. (Active)  Indication for Insertion or Continuance of Catheter Unstable critical patients (first 24-48 hours) 03/24/2018  3:50 AM  Site Assessment Clean;Intact 03/24/2018  3:50 AM  Catheter Maintenance Bag below level of bladder;Catheter secured;Drainage bag/tubing not touching floor;Insertion date on drainage bag;No dependent loops 03/24/2018  3:50 AM  Collection Container Standard drainage bag 03/24/2018  3:50 AM  Securement Method Securing device (Describe) 03/24/2018  3:50 AM     Anti-infectives:  Anti-infectives (From admission, onward)   Start     Dose/Rate Route Frequency Ordered Stop   03/21/18 1500  cefTRIAXone (ROCEPHIN) 1 g in sodium chloride 0.9 % 100 mL IVPB     1 g 200 mL/hr over 30 Minutes Intravenous Daily-1800 03/21/18 1418        Microbiology: Results for orders placed or performed during the hospital encounter of 03/19/18  CULTURE, BLOOD (ROUTINE X 2) w Reflex to ID Panel     Status: None (Preliminary result)   Collection Time: 03/21/18 12:26 PM  Result Value Ref Range Status   Specimen Description BLOOD RIGHT MID LINE  Final   Special Requests   Final    BOTTLES DRAWN AEROBIC AND ANAEROBIC Blood Culture adequate volume   Culture   Final    NO GROWTH 3 DAYS Performed at [facility], [address] [place], [place] [address]    Report Status PENDING  Incomplete  CULTURE, BLOOD (ROUTINE X 2) w Reflex to ID Panel     Status: None (Preliminary result)   Collection Time: 03/21/18 12:29 PM  Result Value Ref Range Status   Specimen Description BLOOD LEFT ANTECUBITAL  Final   Special Requests   Final    BOTTLES DRAWN AEROBIC AND ANAEROBIC Blood Culture adequate volume   Culture   Final    NO GROWTH 3 DAYS Performed at [facility], [address], [place], [place] [address]    Report Status PENDING  Incomplete  Urine Culture     Status: None   Collection Time: 03/21/18 12:46 PM  Result Value Ref Range Status   Specimen Description   Final    URINE, RANDOM Performed at  [facility], [address] [place], [place] [address]    Special Requests   Final    NONE Performed at [facility], [address], [place], [place] [address]    Culture   Final    NO GROWTH Performed at [facility], [address] [place], [place] [address]    Report Status 03/22/2018 FINAL  Final  C difficile quick scan w PCR reflex     Status: None   Collection Time: 03/23/18  7:36 AM  Result Value Ref Range  Status   C Diff antigen NEGATIVE NEGATIVE Final   C Diff toxin NEGATIVE NEGATIVE Final   C Diff interpretation No C. difficile detected.  Final    Comment: Performed at [facility], [address] [place], [place] [address]  MRSA PCR Screening     Status: Abnormal   Collection Time: 03/23/18  5:46 PM  Result Value Ref Range Status   MRSA by PCR POSITIVE (A) NEGATIVE Final    Comment:        The GeneXpert MRSA Assay (FDA approved for NASAL specimens only), is one component of a comprehensive MRSA colonization surveillance program. It is not intended to diagnose MRSA infection nor to guide or monitor treatment for MRSA infections. RESULT CALLED TO, READ BACK BY AND VERIFIED WITH: BARBARA THAO ON 03/23/18 AT 1942 JAG Performed at [facility], [address] [place], [place] [address]     Ct Abdomen Pelvis Wo Contrast  Result Date: 03/19/2018 CLINICAL DATA:  72 year old male with nausea, vomiting, diarrhea, abdominal and back pain. History of chronic pancreatitis. EXAM: CT ABDOMEN AND PELVIS WITHOUT CONTRAST TECHNIQUE: Multidetector CT imaging of the abdomen and pelvis was performed following the standard protocol without IV contrast. COMPARISON:  Prior CT scan of the chest 10/05/2009 FINDINGS: Lower chest: The visualized lower chest is clear. The intracardiac blood pool is hypodense relative to the adjacent myocardium consistent with anemia. No pericardial effusion. Unremarkable distal thoracic esophagus. Hepatobiliary: There are 2 gastro hepatic biliary drainage catheters. One extends from the common bile duct into the gastric lumen, and the second extends from the proximal small bowel into the stomach. Scattered pneumobilia is present and not unexpected. There is persistent dilatation of the right posterior bile ducts. No definite discrete hepatic lesion. Pancreas: Sequelae of chronic pancreatitis with multiple small pancreatic calcifications. Dilatation of the main  pancreatic duct is present. Spleen: Within normal limits Adrenals/Urinary Tract: Unremarkable adrenal glands. Nonspecific perinephric stranding bilaterally worse on the left than the right. No hydronephrosis or nephrolithiasis. Unremarkable ureters and bladder. Stomach/Bowel: Colonic diverticular disease without CT evidence of active inflammation. Surgical changes of prior right hemicolectomy. Surgical changes of prior partial gastrectomy. No evidence of a bowel obstruction or focal bowel wall thickening. Vascular/Lymphatic: Limited evaluation in the absence of intravenous contrast. Extensive atherosclerotic vascular calcifications. No aneurysm. Reproductive: Prostate is unremarkable. Other: No abdominal wall hernia or abnormality. No abdominopelvic ascites. Musculoskeletal: Chronic bilateral L5 pars defects. Mild grade 1 anterolisthesis of L5 on S1. Multilevel degenerative disc disease. No acute fracture or malalignment. IMPRESSION: 1. Two gastro hepatic biliary drainage catheters are present, 1 extending from the stomach into the common bile duct, and the second from the stomach into the proximal small bowel. There is expected pneumobilia, however the right posterior bile ducts are slightly dilated. It is unclear if this is chronic, or an interval change without prior imaging for comparison. Recommend clinical correlation for signs and symptoms of cholangitis. 2. Extensive  surgical changes including prior partial gastric resection with gastroenteric anastomosis and prior right hemicolectomy. No evidence of bowel obstruction. 3. Colonic diverticular disease without CT evidence of active inflammation. 4. The intracardiac blood pool is hypodense relative to the adjacent myocardium consistent with anemia. 5. Sequelae of chronic pancreatitis with likely chronic dilatation of the main pancreatic duct. 6. Chronic bilateral L5 pars defects with mild grade 1 anterolisthesis of L5 on S1. 7.  Aortic Atherosclerosis  (ICD10-170.0) Electronically Signed   By: Malachy MoanHeath  McCullough M.D.   On: 03/19/2018 13:55   Dg Chest 1 View  Result Date: 03/20/2018 CLINICAL DATA:  Wheezing.  Slightly labored breathing. EXAM: CHEST  1 VIEW COMPARISON:  October 01, 2009 FINDINGS: Stents in the upper abdomen of uncertain etiology. A density over the lateral right lung was noted to represent a sclerotic bone lesion in 2010. No other nodules or masses. The heart size is borderline to mildly enlarged. There is a torturous thoracic aorta again identified. The mediastinum is unchanged given portable technique. No pneumothorax. No focal infiltrate. IMPRESSION: No acute abnormality. Electronically Signed   By: Gerome Samavid  Williams III M.D   On: 03/20/2018 14:43   Ct Head Wo Contrast  Result Date: 03/21/2018 CLINICAL DATA:  [id number] year old male with history of chronic pancreatitis and alcohol abuse presenting with nausea, vomiting and abdominal pain. Altered level of consciousness. Initial encounter. EXAM: CT HEAD WITHOUT CONTRAST TECHNIQUE: Contiguous axial images were obtained from the base of the skull through the vertex without intravenous contrast. COMPARISON:  No comparison head CT. FINDINGS: Brain: No intracranial hemorrhage or CT evidence of large acute infarct. Moderate chronic microvascular changes. Global atrophy. No intracranial mass lesion noted on this unenhanced exam. Vascular: Vascular calcifications Skull: Negative Sinuses/Orbits: No acute orbital abnormality. Minimal exophthalmos. Visualized paranasal sinuses are clear. Other: Mastoid air cells and middle ear cavities are clear. IMPRESSION: No acute intracranial abnormality. Moderate chronic microvascular changes. Global atrophy. Electronically Signed   By: Lacy DuverneySteven  Olson M.D.   On: 03/21/2018 11:45   Ct Angio Chest Pe W Or Wo Contrast  Result Date: 03/23/2018 CLINICAL DATA:  Acute onset of recurrent pancreatitis. Nausea, vomiting and generalized abdominal pain. Personal history of deep  venous thrombosis. Assess for pulmonary embolus. EXAM: CT ANGIOGRAPHY CHEST WITH CONTRAST TECHNIQUE: Multidetector CT imaging of the chest was performed using the standard protocol during bolus administration of intravenous contrast. Multiplanar CT image reconstructions and MIPs were obtained to evaluate the vascular anatomy. CONTRAST:  75mL ISOVUE-370 IOPAMIDOL (ISOVUE-370) INJECTION 76% COMPARISON:  CT of the chest performed 10/05/2009 FINDINGS: Cardiovascular:  There is no evidence of pulmonary embolus. The heart is normal in size. The thoracic aorta demonstrates scattered calcification. The great vessels are grossly unremarkable in appearance. Mediastinum/Nodes: The mediastinum is grossly unremarkable. No mediastinal lymphadenopathy is seen. No pericardial effusion is identified. The visualized portions of the thyroid gland are unremarkable. No axillary lymphadenopathy is seen. Lungs/Pleura: Small right and trace left pleural effusions are noted, with associated atelectasis. Partial opacification of the bronchioles to the lower lobes may reflect debris or possibly aspiration. No pneumothorax is seen. No masses are identified. Upper Abdomen: The visualized portions of the liver and spleen are grossly unremarkable. The patient is status post cholecystectomy, with trace air noted at the gallbladder fossa. Two catheters are noted extending between the stomach and gallbladder fossa. Would correlate clinically as to the type of prior surgery, as the pancreas is still seen, with scattered pancreatic calcifications likely reflect sequelae of chronic pancreatitis. Musculoskeletal: No acute osseous  abnormalities are identified. The visualized musculature is unremarkable in appearance. Review of the MIP images confirms the above findings. IMPRESSION: 1. No evidence of pulmonary embolus. 2. Small right and trace left pleural effusions, with associated atelectasis. Partial opacification of the bronchioles to the lower lung  lobes may reflect debris or possibly aspiration. 3. Two catheters noted extending between the stomach and gallbladder fossa. Would correlate clinically as to the type of prior surgery, as the pancreas is still present. Trace air noted at the gallbladder fossa, of uncertain significance. 4. Scattered pancreatic calcifications likely reflect sequelae of chronic pancreatitis. Aortic Atherosclerosis (ICD10-I70.0). Electronically Signed   By: Roanna Raider M.D.   On: 03/23/2018 02:51   Mr Laqueta Jean ZO Contrast  Result Date: 03/23/2018 CLINICAL DATA:  Initial evaluation for acute altered mental status, unexplained EXAM: MRI HEAD WITHOUT AND WITH CONTRAST TECHNIQUE: Multiplanar, multiecho pulse sequences of the brain and surrounding structures were obtained without and with intravenous contrast. CONTRAST:  13mL MULTIHANCE GADOBENATE DIMEGLUMINE 529 MG/ML IV SOLN COMPARISON:  Prior CT from 03/24/2018. FINDINGS: Brain: Study moderately degraded by motion artifact. Diffuse prominence of the CSF containing spaces compatible with generalized cerebral atrophy. Confluent T2/FLAIR hyperintensity present within the periventricular and deep white matter both cerebral hemispheres most consistent with chronic small vessel ischemic change, advanced in nature. Scatter remote small volume right MCA territory cortical infarcts noted involving the right frontal and parietal lobes. Remote right thalamic lacunar infarct noted. 7 mm acute ischemic infarct involving the left caudate, best appreciated on coronal DWI sequence (series 101, image 28). No associated hemorrhage or mass effect. No other evidence for acute or subacute ischemia. Gray-white matter differentiation chronic intracranial hemorrhage. No mass lesion, midline shift or mass effect. No hydrocephalus. No extra-axial fluid collection. No abnormal enhancement. Major dural sinuses grossly patent. Pituitary gland suprasellar region grossly normal. Maintained. No evidence for acute  or Vascular: Major intracranial vascular flow voids grossly maintained at the skull base. Skull and upper cervical spine: Craniocervical junction not well evaluated on this motion degraded exam. Bone marrow signal intensity grossly normal. No scalp soft tissue abnormality. Sinuses/Orbits: Globes and orbital soft tissues grossly unremarkable. Paranasal sinuses largely clear. No appreciable mastoid effusion. Other: None. IMPRESSION: 1. 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 2. 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 Electronically Signed   By: Rise Mu M.D.   On: 03/23/2018 14:48   US Venous Img Lower Bilateral  Result Date: 03/21/2018 CLINICAL DATA:  Bilateral lower extremity pain and swelling EXAM: BILATERAL LOWER EXTREMITY VENOUS DOPPLER ULTRASOUND TECHNIQUE: Gray-scale sonography with graded compression, as well as color Doppler and duplex ultrasound were performed to evaluate the lower extremity deep venous systems from the level of the common femoral vein and including the common femoral, femoral, profunda femoral, popliteal and calf veins including the posterior tibial, peroneal and gastrocnemius veins when visible. The superficial great saphenous vein was also interrogated. Spectral Doppler was utilized to evaluate flow at rest and with distal augmentation maneuvers in the common femoral, femoral and popliteal veins. COMPARISON:  None. FINDINGS: RIGHT LOWER EXTREMITY Common Femoral Vein: No evidence of thrombus. Normal compressibility, respiratory phasicity and response to augmentation. Saphenofemoral Junction: No evidence of thrombus. Normal compressibility and flow on color Doppler imaging. Profunda Femoral Vein: No evidence of thrombus. Normal compressibility and flow on color Doppler imaging. Femoral Vein: No evidence of thrombus. Normal compressibility, respiratory  phasicity and response to  augmentation. Popliteal Vein: Thrombus is noted within which is nonocclusive in nature. Calf Veins: No evidence of thrombus. Normal compressibility and flow on color Doppler imaging. Superficial Great Saphenous Vein: No evidence of thrombus. Normal compressibility. Venous Reflux:  None. Other Findings:  None. LEFT LOWER EXTREMITY Common Femoral Vein: No evidence of thrombus. Normal compressibility, respiratory phasicity and response to augmentation. Saphenofemoral Junction: No evidence of thrombus. Normal compressibility and flow on color Doppler imaging. Profunda Femoral Vein: No evidence of thrombus. Normal compressibility and flow on color Doppler imaging. Femoral Vein: No evidence of thrombus. Normal compressibility, respiratory phasicity and response to augmentation. Popliteal Vein: No evidence of thrombus. Normal compressibility, respiratory phasicity and response to augmentation. Calf Veins: No evidence of thrombus. Normal compressibility and flow on color Doppler imaging. Superficial Great Saphenous Vein: No evidence of thrombus. Normal compressibility. Venous Reflux:  None. Other Findings:  None. IMPRESSION: Deep venous thrombosis within the right popliteal vein which is nonocclusive in nature. Incidental note is made of arterial calcifications bilaterally. Electronically Signed   By: Alcide Clever M.D.   On: 03/21/2018 14:01   Dg Chest Port 1 View  Result Date: 03/23/2018 CLINICAL DATA: Increasing shortness of breath. EXAM: PORTABLE CHEST 1 VIEW COMPARISON:  Chest radiograph March 20, 2018 FINDINGS: Cardiomediastinal silhouette is normal. Calcified aortic arch. No pleural effusions or focal consolidations. Trachea projects midline and there is no pneumothorax. Soft tissue planes and included osseous structures are non-suspicious. Left PICC distal tip projects in mid superior vena cava. Pigtailed catheters in upper abdomen. IMPRESSION: No acute cardiopulmonary process. Left PICC  distal tip projecting mid superior vena cava. No pneumothorax. Electronically Signed   By: Awilda Metro M.D.   On: 03/23/2018 19:07   US Abdominal Pelvic Art/vent Flow Doppler Limited  Result Date: 03/21/2018 CLINICAL DATA:  Right upper quadrant abdominal pain. EXAM: ULTRASOUND ABDOMEN LIMITED RIGHT UPPER QUADRANT COMPARISON:  CT scan of March 19, 2018. FINDINGS: Gallbladder: Status post cholecystectomy. Common bile duct: Diameter: 3.4 mm which is within normal limits. Catheter is noted within common bile duct consistent with gastrohepatic biliary drainage catheter as described on prior CT scan. Liver: No focal lesion identified. Within normal limits in parenchymal echogenicity. 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 IMPRESSION: Status post cholecystectomy. Gastrohepatic biliary drainage catheter is noted within common bile duct. Thrombosis of main portal vein is noted. Possible varices are noted in porta hepatis region suggesting collaterals. Electronically Signed   By: Lupita Raider, M.D.   On: 03/21/2018 14:07   [place] Ekg Site Rite  Result Date: 03/22/2018 If Site Rite image not attached, placement could not be confirmed due to current cardiac rhythm.  US Abdomen Limited Ruq  Result Date: 03/21/2018 CLINICAL DATA:  Right upper quadrant abdominal pain. EXAM: ULTRASOUND ABDOMEN LIMITED RIGHT UPPER QUADRANT COMPARISON:  CT scan of March 19, 2018. FINDINGS: Gallbladder: Status post cholecystectomy. Common bile duct: Diameter: 3.4 mm which is within normal limits. Catheter is noted within common bile duct consistent with gastrohepatic biliary drainage catheter as described on prior CT scan. Liver: No focal lesion identified. Within normal limits in parenchymal echogenicity. 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 IMPRESSION: Status post cholecystectomy. Gastrohepatic biliary drainage catheter is noted within  common bile duct. Thrombosis of main portal vein is noted. Possible varices are noted in porta hepatis region suggesting collaterals. Electronically Signed   By: Lupita Raider, M.D.   On:  03/21/2018 14:07    Consults: Treatment Team:  Thana Farr, MD   Subjective:    Overnight Issues: PATIENT WAS STARTED ON PRECEDEX INFUSION FOR AGITATION. PRESENTLY RESTING COMFORTABLY  Objective:  Vital signs for last 24 hours: Temp:  [94.7 F (34.8 C)-98.7 F (37.1 C)] 98.7 F (37.1 C) (06/06 0400) Pulse Rate:  [55-127] 70 (06/06 0600) Resp:  [15-21] 19 (06/06 0600) BP: (81-234)/(50-201) 131/75 (06/06 0600) SpO2:  [91 %-99 %] 97 % (06/06 0600) Weight:  [149 lb 11.1 oz (67.9 kg)] 149 lb 11.1 oz (67.9 kg) (06/05 1740)  Hemodynamic parameters for last 24 hours:    Intake/Output from previous day: 06/05 0701 - 06/06 0700 In: 2002.7 [I.V.:1902.7; IV Piggyback:100] Out: 190 [Urine:190]  Intake/Output this shift: No intake/output data recorded.  Vent settings for last 24 hours:    Physical Exam:  Elderly appearing African-American male, on precedex Vital signs:       Please see the above listed vital signs HEENT:           Trachea midline, no thyromegaly noted, increased respiratory rate with accessory muscle utilization Cardiovascular:           Tachycardia appreciated Pulmonary:      Coarse rhonchi noted Abdominal:      Tender to palpation, prior surgical changes noted Extremities:     No clubbing cyanosis or edema noted Neurologic:      Patient moves all extremities    Assessment/Plan:  History of chronic pancreatitis, status post Whipple's procedure,admitted for nausea vomiting abdominal pain. Has had supportive care, rehydration, pain and nausea control, treated empirically with Rocephin. Respiratory status has worsened with increasing tachypnea, respiratory alkalosis. CT scan of the chest performed did not reveal any acute pulmonary emboli and only showed some mild bibasilar  atelectasis with effusions. Patient will be moved to stepdown unit for closer monitoring  Encephalopathy. MRI did reveal acute left basal ganglia stroke nonhemorrhagic. [facility] consult neurology for further input and guidance on therapeutics  Prior history of DVT, patient has been on Xarelto. Being held for thrombocytopenia. If unable to resume will need to consider filter placement, ultrasound did reveal nonocclusive right popliteal deep venous thrombosis  Hypertension. Patient unable to take oral medications being treated with when necessary IV dosing    Critical Care Total Time 35 minutes  Gwendolyne Welford 03/24/2018  *Care during the described time interval was provided by me and/or other providers on the critical care team.  I have reviewed this patient's available data, including medical history, events of note, physical examination and test results as part of my evaluation.

## 2018-03-25 DIAGNOSIS — I639 Cerebral infarction, unspecified: Secondary | ICD-10-CM

## 2018-03-25 DIAGNOSIS — R609 Edema, unspecified: Secondary | ICD-10-CM

## 2018-03-25 DIAGNOSIS — R0602 Shortness of breath: Secondary | ICD-10-CM

## 2018-03-25 LAB — COMPREHENSIVE METABOLIC PANEL
ALBUMIN: 2.2 g/dL — AB (ref 3.5–5.0)
ALK PHOS: 68 U/L (ref 38–126)
ALK PHOS: 63 U/L (ref 38–126)
ALT: 18 U/L (ref 17–63)
ALT: 17 U/L (ref 17–63)
ANION GAP: 6 (ref 5–15)
AST: 19 U/L (ref 15–41)
AST: 18 U/L (ref 15–41)
Albumin: 2.2 g/dL — ABNORMAL LOW (ref 3.5–5.0)
Anion gap: 5 (ref 5–15)
BUN: 22 mg/dL — ABNORMAL HIGH (ref 6–20)
BUN: 22 mg/dL — AB (ref 6–20)
CALCIUM: 8.1 mg/dL — AB (ref 8.9–10.3)
CALCIUM: 8.3 mg/dL — AB (ref 8.9–10.3)
CO2: 20 mmol/L — ABNORMAL LOW (ref 22–32)
CO2: 21 mmol/L — AB (ref 22–32)
CREATININE: 0.83 mg/dL (ref 0.61–1.24)
Chloride: 115 mmol/L — ABNORMAL HIGH (ref 101–111)
Chloride: 116 mmol/L — ABNORMAL HIGH (ref 101–111)
Creatinine, Ser: 0.8 mg/dL (ref 0.61–1.24)
GFR calc Af Amer: >60 mL/min (ref >60)
GFR calc non Af Amer: >60 mL/min (ref >60)
GFR calc non Af Amer: >60 mL/min (ref >60)
Glucose, Bld: 138 mg/dL — ABNORMAL HIGH (ref 65–99)
Glucose, Bld: 146 mg/dL — ABNORMAL HIGH (ref 65–99)
Potassium: 3.6 mmol/L (ref 3.5–5.1)
Potassium: 3.6 mmol/L (ref 3.5–5.1)
Sodium: 141 mmol/L (ref 135–145)
Sodium: 142 mmol/L (ref 135–145)
TOTAL PROTEIN: 5.6 g/dL — AB (ref 6.5–8.1)
Total Bilirubin: 2.7 mg/dL — ABNORMAL HIGH (ref 0.3–1.2)
Total Bilirubin: 2.5 mg/dL — ABNORMAL HIGH (ref 0.3–1.2)
Total Protein: 5.6 g/dL — ABNORMAL LOW (ref 6.5–8.1)

## 2018-03-25 LAB — C DIFFICILE QUICK SCREEN W PCR REFLEX
C DIFFICILE (CDIFF) TOXIN: NEGATIVE
C Diff antigen: NEGATIVE
C Diff interpretation: NOT DETECTED

## 2018-03-25 LAB — CBC
HCT: 28.9 % — ABNORMAL LOW (ref 40.0–52.0)
HEMOGLOBIN: 9.4 g/dL — AB (ref 13.0–18.0)
MCH: 23.7 pg — AB (ref 26.0–34.0)
MCHC: 32.6 g/dL (ref 32.0–36.0)
MCV: 72.6 fL — ABNORMAL LOW (ref 80.0–100.0)
Platelets: 41 10*3/uL — ABNORMAL LOW (ref 150–440)
RBC: 3.98 MIL/uL — AB (ref 4.40–5.90)
RDW: 21.4 % — ABNORMAL HIGH (ref 11.5–14.5)
WBC: 6.1 10*3/uL (ref 3.8–10.6)

## 2018-03-25 LAB — HEPARIN INDUCED PLATELET AB (HIT ANTIBODY): HEPARIN INDUCED PLT AB: 0.228 {OD_unit} (ref 0.000–0.400)

## 2018-03-25 LAB — APTT
aPTT: 88 seconds — ABNORMAL HIGH (ref 24–36)
aPTT: 82 seconds — ABNORMAL HIGH (ref 24–36)

## 2018-03-25 MED ORDER — METHYLPREDNISOLONE SODIUM SUCC 125 MG IJ SOLR
60.0000 mg | Freq: Every day | INTRAMUSCULAR | Status: DC
  Administered 2018-03-26: 60 mg via INTRAVENOUS
  Filled 2018-03-25: qty 2

## 2018-03-25 MED ORDER — PANTOPRAZOLE SODIUM 40 MG PO TBEC
40.0000 mg | DELAYED_RELEASE_TABLET | Freq: Two times a day (BID) | ORAL | Status: DC
  Administered 2018-03-25 – 2018-03-29 (×8): 40 mg via ORAL
  Filled 2018-03-25 (×8): qty 1

## 2018-03-25 MED ORDER — IPRATROPIUM-ALBUTEROL 0.5-2.5 (3) MG/3ML IN SOLN
3.0000 mL | Freq: Four times a day (QID) | RESPIRATORY_TRACT | Status: DC
  Administered 2018-03-25 – 2018-03-26 (×2): 3 mL via RESPIRATORY_TRACT
  Filled 2018-03-25 (×3): qty 3

## 2018-03-25 MED ORDER — MIRTAZAPINE 15 MG PO TABS
15.0000 mg | ORAL_TABLET | Freq: Every day | ORAL | Status: DC
  Administered 2018-03-25 – 2018-03-28 (×4): 15 mg via ORAL
  Filled 2018-03-25 (×4): qty 1

## 2018-03-25 MED ORDER — ASPIRIN 325 MG PO TABS
325.0000 mg | ORAL_TABLET | Freq: Every day | ORAL | Status: DC
  Administered 2018-03-26 – 2018-03-29 (×4): 325 mg via ORAL
  Filled 2018-03-25 (×4): qty 1

## 2018-03-25 NOTE — Progress Notes (Signed)
Follow up - Critical Care Medicine Note  Patient Details:    Joe [name] is an 72 y.o. male. with a past medical history of chronic pancreatitis, status post Whipple's procedure,hypertension,deep venous thrombosis, presently Xarelto, presented with nausea, vomiting and abdominal pain.along with confusion and encephalopathy. CT scan of the abdomen was performed which revealed postsurgical Whipple changes.he was noted to be hypoglycemic, non-anion gap metabolic acidosis, leukocytosis at 25.9. MRI of the brain revealed acute left basal ganglia non-hemorrhagic stroke along with old right MCA and follow-up, distribution lesions.  He is being moved to the intensive care unit/stepdown unit for respiratory distress.   Lines, Airways, Drains: PICC Double Lumen 03/23/18 PICC Left Brachial 42 cm 0 cm (Active)  Indication for Insertion or Continuance of Line Poor Vasculature-patient has had multiple peripheral attempts or PIVs lasting less than 24 hours 03/24/2018  1:25 AM  Exposed Catheter (cm) 0 cm 03/23/2018  4:41 PM  Site Assessment Clean;Dry;Intact 03/24/2018  1:25 AM  Lumen #1 Status Infusing 03/24/2018  1:25 AM  Lumen #2 Status Infusing 03/24/2018  1:25 AM  Dressing Type Transparent 03/24/2018  1:25 AM  Dressing Status Clean;Dry;Intact;Antimicrobial disc in place 03/24/2018  1:25 AM  Dressing Intervention New dressing 03/23/2018  4:41 PM  Dressing Change Due 03/30/18 03/24/2018  1:25 AM     Urethral Catheter Charna Elizabeth, RN Latex 16 Fr. (Active)  Indication for Insertion or Continuance of Catheter Unstable critical patients (first 24-48 hours) 03/24/2018  3:50 AM  Site Assessment Clean;Intact 03/24/2018  3:50 AM  Catheter Maintenance Bag below level of bladder;Catheter secured;Drainage bag/tubing not touching floor;Insertion date on drainage bag;No dependent loops 03/24/2018  3:50 AM  Collection Container Standard drainage bag 03/24/2018  3:50 AM  Securement Method Securing device (Describe) 03/24/2018  3:50 AM     Anti-infectives:  Anti-infectives (From admission, onward)   Start     Dose/Rate Route Frequency Ordered Stop   03/21/18 1500  cefTRIAXone (ROCEPHIN) 1 g in sodium chloride 0.9 % 100 mL IVPB  Status:  Discontinued     1 g 200 mL/hr over 30 Minutes Intravenous Daily-1800 03/21/18 1418 03/24/18 1019      Microbiology: Results for orders placed or performed during the hospital encounter of 03/19/18  CULTURE, BLOOD (ROUTINE X 2) w Reflex to ID Panel     Status: None (Preliminary result)   Collection Time: 03/21/18 12:26 PM  Result Value Ref Range Status   Specimen Description BLOOD RIGHT MID LINE  Final   Special Requests   Final    BOTTLES DRAWN AEROBIC AND ANAEROBIC Blood Culture adequate volume   Culture   Final    NO GROWTH 4 DAYS Performed at [facility], [address] [place], [place] [address]    Report Status PENDING  Incomplete  CULTURE, BLOOD (ROUTINE X 2) w Reflex to ID Panel     Status: None (Preliminary result)   Collection Time: 03/21/18 12:29 PM  Result Value Ref Range Status   Specimen Description BLOOD LEFT ANTECUBITAL  Final   Special Requests   Final    BOTTLES DRAWN AEROBIC AND ANAEROBIC Blood Culture adequate volume   Culture   Final    NO GROWTH 4 DAYS Performed at [facility], [address] [place], [place] [address]    Report Status PENDING  Incomplete  Urine Culture     Status: None   Collection Time: 03/21/18 12:46 PM  Result Value Ref Range Status   Specimen Description   Final  URINE, RANDOM Performed at [facility], [address] [place], [place] [address]    Special Requests   Final    NONE Performed at [facility], [address], [place], [place] [address]    Culture   Final    NO GROWTH Performed at [facility], [address], [place], [place] [address]    Report Status 03/22/2018 FINAL  Final  C difficile quick scan w PCR reflex     Status: None   Collection Time: 03/23/18   7:36 AM  Result Value Ref Range Status   C Diff antigen NEGATIVE NEGATIVE Final   C Diff toxin NEGATIVE NEGATIVE Final   C Diff interpretation No C. difficile detected.  Final    Comment: Performed at [facility], [address] [place], [place] [address]  MRSA PCR Screening     Status: Abnormal   Collection Time: 03/23/18  5:46 PM  Result Value Ref Range Status   MRSA by PCR POSITIVE (A) NEGATIVE Final    Comment:        The GeneXpert MRSA Assay (FDA approved for NASAL specimens only), is one component of a comprehensive MRSA colonization surveillance program. It is not intended to diagnose MRSA infection nor to guide or monitor treatment for MRSA infections. RESULT CALLED TO, READ BACK BY AND VERIFIED WITH: BARBARA THAO ON 03/23/18 AT 1942 JAG Performed at [facility], [address] [place], [place] [address]     Ct Abdomen Pelvis Wo Contrast  Result Date: 03/19/2018 CLINICAL DATA:  72 year old male with nausea, vomiting, diarrhea, abdominal and back pain. History of chronic pancreatitis. EXAM: CT ABDOMEN AND PELVIS WITHOUT CONTRAST TECHNIQUE: Multidetector CT imaging of the abdomen and pelvis was performed following the standard protocol without IV contrast. COMPARISON:  Prior CT scan of the chest 10/05/2009 FINDINGS: Lower chest: The visualized lower chest is clear. The intracardiac blood pool is hypodense relative to the adjacent myocardium consistent with anemia. No pericardial effusion. Unremarkable distal thoracic esophagus. Hepatobiliary: There are 2 gastro hepatic biliary drainage catheters. One extends from the common bile duct into the gastric lumen, and the second extends from the proximal small bowel into the stomach. Scattered pneumobilia is present and not unexpected. There is persistent dilatation of the right posterior bile ducts. No definite discrete hepatic lesion. Pancreas: Sequelae of chronic pancreatitis with multiple small pancreatic  calcifications. Dilatation of the main pancreatic duct is present. Spleen: Within normal limits Adrenals/Urinary Tract: Unremarkable adrenal glands. Nonspecific perinephric stranding bilaterally worse on the left than the right. No hydronephrosis or nephrolithiasis. Unremarkable ureters and bladder. Stomach/Bowel: Colonic diverticular disease without CT evidence of active inflammation. Surgical changes of prior right hemicolectomy. Surgical changes of prior partial gastrectomy. No evidence of a bowel obstruction or focal bowel wall thickening. Vascular/Lymphatic: Limited evaluation in the absence of intravenous contrast. Extensive atherosclerotic vascular calcifications. No aneurysm. Reproductive: Prostate is unremarkable. Other: No abdominal wall hernia or abnormality. No abdominopelvic ascites. Musculoskeletal: Chronic bilateral L5 pars defects. Mild grade 1 anterolisthesis of L5 on S1. Multilevel degenerative disc disease. No acute fracture or malalignment. IMPRESSION: 1. Two gastro hepatic biliary drainage catheters are present, 1 extending from the stomach into the common bile duct, and the second from the stomach into the proximal small bowel. There is expected pneumobilia, however the right posterior bile ducts are slightly dilated. It is unclear if this is chronic, or an interval change without prior imaging for comparison. Recommend clinical correlation for signs and symptoms  of cholangitis. 2. Extensive surgical changes including prior partial gastric resection with gastroenteric anastomosis and prior right hemicolectomy. No evidence of bowel obstruction. 3. Colonic diverticular disease without CT evidence of active inflammation. 4. The intracardiac blood pool is hypodense relative to the adjacent myocardium consistent with anemia. 5. Sequelae of chronic pancreatitis with likely chronic dilatation of the main pancreatic duct. 6. Chronic bilateral L5 pars defects with mild grade 1 anterolisthesis of L5 on  S1. 7.  Aortic Atherosclerosis (ICD10-170.0) Electronically Signed   By: Malachy MoanHeath  McCullough M.D.   On: 03/19/2018 13:55   Dg Chest 1 View  Result Date: 03/20/2018 CLINICAL DATA:  Wheezing.  Slightly labored breathing. EXAM: CHEST  1 VIEW COMPARISON:  October 01, 2009 FINDINGS: Stents in the upper abdomen of uncertain etiology. A density over the lateral right lung was noted to represent a sclerotic bone lesion in 2010. No other nodules or masses. The heart size is borderline to mildly enlarged. There is a torturous thoracic aorta again identified. The mediastinum is unchanged given portable technique. No pneumothorax. No focal infiltrate. IMPRESSION: No acute abnormality. Electronically Signed   By: Gerome Samavid  Williams III M.D   On: 03/20/2018 14:43   Ct Head Wo Contrast  Result Date: 03/21/2018 CLINICAL DATA:  72 year old male with history of chronic pancreatitis and alcohol abuse presenting with nausea, vomiting and abdominal pain. Altered level of consciousness. Initial encounter. EXAM: CT HEAD WITHOUT CONTRAST TECHNIQUE: Contiguous axial images were obtained from the base of the skull through the vertex without intravenous contrast. COMPARISON:  No comparison head CT. FINDINGS: Brain: No intracranial hemorrhage or CT evidence of large acute infarct. Moderate chronic microvascular changes. Global atrophy. No intracranial mass lesion noted on this unenhanced exam. Vascular: Vascular calcifications Skull: Negative Sinuses/Orbits: No acute orbital abnormality. Minimal exophthalmos. Visualized paranasal sinuses are clear. Other: Mastoid air cells and middle ear cavities are clear. IMPRESSION: No acute intracranial abnormality. Moderate chronic microvascular changes. Global atrophy. Electronically Signed   By: Lacy DuverneySteven  Olson M.D.   On: 03/21/2018 11:45   Ct Angio Chest Pe W Or Wo Contrast  Result Date: 03/23/2018 CLINICAL DATA:  Acute onset of recurrent pancreatitis. Nausea, vomiting and generalized abdominal  pain. Personal history of deep venous thrombosis. Assess for pulmonary embolus. EXAM: CT ANGIOGRAPHY CHEST WITH CONTRAST TECHNIQUE: Multidetector CT imaging of the chest was performed using the standard protocol during bolus administration of intravenous contrast. Multiplanar CT image reconstructions and MIPs were obtained to evaluate the vascular anatomy. CONTRAST:  75mL ISOVUE-370 IOPAMIDOL (ISOVUE-370) INJECTION 76% COMPARISON:  CT of the chest performed 10/05/2009 FINDINGS: Cardiovascular:  There is no evidence of pulmonary embolus. The heart is normal in size. The thoracic aorta demonstrates scattered calcification. The great vessels are grossly unremarkable in appearance. Mediastinum/Nodes: The mediastinum is grossly unremarkable. No mediastinal lymphadenopathy is seen. No pericardial effusion is identified. The visualized portions of the thyroid gland are unremarkable. No axillary lymphadenopathy is seen. Lungs/Pleura: Small right and trace left pleural effusions are noted, with associated atelectasis. Partial opacification of the bronchioles to the lower lobes may reflect debris or possibly aspiration. No pneumothorax is seen. No masses are identified. Upper Abdomen: The visualized portions of the liver and spleen are grossly unremarkable. The patient is status post cholecystectomy, with trace air noted at the gallbladder fossa. Two catheters are noted extending between the stomach and gallbladder fossa. Would correlate clinically as to the type of prior surgery, as the pancreas is still seen, with scattered pancreatic calcifications likely reflect sequelae of chronic pancreatitis.  Musculoskeletal: No acute osseous abnormalities are identified. The visualized musculature is unremarkable in appearance. Review of the MIP images confirms the above findings. IMPRESSION: 1. No evidence of pulmonary embolus. 2. Small right and trace left pleural effusions, with associated atelectasis. Partial opacification of the  bronchioles to the lower lung lobes may reflect debris or possibly aspiration. 3. Two catheters noted extending between the stomach and gallbladder fossa. Would correlate clinically as to the type of prior surgery, as the pancreas is still present. Trace air noted at the gallbladder fossa, of uncertain significance. 4. Scattered pancreatic calcifications likely reflect sequelae of chronic pancreatitis. Aortic Atherosclerosis (ICD10-I70.0). Electronically Signed   By: Roanna Raider M.D.   On: 03/23/2018 02:51   Mr Laqueta Jean WU Contrast  Result Date: 03/23/2018 CLINICAL DATA:  Initial evaluation for acute altered mental status, unexplained EXAM: MRI HEAD WITHOUT AND WITH CONTRAST TECHNIQUE: Multiplanar, multiecho pulse sequences of the brain and surrounding structures were obtained without and with intravenous contrast. CONTRAST:  13mL MULTIHANCE GADOBENATE DIMEGLUMINE 529 MG/ML IV SOLN COMPARISON:  Prior CT from 03/24/2018. FINDINGS: Brain: Study moderately degraded by motion artifact. Diffuse prominence of the CSF containing spaces compatible with generalized cerebral atrophy. Confluent T2/FLAIR hyperintensity present within the periventricular and deep white matter both cerebral hemispheres most consistent with chronic small vessel ischemic change, advanced in nature. Scatter remote small volume right MCA territory cortical infarcts noted involving the right frontal and parietal lobes. Remote right thalamic lacunar infarct noted. 7 mm acute ischemic infarct involving the left caudate, best appreciated on coronal DWI sequence (series 101, image 28). No associated hemorrhage or mass effect. No other evidence for acute or subacute ischemia. Gray-white matter differentiation chronic intracranial hemorrhage. No mass lesion, midline shift or mass effect. No hydrocephalus. No extra-axial fluid collection. No abnormal enhancement. Major dural sinuses grossly patent. Pituitary gland suprasellar region grossly normal.  Maintained. No evidence for acute or Vascular: Major intracranial vascular flow voids grossly maintained at the skull base. Skull and upper cervical spine: Craniocervical junction not well evaluated on this motion degraded exam. Bone marrow signal intensity grossly normal. No scalp soft tissue abnormality. Sinuses/Orbits: Globes and orbital soft tissues grossly unremarkable. Paranasal sinuses largely clear. No appreciable mastoid effusion. Other: None. IMPRESSION: 1. 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 2. 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 Electronically Signed   By: Rise Mu M.D.   On: 03/23/2018 14:48   US Venous Img Lower Bilateral  Result Date: 03/21/2018 CLINICAL DATA:  Bilateral lower extremity pain and swelling EXAM: BILATERAL LOWER EXTREMITY VENOUS DOPPLER ULTRASOUND TECHNIQUE: Gray-scale sonography with graded compression, as well as color Doppler and duplex ultrasound were performed to evaluate the lower extremity deep venous systems from the level of the common femoral vein and including the common femoral, femoral, profunda femoral, popliteal and calf veins including the posterior tibial, peroneal and gastrocnemius veins when visible. The superficial great saphenous vein was also interrogated. Spectral Doppler was utilized to evaluate flow at rest and with distal augmentation maneuvers in the common femoral, femoral and popliteal veins. COMPARISON:  None. FINDINGS: RIGHT LOWER EXTREMITY Common Femoral Vein: No evidence of thrombus. Normal compressibility, respiratory phasicity and response to augmentation. Saphenofemoral Junction: No evidence of thrombus. Normal compressibility and flow on color Doppler imaging. Profunda Femoral Vein: No evidence of thrombus. Normal compressibility and flow on color Doppler imaging. Femoral Vein: No evidence of thrombus. Normal  compressibility, respiratory  phasicity and response to augmentation. Popliteal Vein: Thrombus is noted within which is nonocclusive in nature. Calf Veins: No evidence of thrombus. Normal compressibility and flow on color Doppler imaging. Superficial Great Saphenous Vein: No evidence of thrombus. Normal compressibility. Venous Reflux:  None. Other Findings:  None. LEFT LOWER EXTREMITY Common Femoral Vein: No evidence of thrombus. Normal compressibility, respiratory phasicity and response to augmentation. Saphenofemoral Junction: No evidence of thrombus. Normal compressibility and flow on color Doppler imaging. Profunda Femoral Vein: No evidence of thrombus. Normal compressibility and flow on color Doppler imaging. Femoral Vein: No evidence of thrombus. Normal compressibility, respiratory phasicity and response to augmentation. Popliteal Vein: No evidence of thrombus. Normal compressibility, respiratory phasicity and response to augmentation. Calf Veins: No evidence of thrombus. Normal compressibility and flow on color Doppler imaging. Superficial Great Saphenous Vein: No evidence of thrombus. Normal compressibility. Venous Reflux:  None. Other Findings:  None. IMPRESSION: Deep venous thrombosis within the right popliteal vein which is nonocclusive in nature. Incidental note is made of arterial calcifications bilaterally. Electronically Signed   By: Alcide Clever M.D.   On: 03/21/2018 14:01   Dg Chest Port 1 View  Result Date: 03/23/2018 CLINICAL DATA: Increasing shortness of breath. EXAM: PORTABLE CHEST 1 VIEW COMPARISON:  Chest radiograph March 20, 2018 FINDINGS: Cardiomediastinal silhouette is normal. Calcified aortic arch. No pleural effusions or focal consolidations. Trachea projects midline and there is no pneumothorax. Soft tissue planes and included osseous structures are non-suspicious. Left PICC distal tip projects in mid superior vena cava. Pigtailed catheters in upper abdomen. IMPRESSION: No acute  cardiopulmonary process. Left PICC distal tip projecting mid superior vena cava. No pneumothorax. Electronically Signed   By: Awilda Metro M.D.   On: 03/23/2018 19:07   US Abdominal Pelvic Art/vent Flow Doppler Limited  Result Date: 03/21/2018 CLINICAL DATA:  Right upper quadrant abdominal pain. EXAM: ULTRASOUND ABDOMEN LIMITED RIGHT UPPER QUADRANT COMPARISON:  CT scan of March 19, 2018. FINDINGS: Gallbladder: Status post cholecystectomy. Common bile duct: Diameter: 3.4 mm which is within normal limits. Catheter is noted within common bile duct consistent with gastrohepatic biliary drainage catheter as described on prior CT scan. Liver: No focal lesion identified. Within normal limits in parenchymal echogenicity. 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 IMPRESSION: Status post cholecystectomy. Gastrohepatic biliary drainage catheter is noted within common bile duct. Thrombosis of main portal vein is noted. Possible varices are noted in porta hepatis region suggesting collaterals. Electronically Signed   By: Lupita Raider, M.D.   On: 03/21/2018 14:07   [place] Ekg Site Rite  Result Date: 03/22/2018 If Site Rite image not attached, placement could not be confirmed due to current cardiac rhythm.  US Abdomen Limited Ruq  Result Date: 03/21/2018 CLINICAL DATA:  Right upper quadrant abdominal pain. EXAM: ULTRASOUND ABDOMEN LIMITED RIGHT UPPER QUADRANT COMPARISON:  CT scan of March 19, 2018. FINDINGS: Gallbladder: Status post cholecystectomy. Common bile duct: Diameter: 3.4 mm which is within normal limits. Catheter is noted within common bile duct consistent with gastrohepatic biliary drainage catheter as described on prior CT scan. Liver: No focal lesion identified. Within normal limits in parenchymal echogenicity. 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 IMPRESSION: Status post cholecystectomy. Gastrohepatic biliary  drainage catheter is noted within common bile duct. Thrombosis of main portal vein is noted. Possible varices are noted in porta hepatis region suggesting collaterals. Electronically Signed   By: Lupita Raider,  M.D.   On: 03/21/2018 14:07    Consults: Treatment Team:  Thana Farr, MD Pauletta Browns, MD   Subjective:    Overnight Issues:patient weaned off of Precedex, awake alert and doing well, started on argatroban yesterday for thrombocytopenia, DVT and nonischemic stroke. Appreciate neurology input  Objective:  Vital signs for last 24 hours: Temp:  [98.1 F (36.7 C)-98.3 F (36.8 C)] 98.2 F (36.8 C) (06/07 0100) Pulse Rate:  [61-81] 61 (06/07 0600) Resp:  [15-30] 15 (06/07 0600) BP: (107-165)/(69-143) 139/76 (06/07 0600) SpO2:  [95 %-100 %] 100 % (06/07 0600)  Hemodynamic parameters for last 24 hours:    Intake/Output from previous day: 06/06 0701 - 06/07 0700 In: 2404.6 [I.V.:2404.6] Out: 625 [Urine:625]  Intake/Output this shift: No intake/output data recorded.  Vent settings for last 24 hours:    Physical Exam:  Elderly appearing African-American male, on precedex Vital signs:       Please see the above listed vital signs HEENT:           Trachea midline, no thyromegaly noted, increased respiratory rate with accessory muscle utilization Cardiovascular:           Tachycardia appreciated Pulmonary:      Coarse rhonchi noted Abdominal:      Tender to palpation, prior surgical changes noted Extremities:     No clubbing cyanosis or edema noted Neurologic:      Patient moves all extremities    Assessment/Plan:  History of chronic pancreatitis, status post Whipple's procedure,admitted for nausea vomiting abdominal pain. Has had supportive care, rehydration, pain and nausea control, treated empirically with Rocephin.patient doing well, weaned off of Precedex, on 2 L nasal cannula with stable oxygenation and hemodynamics.  Encephalopathy. MRI did reveal  acute left basal ganglia stroke nonhemorrhagic. Appreciate neurology input. Were okay with anticoagulation  Prior history of DVT, Patient with thrombocytopenia, nonischemic stroke and deep venous thrombosis, hit panel sent off, started on argatroban. Platelet count 41, hemoglobin 9.4  Hypertension. Patient unable to take oral medications being treated with when necessary IV dosing    Anylah Scheib 03/25/2018  *Care during the described time interval was provided by me and/or other providers on the critical care team.  I have reviewed this patient's available data, including medical history, events of note, physical examination and test results as part of my evaluation.

## 2018-03-25 NOTE — Progress Notes (Signed)
ANTICOAGULATION CONSULT NOTE - Initial Consult  Pharmacy Consult for argatroban Indication: DVT  Allergies  Allergen Reactions  . 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 Morphine And Related Hives and Itching    Patient Measurements: Height: 5\' 5"  (165.1 cm) Weight: 149 lb 11.1 oz (67.9 kg) IBW/kg (Calculated) : 61.5  Vital Signs: Temp: 98.2 F (36.8 C) (06/07 0100) Temp Source: Oral (06/07 0100) BP: 165/92 (06/07 0100) Pulse Rate: 76 (06/07 0100)  Labs: Recent Labs    03/22/18 1010  03/24/18 0635 03/24/18 1019 03/24/18 1855 03/25/18 0031 03/25/18 0130 03/25/18 0408  HGB 8.9*   < > 9.7* 9.8*  --   --   --  9.4*  HCT 27.5*   < > 29.3* 30.1*  --   --   --  28.9*  PLT 56*   < > 45* 41*  --   --   --  PENDING  APTT  --   --   --   --  51*  --  88* 82*  LABPROT  --   --   --   --  20.1*  --   --   --   INR  --   --   --   --  1.73  --   --   --   CREATININE 1.10   < > 0.74  --   --  0.80  --  0.83  CKTOTAL 144  --   --   --   --   --   --   --    < > = values in this interval not displayed.    Estimated Creatinine Clearance: 70 mL/min (by C-G formula based on SCr of 0.83 mg/dL).   Medical History: Past Medical History:  Diagnosis Date  . Pancreatitis   . Patient denies medical problems     Medications:  Scheduled:  . aspirin  300 mg Rectal Daily  . carvedilol  3.125 mg Oral BID WC  . Chlorhexidine Gluconate Cloth  6 each Topical Q0600  . ipratropium-albuterol  3 mL Nebulization Q4H  . methylPREDNISolone (SOLU-MEDROL) injection  60 mg Intravenous Q24H  . mupirocin ointment  1 application Nasal BID  . pantoprazole (PROTONIX) IV  40 mg Intravenous Daily  . sodium chloride flush  10-40 mL Intracatheter Q12H  . sodium chloride flush  10-40 mL Intracatheter Q12H  . 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  Will decrease argatroban drip rate to 0.5 mcg/kg/min.    06/07 @ 0130 aPTT 88 secs therapeutic. Will continue current rate of 0.5 mcg/kg/min, and will recheck @ 0400. Current labs suggest a child-pugh score of 10 (Grade C), current rate is appropriate. Will f/u on CBC, CMP w/ am labs.  06/07 @ 0400 aPTT 82 secs therapeutic. Will continue current rate to 0.5 mcg/kg/min and will recheck next aPTT w/ am labs.  Thomasene Rippleavid  Renia Mikelson, PharmD 03/25/2018,4:59 AM

## 2018-03-25 NOTE — Consult Note (Signed)
Reason for Consult:confusino  Referring Physician: Dr. Lonn Georgia  CC: stroke/confusion  HPI: Joe [name] is an 72 y.o. male with a past medical history of chronic pancreatitis, status post Whipple's procedure,hypertension,deep venous thrombosis, presently Xarelto, presented with nausea, vomiting and abdominal pain.along with confusion and encephalopathy. Neurology consulted due to L BG small vessel disease infarct.     Past Medical History:  Diagnosis Date  . Pancreatitis   . Patient denies medical problems     Past Surgical History:  Procedure Laterality Date  . WHIPPLE PROCEDURE      Family History  Problem Relation Age of Onset  . Brain cancer Mother   . Heart disease Brother     Social History:  reports that he has been smoking cigarettes.  He has a 25.00 pack-year smoking history. He does not have any smokeless tobacco history on file. He reports that he drank alcohol. He reports that he has current or past drug history.  Allergies  Allergen Reactions  . 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 Morphine And Related Hives and Itching    Medications: I have reviewed the patient's current medications.   Physical Examination: Blood pressure (!) 148/79, pulse 74, temperature (!) 97.4 F (36.3 C), temperature source Axillary, resp. rate 20, height 5\' 5"  (1.651 m), weight 149 lb 11.1 oz (67.9 kg), SpO2 (!) 87 %. Much more awake,  Follows   Laboratory Studies:   Basic Metabolic Panel: Recent Labs  Lab 03/19/18 1149 03/20/18 0336 03/21/18 0552 03/22/18 1010 03/23/18 0850 03/24/18 0635 03/25/18 0031 03/25/18 0408  NA 137 140 142 143 141 139 141 142  K 3.2* 3.8 4.0 3.6 4.3 3.7 3.6 3.6  CL 110 116* 116* 119* 115* 115* 115* 116*  CO2 15* 15* 16* 17* 19* 19* 20* 21*  GLUCOSE 85 73 42* 103* 85 216* 138* 146*  BUN 16 25* 40* 34* 21* 24* 22* 22*  CREATININE 1.04 1.95* 1.91* 1.10 0.81 0.74 0.80 0.83  CALCIUM 8.4* 7.6* 8.0* 8.3* 8.6* 8.0* 8.3* 8.1*  MG 1.3* 1.3* 2.1  --   --   --   --   --      Liver Function Tests: Recent Labs  Lab 03/19/18 1149 03/20/18 0336 03/21/18 0552 03/24/18 1855 03/25/18 0031 03/25/18 0408  AST 52* 87* 84*  --  19 18  ALT 15* 28 34  --  18 17  ALKPHOS 100 78 63  --  63 68  BILITOT 2.9* 4.3* 5.1* 2.8* 2.7* 2.5*  PROT 6.4* 5.5* 6.0*  --  5.6* 5.6*  ALBUMIN 3.0* 2.4* 2.7*  --  2.2* 2.2*   Recent Labs  Lab 03/19/18 1149 03/20/18 0336 03/22/18 1010  LIPASE 312* 76* 21   Recent Labs  Lab 03/21/18 1226  AMMONIA 32    CBC: Recent Labs  Lab 03/19/18 1149  03/22/18 1010 03/23/18 0850 03/24/18 0635 03/24/18 1019 03/25/18 0408  WBC 4.9   < > 23.0* 18.6* 6.8 6.7 6.1  NEUTROABS 4.5  --   --   --   --  5.2  --   HGB 10.7*   < > 8.9* 10.4* 9.7* 9.8* 9.4*  HCT 33.2*   < > 27.5* 33.1* 29.3* 30.1* 28.9*  MCV 74.5*   < > 72.0* 73.4* 71.8* 72.2* 72.6*  PLT 129*   < > 56* 61* 45* 41* 41*   < > = values in this interval not displayed.    Cardiac Enzymes: Recent Labs  Lab 03/22/18 1010  CKTOTAL 144    BNP: Invalid input(s): POCBNP  CBG: Recent Labs  Lab 03/23/18 1731 03/23/18 2352 03/24/18 0557 03/24/18 1319 03/24/18 1931  GLUCAP 75 184* 188* 173* 142*    Microbiology: Results for orders placed or performed during the hospital encounter of 03/19/18  CULTURE, BLOOD (ROUTINE X 2) w Reflex to ID Panel     Status: None (Preliminary result)   Collection Time: 03/21/18 12:26 PM  Result Value Ref Range Status   Specimen Description BLOOD RIGHT MID LINE  Final   Special Requests   Final    BOTTLES DRAWN AEROBIC AND ANAEROBIC Blood Culture adequate volume   Culture   Final    NO GROWTH 4 DAYS Performed at [facility], [address] [place], [place] [address]    Report Status PENDING  Incomplete  CULTURE, BLOOD (ROUTINE X 2) w Reflex to ID Panel     Status: None (Preliminary result)   Collection Time: 03/21/18 12:29 PM  Result Value Ref Range Status   Specimen Description BLOOD LEFT ANTECUBITAL  Final   Special  Requests   Final    BOTTLES DRAWN AEROBIC AND ANAEROBIC Blood Culture adequate volume   Culture   Final    NO GROWTH 4 DAYS Performed at [facility], [address], [place], [place] [address]    Report Status PENDING  Incomplete  Urine Culture     Status: None   Collection Time: 03/21/18 12:46 PM  Result Value Ref Range Status   Specimen Description   Final    URINE, RANDOM Performed at [facility], [address], [place], [place] [address]    Special Requests   Final    NONE Performed at [facility], [address], [place], [place] [address]    Culture   Final    NO GROWTH Performed at [facility], [address] [place], [place] [address]    Report Status 03/22/2018 FINAL  Final  C difficile quick scan w PCR reflex     Status: None   Collection Time: 03/23/18  7:36 AM  Result Value Ref Range Status   C Diff antigen NEGATIVE NEGATIVE Final   C Diff toxin NEGATIVE NEGATIVE Final   C Diff interpretation No C. difficile detected.  Final    Comment: Performed at [facility], [address] [place], [place] [address]  MRSA PCR Screening     Status: Abnormal   Collection Time: 03/23/18  5:46 PM  Result Value Ref Range Status   MRSA by PCR POSITIVE (A) NEGATIVE Final    Comment:        The GeneXpert MRSA Assay (FDA approved for NASAL specimens only), is one component of a comprehensive MRSA colonization surveillance program. It is not intended to diagnose MRSA infection nor to guide or monitor treatment for MRSA infections. RESULT CALLED TO, READ BACK BY AND VERIFIED WITH: BARBARA THAO ON 03/23/18 AT 1942 JAG Performed at [facility], [address] [place], [place] [address]     Coagulation Studies: Recent Labs    03/24/18 1855  LABPROT 20.1*  INR 1.73    Urinalysis:  Recent Labs  Lab 03/20/18 1246  COLORURINE AMBER*  LABSPEC 1.015  PHURINE 5.0  GLUCOSEU NEGATIVE  HGBUR LARGE*   BILIRUBINUR NEGATIVE  KETONESUR NEGATIVE  PROTEINUR NEGATIVE  NITRITE NEGATIVE  LEUKOCYTESUR NEGATIVE    Lipid Panel:  No results found for: CHOL, TRIG, HDL, CHOLHDL, VLDL, LDLCALC  HgbA1C: No  results found for: HGBA1C  Urine Drug Screen:  No results found for: LABOPIA, COCAINSCRNUR, LABBENZ, AMPHETMU, THCU, LABBARB  Alcohol Level: No results for input(s): ETH in the last 168 hours.   Imaging: Mr Laqueta JeanBrain W ZOWo Contrast  Result Date: 03/23/2018 CLINICAL DATA:  Initial evaluation for acute altered mental status, unexplained EXAM: MRI HEAD WITHOUT AND WITH CONTRAST TECHNIQUE: Multiplanar, multiecho pulse sequences of the brain and surrounding structures were obtained without and with intravenous contrast. CONTRAST:  13mL MULTIHANCE GADOBENATE DIMEGLUMINE 529 MG/ML IV SOLN COMPARISON:  Prior CT from 03/24/2018. FINDINGS: Brain: Study moderately degraded by motion artifact. Diffuse prominence of the CSF containing spaces compatible with generalized cerebral atrophy. Confluent T2/FLAIR hyperintensity present within the periventricular and deep white matter both cerebral hemispheres most consistent with chronic small vessel ischemic change, advanced in nature. Scatter remote small volume right MCA territory cortical infarcts noted involving the right frontal and parietal lobes. Remote right thalamic lacunar infarct noted. 7 mm acute ischemic infarct involving the left caudate, best appreciated on coronal DWI sequence (series 101, image 28). No associated hemorrhage or mass effect. No other evidence for acute or subacute ischemia. Gray-white matter differentiation chronic intracranial hemorrhage. No mass lesion, midline shift or mass effect. No hydrocephalus. No extra-axial fluid collection. No abnormal enhancement. Major dural sinuses grossly patent. Pituitary gland suprasellar region grossly normal. Maintained. No evidence for acute or Vascular: Major intracranial vascular flow voids grossly maintained at  the skull base. Skull and upper cervical spine: Craniocervical junction not well evaluated on this motion degraded exam. Bone marrow signal intensity grossly normal. No scalp soft tissue abnormality. Sinuses/Orbits: Globes and orbital soft tissues grossly unremarkable. Paranasal sinuses largely clear. No appreciable mastoid effusion. Other: None. IMPRESSION: 1. 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 2. 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 Electronically Signed   By: Rise MuBenjamin  McClintock M.D.   On: 03/23/2018 14:48   Dg Chest Port 1 View  Result Date: 03/23/2018 CLINICAL DATA: Increasing shortness of breath. EXAM: PORTABLE CHEST 1 VIEW COMPARISON:  Chest radiograph March 20, 2018 FINDINGS: Cardiomediastinal silhouette is normal. Calcified aortic arch. No pleural effusions or focal consolidations. Trachea projects midline and there is no pneumothorax. Soft tissue planes and included osseous structures are non-suspicious. Left PICC distal tip projects in mid superior vena cava. Pigtailed catheters in upper abdomen. IMPRESSION: No acute cardiopulmonary process. Left PICC distal tip projecting mid superior vena cava. No pneumothorax. Electronically Signed   By: Awilda Metroourtnay  Bloomer M.D.   On: 03/23/2018 19:07     Assessment/Plan:  72 y.o. male with a past medical history of chronic pancreatitis, status post Whipple's procedure,hypertension,deep venous thrombosis, presently Xarelto, presented with nausea, vomiting and abdominal pain.along with confusion and encephalopathy. Neurology consulted due to L BG small vessel disease infarct.    Much more awake and following commands today. Stoke incidental in setting of small vessel disease.   Being anticoagulated  Con't ICU care   Pauletta BrownsZEYLIKMAN, Anay     03/25/2018, 11:27 AM

## 2018-03-25 NOTE — Progress Notes (Signed)
[facility] [place] [facility]   PATIENT NAME: Joe [name]    MR#:  [id number]  DATE OF BIRTH:  08/11/1946  SUBJECTIVE:   -Continues to be confused and agitated this morning. More awake however continues to be talkative this morning daughter in the room.  REVIEW OF SYSTEMS:  Review of Systems  Unable to perform ROS: Mental status change    DRUG ALLERGIES:   Allergies  Allergen Reactions  . 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 Morphine And Related Hives and Itching    VITALS:  Blood pressure (!) 154/91, pulse 78, temperature 98.3 F (36.8 C), resp. rate 17, height 5\' 5"  (1.651 m), weight 67.9 kg (149 lb 11.1 oz), SpO2 100 %.  PHYSICAL EXAMINATION:  Physical Exam   GENERAL:  72 y.o.-year-old patient lying in the bed, very restless.  EYES: Pupils equal, round, reactive to light and accommodation. No scleral icterus. Extraocular muscles intact.  HEENT: Head atraumatic, normocephalic. Oropharynx and nasopharynx clear.  NECK:  Supple, no jugular venous distention. No thyroid enlargement, no tenderness.  LUNGS: Normal breath sounds bilaterally, noted to have upper airway wheezing, no rales,rhonchi or crepitation.  Using accessory muscles of respiration.  Decreased bibasilar breath sounds.  Tachypneic. CARDIOVASCULAR: S1, S2 normal. No murmurs, rubs, or gallops.  ABDOMEN: Soft, diffuse tenderness all over, nondistended. Bowel sounds present. No organomegaly or mass.  EXTREMITIES: No pedal edema, cyanosis, or clubbing.  NEUROLOGIC:  Moving around in bed, alert and able to tell his name but confused.  PSYCHIATRIC: The patient is intermittently alert and hallucinating.  SKIN: No obvious rash, lesion, or ulcer.    LABORATORY PANEL:   CBC Recent Labs  Lab 03/25/18 0408  WBC 6.1  HGB 9.4*  HCT 28.9*  PLT 41*   ------------------------------------------------------------------------------------------------------------------  Chemistries  Recent Labs  Lab  03/21/18 0552  03/25/18 0408  NA 142   < > 142  K 4.0   < > 3.6  CL 116*   < > 116*  CO2 16*   < > 21*  GLUCOSE 42*   < > 146*  BUN 40*   < > 22*  CREATININE 1.91*   < > 0.83  CALCIUM 8.0*   < > 8.1*  MG 2.1  --   --   AST 84*   < > 18  ALT 34   < > 17  ALKPHOS 63   < > 68  BILITOT 5.1*   < > 2.5*   < > = values in this interval not displayed.   ------------------------------------------------------------------------------------------------------------------  Cardiac Enzymes No results for input(s): TROPONINI in the last 168 hours. ------------------------------------------------------------------------------------------------------------------  RADIOLOGY:  Dg Chest Port 1 View  Result Date: 03/23/2018 CLINICAL DATA: Increasing shortness of breath. EXAM: PORTABLE CHEST 1 VIEW COMPARISON:  Chest radiograph March 20, 2018 FINDINGS: Cardiomediastinal silhouette is normal. Calcified aortic arch. No pleural effusions or focal consolidations. Trachea projects midline and there is no pneumothorax. Soft tissue planes and included osseous structures are non-suspicious. Left PICC distal tip projects in mid superior vena cava. Pigtailed catheters in upper abdomen. IMPRESSION: No acute cardiopulmonary process. Left PICC distal tip projecting mid superior vena cava. No pneumothorax. Electronically Signed   By: Awilda Metroourtnay  Bloomer M.D.   On: 03/23/2018 19:07    EKG:  No orders found for this or any previous visit.  ASSESSMENT AND PLAN:   72 year old male with past medical history significant for chronic pancreatitis status post abdominal surgery for the same, history of  alcohol abuse presents to hospital secondary to nausea vomiting and abdominal pain  1.  Acute respiratory distress-with tachypnea and hypoxia -Respiratory rate in the 30s and sats 100% on 2 L now -CT chest with no acute findings, bibasilar atelectasis and mild pleural effusion noted. -Continue supplemental oxygen.  ABG with  worsening respiratory alkalosis -We will transfer to stepdown unit  2.-Metabolic encephalopathy- initial concern for withdrawal -could be secondary to acute CVA that was noted on MRI of the brain  But per daughters hasn't been drinking alcohol in 2 years -CT of the head without any acute findings -now off precedex gtt - Ammonia is within normal limits.   -Neurology consult appreciated. -Currently on rectal aspirin.  3.  Hypertension-unable to take oral medications.  Will start hydralazine PRN  4.  Sepsis-likely from UTI.  Currently on Rocephin.  .  Improving WBC  5.  DVT prophylaxis-history of prior DVT.  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  Thrombocytopenia-known history of liver disease.  Platelets dropped from 120 to 50.   Hold Xarelto.  Physical therapy once more alert.  Palliative care consult for goals of care-- appreciated. Patient currently is full code. Will transition to a DNR DNI been the daughters make the decision   All the records are reviewed and case discussed with Care Management/Social Workerr. Management plans discussed with the patient, family and they are in agreement.  CODE STATUS: Full Code  TOTAL  TIME SPENT IN TAKING CARE OF THIS PATIENT: 30 minutes.   POSSIBLE D/C IN few DAYS, DEPENDING ON CLINICAL CONDITION.   Enedina Finner M.D on 03/25/2018 at 3:50 PM  Between 7am to 6pm - Pager - [phone] After 6pm go to [email or web address] - password [employer]  [facility]  Office  [phone]  CC: Primary care physician; Patient, [name] Per

## 2018-03-25 NOTE — Progress Notes (Addendum)
ANTICOAGULATION CONSULT NOTE - Initial Consult  Pharmacy Consult for argatroban Indication: DVT  Allergies  Allergen Reactions  . 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 Morphine And Related Hives and Itching    Patient Measurements: Height: 5\' 5"  (165.1 cm) Weight: 149 lb 11.1 oz (67.9 kg) IBW/kg (Calculated) : 61.5  Vital Signs: Temp: 98.2 F (36.8 C) (06/07 0100) Temp Source: Oral (06/07 0100) BP: 165/92 (06/07 0100) Pulse Rate: 76 (06/07 0100)  Labs: Recent Labs    03/22/18 1010 03/23/18 0850 03/24/18 0635 03/24/18 1019 03/24/18 1855 03/25/18 0031 03/25/18 0130  HGB 8.9* 10.4* 9.7* 9.8*  --   --   --   HCT 27.5* 33.1* 29.3* 30.1*  --   --   --   PLT 56* 61* 45* 41*  --   --   --   APTT  --   --   --   --  51*  --  88*  LABPROT  --   --   --   --  20.1*  --   --   INR  --   --   --   --  1.73  --   --   CREATININE 1.10 0.81 0.74  --   --  0.80  --   CKTOTAL 144  --   --   --   --   --   --     Estimated Creatinine Clearance: 72.6 mL/min (by C-G formula based on SCr of 0.8 mg/dL).   Medical History: Past Medical History:  Diagnosis Date  . Pancreatitis   . Patient denies medical problems     Medications:  Scheduled:  . aspirin  300 mg Rectal Daily  . carvedilol  3.125 mg Oral BID WC  . Chlorhexidine Gluconate Cloth  6 each Topical Q0600  . ipratropium-albuterol  3 mL Nebulization Q4H  . methylPREDNISolone (SOLU-MEDROL) injection  60 mg Intravenous Q24H  . mupirocin ointment  1 application Nasal BID  . pantoprazole (PROTONIX) IV  40 mg Intravenous Daily  . sodium chloride flush  10-40 mL Intracatheter Q12H  . sodium chloride flush  10-40 mL Intracatheter Q12H  . 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   Will decrease argatroban drip rate to 0.5 mcg/kg/min.    06/07 @ 0130 aPTT 88 secs therapeutic. Will continue current rate of 0.5 mcg/kg/min, and will recheck @ 0400. Current labs suggest a child-pugh score of 10 (Grade C), current rate is appropriate. Will f/u on CBC, CMP w/ am labs.  Thomasene Rippleavid  Broxton Broady, PharmD 03/25/2018,2:53 AM

## 2018-03-25 NOTE — Progress Notes (Signed)
Report called to Victorino DikeJennifer, Charity fundraiserN.  All questions answered.  Will continue to monitor until transport.

## 2018-03-25 NOTE — Progress Notes (Addendum)
ANTICOAGULATION CONSULT NOTE - Initial Consult  Pharmacy Consult for argatroban Indication: DVT  Allergies  Allergen Reactions  . 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 Morphine And Related Hives and Itching    Patient Measurements: Height: 5\' 5"  (165.1 cm) Weight: 149 lb 11.1 oz (67.9 kg) IBW/kg (Calculated) : 61.5  Vital Signs: Temp: 97.9 F (36.6 C) (06/07 1200) Temp Source: Axillary (06/07 1200) BP: 100/59 (06/07 1300) Pulse Rate: 78 (06/07 1300)  Labs: Recent Labs    03/24/18 0635 03/24/18 1019 03/24/18 1855 03/25/18 0031 03/25/18 0130 03/25/18 0408  HGB 9.7* 9.8*  --   --   --  9.4*  HCT 29.3* 30.1*  --   --   --  28.9*  PLT 45* 41*  --   --   --  41*  APTT  --   --  51*  --  88* 82*  LABPROT  --   --  20.1*  --   --   --   INR  --   --  1.73  --   --   --   CREATININE 0.74  --   --  0.80  --  0.83    Estimated Creatinine Clearance: 70 mL/min (by C-G formula based on SCr of 0.83 mg/dL).   Medical History: Past Medical History:  Diagnosis Date  . Pancreatitis   . Patient denies medical problems     Medications:  Scheduled:  . aspirin  325 mg Oral Daily  . carvedilol  3.125 mg Oral BID WC  . Chlorhexidine Gluconate Cloth  6 each Topical Q0600  . ipratropium-albuterol  3 mL Nebulization Q4H  . methylPREDNISolone (SOLU-MEDROL) injection  60 mg Intravenous Q24H  . mirtazapine  15 mg Oral QHS  . mupirocin ointment  1 application Nasal BID  . pantoprazole  40 mg Oral BID AC  . sodium chloride flush  10-40 mL Intracatheter Q12H  . sodium chloride flush  10-40 mL Intracatheter Q12H  . 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 Patient currently with nonischemic stroke, DVT, and thrombocytopenia. Patient has had Whipple procedure. Patient ordered rivaroxaban as an outpatient (stopped 6/1). Patient being worked up for HIT - however no documented doses of heparin/enoxparian this  admission. Patient with Child-Pugh score of at least 8. Argatroban infusing at 0.875mcg/kg/hr.   Goal of Therapy:  aPTT 45-95 seconds Monitor platelets by anticoagulation protocol: Yes   Plan:  Continue argatroban 0.855mcg/kg/hr and obtain follow-up aPTT with am labs.   Pharmacy will continue to monitor and adjust per consult.   Chaze Hruska L 03/25/2018,2:08 PM

## 2018-03-25 NOTE — Progress Notes (Signed)
Daily Progress Note   Patient Name: Joe [name]       Date: 03/25/2018 DOB: [birth year]  Age: 72 y.o. MRN#: [id number] Attending Physician: Enedina Finner, MD Primary Care Physician: Patient, [name] Per Admit Date: 03/19/2018  Reason for Consultation/Follow-up: Establishing goals of care  Subjective: Patient lying in bed. More awake and somewhat alert today. He is able to identify his name and dob. Unable to identify location, president, and also his daughter who was at bedside. He continues to call her by another name. Daughter states he continues to have inappropriate conversations with no relation to topic. He is observed having hallucinations during assessment. Continues to state "ask that man to leave and stop talking." He then points and when told there is no one there he states "what is wrong with you he is standing right there trying to out talk you all". Daughter states he has been doing this on and off since admission. He denies any pain/discomfort.   Daughter and I spoke in regards to our goals of care discussion on yesterday. No current updates. She states he is to remain a full code for now as she has been unable to discuss with her sister. Her daughter graduated last night from high school and tried to keep the moment as pleasant as possible.   Chart Reviewed and report received from bedside RN.   Length of Stay: 6  Current Medications: Scheduled Meds:  . aspirin  325 mg Oral Daily  . carvedilol  3.125 mg Oral BID WC  . Chlorhexidine Gluconate Cloth  6 each Topical Q0600  . ipratropium-albuterol  3 mL Nebulization Q4H  . [START ON 03/26/2018] methylPREDNISolone (SOLU-MEDROL) injection  60 mg Intravenous Daily  . mirtazapine  15 mg Oral QHS  . mupirocin ointment  1 application Nasal BID    . pantoprazole  40 mg Oral BID AC  . sodium chloride flush  10-40 mL Intracatheter Q12H  . sodium chloride flush  10-40 mL Intracatheter Q12H  . sodium chloride flush  3 mL Intravenous Q12H    Continuous Infusions: . sodium chloride    . argatroban 0.5 mcg/kg/min (03/25/18 1300)  . dextrose 5 % and 0.45% NaCl 75 mL/hr at 03/25/18 1300    PRN Meds: sodium chloride, acetaminophen **OR** acetaminophen, albuterol, haloperidol lactate, hydrALAZINE, iohexol, iopamidol, labetalol, LORazepam,  ondansetron **OR** ondansetron (ZOFRAN) IV, sodium chloride flush, sodium chloride flush, sodium chloride flush  Physical Exam  Constitutional: Vital signs are normal. He is more awake and alert today. He has a sickly appearance. Oriented x2.  Cardiovascular: Normal heart sounds, intact distal pulses and normal pulses.  Pulmonary/Chest: Effort normal. He has decreased breath sounds.  Abdominal: Soft. Bowel sounds are normal.  Musculoskeletal:  Generalized weakness   Neurological: He is disoriented. Some hallucinations.  Skin: Skin is warm and dry.  Psychiatric: Cognition and memory are impaired. He expresses inappropriate judgment.  Nursing note and vitals reviewed.  Vital Signs: BP (!) 100/59   Pulse 78   Temp 97.9 F (36.6 C) (Axillary)   Resp 17   Ht 5\' 5"  (1.651 m)   Wt 67.9 kg (149 lb 11.1 oz)   SpO2 96%   BMI 24.91 kg/m  SpO2: SpO2: 96 % O2 Device: O2 Device: Nasal Cannula O2 Flow Rate: O2 Flow Rate (L/min): 2 L/min  Intake/output summary:   Intake/Output Summary (Last 24 hours) at 03/25/2018 1522 Last data filed at 03/25/2018 1300 Gross per 24 hour  Intake 2163.67 ml  Output 600 ml  Net 1563.67 ml   LBM: Last BM Date: 03/23/18 Baseline Weight: Weight: 59 kg (130 lb) Most recent weight: Weight: 67.9 kg (149 lb 11.1 oz)       Palliative Assessment/Data:PPS 30%   Flowsheet Rows     Most Recent Value  Intake Tab  Referral Department  Hospitalist  Unit at Time of Referral   Med/Surg Unit  Palliative Care Primary Diagnosis  Cardiac  Date Notified  03/23/18  Palliative Care Type  New Palliative care  Reason for referral  Clarify Goals of Care  Date of Admission  03/19/18  Date first seen by Palliative Care  03/24/18  # of days Palliative referral response time  1 Day(s)  # of days IP prior to Palliative referral  4  Clinical Assessment  Psychosocial & Spiritual Assessment  Palliative Care Outcomes      Patient Active Problem List   Diagnosis Date Noted  . Acute on chronic pancreatitis (HCC) 03/19/2018    Palliative Care Assessment & Plan   Patient Profile: 72 y.o. male admitted on 03/19/2018 from home with nausea, vomiting, and abdominal pain. He has a past medical history significant for chronic pancreatitis, s/p whipple surgery, essential hypertension, and DVT. On presentation to ER patient is somewhat confused and therefore most of the history was obtained from the daughter at the bedside. Patient resides with his brother who noted that the patient was having multiple episodes of nausea and vomiting prior to arrival. He then became increasingly weak and could not get out of bed and therefore he called EMS. In the ER patient continued to complain of generalized abdominal pain but no episodes of nausea or vomiting. Patient's blood work was consistent with mild acute pancreatitis. His CT scan of the abdomen pelvis showed postsurgical changes from his previous Whipple procedure but no evidence of acute pancreatitis. Patient denied any fever, cough, congestion, melena, hematochezia, or any other associated symptoms presently. 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 Palliative Medicine team consulted for goals of care discussion.   Recommendations/Plan:  FULL CODE at family's request. Daughter is going to have a discussion with sister as patient expressed his  wishes in the past not to be resuscitated or placed on life support. [name], is prepared to shift  to DNR/DNI however, she wants to make sure sister is aware and comfortable and not have any hard feelings, despite knowing father's wishes. She was unable to have discussion with sister on yesterday due to her daughter graduating high school. Plans to discuss today.   Continue to treat the treatable with watchful waiting. Family hopeful he will show some signs of mental and physical recovery.   Case Management consult for outpatient palliative services at discharge at daughter's request.   Palliative Medicine team will continue to support patient, family, and medical team during hospitalization.   Goals of Care and Additional Recommendations:  Limitations on Scope of Treatment: Full Scope Treatment  Code Status:    Code Status Orders  (From admission, onward)        Start     Ordered   03/19/18 1829  Full code  Continuous     03/19/18 1828    Code Status History    This patient has a current code status but no historical code status.    Advance Directive Documentation     Most Recent Value  Type of Advance Directive  Healthcare Power of Attorney  Pre-existing out of facility DNR order (yellow form or pink MOST form)  -  "MOST" Form in Place?  -       Prognosis:   Unable to determine guarded to poor in the setting of hypertension, altered mental status, decreased po intake, deconditioning, acute CVA, chronic pancreatitis  Discharge Planning:  To Be Determined with outpatient Palliative Services.   Care plan was discussed with patient's family and bedside RN.   Thank you for allowing the Palliative Medicine Team to assist in the care of this patient.   Total Time 35 min Prolonged Time Billed  No       Greater than 50%  of this time was spent counseling and coordinating care related to the above assessment and plan.  Willette AlmaNikki Pickenpack-Cousar, NP-BC Palliative  Medicine Team  Phone: [phone] Fax: [phone]   Please contact Palliative Medicine Team phone at [phone] for questions and concerns.

## 2018-03-26 ENCOUNTER — Inpatient Hospital Stay: Payer: Medicare Other

## 2018-03-26 DIAGNOSIS — I824Y1 Acute embolism and thrombosis of unspecified deep veins of right proximal lower extremity: Secondary | ICD-10-CM

## 2018-03-26 DIAGNOSIS — Z7982 Long term (current) use of aspirin: Secondary | ICD-10-CM

## 2018-03-26 DIAGNOSIS — I10 Essential (primary) hypertension: Secondary | ICD-10-CM

## 2018-03-26 DIAGNOSIS — K859 Acute pancreatitis without necrosis or infection, unspecified: Secondary | ICD-10-CM

## 2018-03-26 DIAGNOSIS — D696 Thrombocytopenia, unspecified: Secondary | ICD-10-CM

## 2018-03-26 DIAGNOSIS — Z86718 Personal history of other venous thrombosis and embolism: Secondary | ICD-10-CM

## 2018-03-26 DIAGNOSIS — Z79899 Other long term (current) drug therapy: Secondary | ICD-10-CM

## 2018-03-26 DIAGNOSIS — F17210 Nicotine dependence, cigarettes, uncomplicated: Secondary | ICD-10-CM

## 2018-03-26 DIAGNOSIS — E861 Hypovolemia: Secondary | ICD-10-CM

## 2018-03-26 LAB — CULTURE, BLOOD (ROUTINE X 2)
CULTURE: NO GROWTH
CULTURE: NO GROWTH
SPECIAL REQUESTS: ADEQUATE
SPECIAL REQUESTS: ADEQUATE

## 2018-03-26 LAB — CBC
HCT: 28.4 % — ABNORMAL LOW (ref 40.0–52.0)
Hemoglobin: 9.2 g/dL — ABNORMAL LOW (ref 13.0–18.0)
MCH: 23.6 pg — ABNORMAL LOW (ref 26.0–34.0)
MCHC: 32.4 g/dL (ref 32.0–36.0)
MCV: 72.9 fL — AB (ref 80.0–100.0)
PLATELETS: 44 10*3/uL — AB (ref 150–440)
RBC: 3.9 MIL/uL — ABNORMAL LOW (ref 4.40–5.90)
RDW: 21.4 % — AB (ref 11.5–14.5)
WBC: 5.3 10*3/uL (ref 3.8–10.6)

## 2018-03-26 LAB — APTT: aPTT: 47 seconds — ABNORMAL HIGH (ref 24–36)

## 2018-03-26 MED ORDER — PREDNISONE 50 MG PO TABS
50.0000 mg | ORAL_TABLET | Freq: Every day | ORAL | Status: DC
  Administered 2018-03-27 – 2018-03-29 (×3): 50 mg via ORAL
  Filled 2018-03-26 (×3): qty 1

## 2018-03-26 MED ORDER — IPRATROPIUM-ALBUTEROL 0.5-2.5 (3) MG/3ML IN SOLN
3.0000 mL | Freq: Four times a day (QID) | RESPIRATORY_TRACT | Status: DC
  Administered 2018-03-26: 3 mL via RESPIRATORY_TRACT
  Filled 2018-03-26: qty 3

## 2018-03-26 MED ORDER — FONDAPARINUX SODIUM 7.5 MG/0.6ML ~~LOC~~ SOLN
7.5000 mg | Freq: Every day | SUBCUTANEOUS | Status: DC
  Administered 2018-03-26 – 2018-03-29 (×4): 7.5 mg via SUBCUTANEOUS
  Filled 2018-03-26 (×4): qty 0.6

## 2018-03-26 NOTE — Progress Notes (Signed)
[facility] [place] [facility]   PATIENT NAME: Joe [name]    MR#:  [id number]  DATE OF BIRTH:  1946-05-31  SUBJECTIVE:   -Continues to be pleasantly confused. More awake however continues to be talkative this morning  REVIEW OF SYSTEMS:  Review of Systems  Unable to perform ROS: Mental status change    DRUG ALLERGIES:   Allergies  Allergen Reactions  . 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 Morphine And Related Hives and Itching    VITALS:  Blood pressure (!) 182/91, pulse 79, temperature 97.7 F (36.5 C), temperature source Oral, resp. rate 18, height 5\' 5"  (1.651 m), weight 67.9 kg (149 lb 11.1 oz), SpO2 100 %.  PHYSICAL EXAMINATION:  Physical Exam   GENERAL:  72 y.o.-year-old patient lying in the bed, very restless.  EYES: Pupils equal, round, reactive to light and accommodation. No scleral icterus. Extraocular muscles intact.  HEENT: Head atraumatic, normocephalic. Oropharynx and nasopharynx clear.  NECK:  Supple, no jugular venous distention. No thyroid enlargement, no tenderness.  LUNGS: Normal breath sounds bilaterally, noted to have upper airway wheezing, no rales,rhonchi or crepitation.  Using accessory muscles of respiration.  Decreased bibasilar breath sounds.  Tachypneic. CARDIOVASCULAR: S1, S2 normal. No murmurs, rubs, or gallops.  ABDOMEN: Soft, diffuse tenderness all over, nondistended. Bowel sounds present. No organomegaly or mass.  EXTREMITIES: No pedal edema, cyanosis, or clubbing.  NEUROLOGIC:  Moving around in bed, alert and able to tell his name but confused.  PSYCHIATRIC: The patient is intermittently alert and hallucinating.  SKIN: No obvious rash, lesion, or ulcer.    LABORATORY PANEL:   CBC Recent Labs  Lab 03/26/18 0534  WBC 5.3  HGB 9.2*  HCT 28.4*  PLT 44*   ------------------------------------------------------------------------------------------------------------------  Chemistries  Recent Labs  Lab 03/21/18 0552   03/25/18 0408  NA 142   < > 142  K 4.0   < > 3.6  CL 116*   < > 116*  CO2 16*   < > 21*  GLUCOSE 42*   < > 146*  BUN 40*   < > 22*  CREATININE 1.91*   < > 0.83  CALCIUM 8.0*   < > 8.1*  MG 2.1  --   --   AST 84*   < > 18  ALT 34   < > 17  ALKPHOS 63   < > 68  BILITOT 5.1*   < > 2.5*   < > = values in this interval not displayed.   ------------------------------------------------------------------------------------------------------------------  Cardiac Enzymes No results for input(s): TROPONINI in the last 168 hours. ------------------------------------------------------------------------------------------------------------------  RADIOLOGY:  Dg Chest Port 1 View  Result Date: 03/26/2018 CLINICAL DATA:  Lung crackles EXAM: PORTABLE CHEST 1 VIEW COMPARISON:  03/23/2018 chest radiograph. FINDINGS: Left PICC terminates at the cavoatrial junction. Stable cardiomediastinal silhouette with top-normal heart size. No pneumothorax. No pleural effusion. Left retrocardiac patchy opacity. No pulmonary edema. IMPRESSION: Nonspecific patchy left retrocardiac opacity, cannot exclude aspiration or pneumonia. Recommend follow-up PA and lateral post treatment chest radiographs in 4-6 weeks. Electronically Signed   By: Delbert Phenix M.D.   On: 03/26/2018 17:34    EKG:  No orders found for this or any previous visit.  ASSESSMENT AND PLAN:   72 year old male with past medical history significant for chronic pancreatitis status post abdominal surgery for the same, history of alcohol abuse presents to hospital secondary to nausea vomiting and abdominal pain  1.  Acute respiratory distress-with tachypnea  and hypoxia -Respiratory rate in the 30s and sats 100% on 2 L now -CT chest with no acute findings, bibasilar atelectasis and mild pleural effusion noted. -Continue supplemental oxygen.  ABG with worsening respiratory alkalosis -We will transfer to stepdown unit  2.-Metabolic encephalopathy- initial  concern for withdrawal -could be secondary to acute CVA that was noted on MRI of the brain  But per daughters hasn't been drinking alcohol in 2 years -CT of the head without any acute findings -now off precedex gtt - Ammonia is within normal limits.   -Neurology consult appreciated. -Currently on  aspirin.  3.  Hypertension-unable to take oral medications.  Will start hydralazine PRN  4.  Sepsis-likely from UTI.  Currently on Rocephin.  .  Improving WBC  5.  DVT prophylaxis-history of prior DVT.  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  Thrombocytopenia-known history of liver disease.  Platelets dropped from 120 to 50.   Hold Xarelto.  Physical therapy once more alert.  Palliative care consult for goals of care-- appreciated. Patient currently is full code. Will transition to a DNR DNI been the daughters make the decision   All the records are reviewed and case discussed with Care Management/Social Workerr. Management plans discussed with the patient, family and they are in agreement.  CODE STATUS: Full Code  TOTAL  TIME SPENT IN TAKING CARE OF THIS PATIENT: 30 minutes.   POSSIBLE D/C IN few DAYS, DEPENDING ON CLINICAL CONDITION.   Enedina FinnerSona Devani Odonnel M.D on 03/26/2018 at 7:43 PM  Between 7am to 6pm - Pager - [phone] After 6pm go to [email or web address] - password [email or web address]  [facility]  Office  [phone]  CC: Primary care physician; Patient, [name] Per

## 2018-03-26 NOTE — Consult Note (Signed)
Hematology/Oncology Consult note [facility] Telephone:([phone] [phone]  Patient Care Team: Patient, [name] Per as PCP - General (General Practice)   Name of the patient: Joe [name]  [id number]  21-Mar-1946   Date of visit: 03/26/18 REASON FOR COSULTATION:  Low platelet, DVT, current use of Argatroban. History of presenting illness-  72 y.o. 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  Patient is a very poor historian.  History obtained from reviewing medical charts. Patient's blood work was consistent with acute pancreatitis.,   With lipase elevated at 312. CT scan of the abdomen and pelvis showed postsurgical changes from previous Whipple procedure but no evidence of acute pancreatitis. On admission patient's platelet count was 128,000 which dropped acutely to 51,000 on day 2 of hospitalization.  Platelet states at the range of 41-61000 throughout his admission. Ultrasound of lower extremity bilaterally showed nonocclusive DVT in the right popliteal vein. There was a concern about HIT, HIT antibody was sent and patient was started on argatroban for anticoagulation.  Heparin-induced platelet antibody came back negative.  Oncology was consulted for further management.   Review of Systems  Unable to perform ROS: Mental acuity  Denies abdominal pain, bleeding.  Cannot be performed due to patient mental status.  Allergies  Allergen Reactions  . 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 Morphine And Related Hives and Itching    Patient Active Problem List   Diagnosis Date Noted  . Acute on chronic pancreatitis (HCC) 03/19/2018     Past Medical History:  Diagnosis Date  . Pancreatitis   . Patient denies medical problems      Past Surgical History:  Procedure Laterality Date  .  WHIPPLE PROCEDURE      Social History   Socioeconomic History  . Marital status: Single    Spouse name: Not on file  . Number of children: Not on file  . Years of education: Not on file  . Highest education level: Not on file  Occupational History  . Not on file  Social Needs  . Financial resource strain: Not on file  . Food insecurity:    Worry: Not on file    Inability: Not on file  . Transportation needs:    Medical: Not on file    Non-medical: Not on file  Tobacco Use  . Smoking status: Current Every Day Smoker    Packs/day: 0.50    Years: 50.00    Pack years: 25.00    Types: Cigarettes  . Smokeless tobacco: Current User  Substance and Sexual Activity  . Alcohol use: Not Currently  . Drug use: Not Currently  . Sexual activity: Not on file  Lifestyle  . Physical activity:    Days per week: Not on file    Minutes per session: Not on file  . Stress: Not on file  Relationships  . Social connections:    Talks on phone: Not on file    Gets together: Not on file    Attends religious service: Not on file    Active member of club or organization: Not on file    Attends meetings of clubs or organizations: Not on file    Relationship status: Not on file  . Intimate partner violence:    Fear of current or ex partner: Not on file  Emotionally abused: Not on file    Physically abused: Not on file    Forced sexual activity: Not on file  Other Topics Concern  . Not on file  Social History Narrative  . Not on file     Family History  Problem Relation Age of Onset  . Brain cancer Mother   . Heart disease Brother      Current Facility-Administered Medications:  .  0.9 %  sodium chloride infusion, 250 mL, Intravenous, PRN, Conforti, John, DO .  acetaminophen (TYLENOL) tablet 650 mg, 650 mg, Oral, Q6H PRN **OR** acetaminophen (TYLENOL) suppository 650 mg, 650 mg, Rectal, Q6H PRN, Sainani, Vivek J, MD .  albuterol (PROVENTIL) (2.5 MG/3ML) 0.083% nebulizer solution  2.5 mg, 2.5 mg, Nebulization, Q4H PRN, Arnaldo Natal, MD, 2.5 mg at 03/26/18 1614 .  argatroban 1 mg/mL infusion, 0.5 mcg/kg/min, Intravenous, Continuous, Enedina Finner, MD, Last Rate: 2 mL/hr at 03/26/18 1606, 0.5 mcg/kg/min at 03/26/18 1606 .  aspirin tablet 325 mg, 325 mg, Oral, Daily, Enedina Finner, MD, 325 mg at 03/26/18 0847 .  carvedilol (COREG) tablet 3.125 mg, 3.125 mg, Oral, BID WC, Enid Baas, MD, 3.125 mg at 03/26/18 1608 .  Chlorhexidine Gluconate Cloth 2 % PADS 6 each, 6 each, Topical, Q0600, Enid Baas, MD, 6 each at 03/26/18 0533 .  dextrose 5 %-0.45 % sodium chloride infusion, , Intravenous, Continuous, Kalisetti, Radhika, MD, Last Rate: 75 mL/hr at 03/26/18 0536 .  haloperidol lactate (HALDOL) injection 1-2 mg, 1-2 mg, Intravenous, Q6H PRN, Enid Baas, MD, 1 mg at 03/22/18 2216 .  hydrALAZINE (APRESOLINE) injection 10 mg, 10 mg, Intravenous, Q6H PRN, Enid Baas, MD, 10 mg at 03/23/18 1108 .  iohexol (OMNIPAQUE) 300 MG/ML solution 75 mL, 75 mL, Intravenous, Once PRN, Phineas Semen, MD .  iopamidol (ISOVUE-300) 61 % injection 75 mL, 75 mL, Intravenous, Once PRN, Phineas Semen, MD .  ipratropium-albuterol (DUONEB) 0.5-2.5 (3) MG/3ML nebulizer solution 3 mL, 3 mL, Nebulization, Q6H, Enedina Finner, MD .  labetalol (NORMODYNE,TRANDATE) injection 10 mg, 10 mg, Intravenous, Q2H PRN, Oralia Manis, MD, 10 mg at 03/22/18 2325 .  mirtazapine (REMERON) tablet 15 mg, 15 mg, Oral, QHS, Conforti, John, DO, 15 mg at 03/25/18 2205 .  mupirocin ointment (BACTROBAN) 2 % 1 application, 1 application, Nasal, BID, Enid Baas, MD, 1 application at 03/26/18 0846 .  ondansetron (ZOFRAN) tablet 4 mg, 4 mg, Oral, Q6H PRN **OR** ondansetron (ZOFRAN) injection 4 mg, 4 mg, Intravenous, Q6H PRN, Sainani, Vivek J, MD .  pantoprazole (PROTONIX) EC tablet 40 mg, 40 mg, Oral, BID AC, Enedina Finner, MD, 40 mg at 03/26/18 1608 .  [START ON 03/27/2018] predniSONE (DELTASONE)  tablet 50 mg, 50 mg, Oral, Q breakfast, Enedina Finner, MD .  sodium chloride flush (NS) 0.9 % injection 10-40 mL, 10-40 mL, Intracatheter, PRN, Arnaldo Natal, MD, 20 mL at 03/22/18 2122 .  sodium chloride flush (NS) 0.9 % injection 10-40 mL, 10-40 mL, Intracatheter, Q12H, Arnaldo Natal, MD, 10 mL at 03/26/18 0848 .  sodium chloride flush (NS) 0.9 % injection 10-40 mL, 10-40 mL, Intracatheter, Q12H, Enid Baas, MD, 10 mL at 03/26/18 0848 .  sodium chloride flush (NS) 0.9 % injection 10-40 mL, 10-40 mL, Intracatheter, PRN, Enid Baas, MD .  sodium chloride flush (NS) 0.9 % injection 3 mL, 3 mL, Intravenous, Q12H, Conforti, John, DO, 3 mL at 03/26/18 0848 .  sodium chloride flush (NS) 0.9 % injection 3 mL, 3 mL, Intravenous, PRN, Conforti, John, DO   Physical  exam: ECOG  Vitals:   03/26/18 0800 03/26/18 0810 03/26/18 1614 03/26/18 1624  BP:  (!) 162/92 (!) 182/91   Pulse:  72 79   Resp:      Temp:  98.5 F (36.9 C) 97.7 F (36.5 C)   TempSrc:  Oral Oral   SpO2: 100% 100% 100% 100%  Weight:      Height:       Physical Exam  Constitutional: He is oriented to person, place, and time. No distress.  HENT:  Head: Normocephalic and atraumatic.  Eyes: Pupils are equal, round, and reactive to light. EOM are normal. No scleral icterus.  Neck: Normal range of motion. Neck supple.  Cardiovascular: Normal rate, regular rhythm and normal heart sounds.  No murmur heard. Pulmonary/Chest: Effort normal and breath sounds normal. No respiratory distress. He has no wheezes.  Abdominal: Soft. He exhibits no distension and no mass. There is no tenderness.  Musculoskeletal: Normal range of motion. He exhibits no edema or tenderness.  Neurological: He is alert and oriented to person, place, and time. No cranial nerve deficit. He exhibits normal muscle tone. Coordination normal.  Skin: Skin is warm and dry. No rash noted. No erythema.  Psychiatric: Affect normal.        CMP  Latest Ref Rng & Units 03/25/2018  Glucose 65 - 99 mg/dL 161(W)  BUN 6 - 20 mg/dL 96(E)  Creatinine 4.54 - 1.24 mg/dL 0.98  Sodium 119 - 147 mmol/L 142  Potassium 3.5 - 5.1 mmol/L 3.6  Chloride 101 - 111 mmol/L 116(H)  CO2 22 - 32 mmol/L 21(L)  Calcium 8.9 - 10.3 mg/dL 8.1(L)  Total Protein 6.5 - 8.1 g/dL 8.2(N)  Total Bilirubin 0.3 - 1.2 mg/dL 2.5(H)  Alkaline Phos 38 - 126 U/L 68  AST 15 - 41 U/L 18  ALT 17 - 63 U/L 17   CBC Latest Ref Rng & Units 03/26/2018  WBC 3.8 - 10.6 K/uL 5.3  Hemoglobin 13.0 - 18.0 g/dL 5.6(O)  Hematocrit 13.0 - 52.0 % 28.4(L)  Platelets 150 - 440 K/uL 44(L)    Ct Abdomen Pelvis Wo Contrast  Result Date: 03/19/2018 CLINICAL DATA:  72 year old male with nausea, vomiting, diarrhea, abdominal and back pain. History of chronic pancreatitis. EXAM: CT ABDOMEN AND PELVIS WITHOUT CONTRAST TECHNIQUE: Multidetector CT imaging of the abdomen and pelvis was performed following the standard protocol without IV contrast. COMPARISON:  Prior CT scan of the chest 10/05/2009 FINDINGS: Lower chest: The visualized lower chest is clear. The intracardiac blood pool is hypodense relative to the adjacent myocardium consistent with anemia. No pericardial effusion. Unremarkable distal thoracic esophagus. Hepatobiliary: There are 2 gastro hepatic biliary drainage catheters. One extends from the common bile duct into the gastric lumen, and the second extends from the proximal small bowel into the stomach. Scattered pneumobilia is present and not unexpected. There is persistent dilatation of the right posterior bile ducts. No definite discrete hepatic lesion. Pancreas: Sequelae of chronic pancreatitis with multiple small pancreatic calcifications. Dilatation of the main pancreatic duct is present. Spleen: Within normal limits Adrenals/Urinary Tract: Unremarkable adrenal glands. Nonspecific perinephric stranding bilaterally worse on the left than the right. No hydronephrosis or nephrolithiasis.  Unremarkable ureters and bladder. Stomach/Bowel: Colonic diverticular disease without CT evidence of active inflammation. Surgical changes of prior right hemicolectomy. Surgical changes of prior partial gastrectomy. No evidence of a bowel obstruction or focal bowel wall thickening. Vascular/Lymphatic: Limited evaluation in the absence of intravenous contrast. Extensive atherosclerotic vascular calcifications. No aneurysm. Reproductive: Prostate  is unremarkable. Other: No abdominal wall hernia or abnormality. No abdominopelvic ascites. Musculoskeletal: Chronic bilateral L5 pars defects. Mild grade 1 anterolisthesis of L5 on S1. Multilevel degenerative disc disease. No acute fracture or malalignment. IMPRESSION: 1. Two gastro hepatic biliary drainage catheters are present, 1 extending from the stomach into the common bile duct, and the second from the stomach into the proximal small bowel. There is expected pneumobilia, however the right posterior bile ducts are slightly dilated. It is unclear if this is chronic, or an interval change without prior imaging for comparison. Recommend clinical correlation for signs and symptoms of cholangitis. 2. Extensive surgical changes including prior partial gastric resection with gastroenteric anastomosis and prior right hemicolectomy. No evidence of bowel obstruction. 3. Colonic diverticular disease without CT evidence of active inflammation. 4. The intracardiac blood pool is hypodense relative to the adjacent myocardium consistent with anemia. 5. Sequelae of chronic pancreatitis with likely chronic dilatation of the main pancreatic duct. 6. Chronic bilateral L5 pars defects with mild grade 1 anterolisthesis of L5 on S1. 7.  Aortic Atherosclerosis (ICD10-170.0) Electronically Signed   By: Malachy Moan M.D.   On: 03/19/2018 13:55   Dg Chest 1 View  Result Date: 03/20/2018 CLINICAL DATA:  Wheezing.  Slightly labored breathing. EXAM: CHEST  1 VIEW COMPARISON:  October 01, 2009 FINDINGS: Stents in the upper abdomen of uncertain etiology. A density over the lateral right lung was noted to represent a sclerotic bone lesion in 2010. No other nodules or masses. The heart size is borderline to mildly enlarged. There is a torturous thoracic aorta again identified. The mediastinum is unchanged given portable technique. No pneumothorax. No focal infiltrate. IMPRESSION: No acute abnormality. Electronically Signed   By: Gerome Sam III M.D   On: 03/20/2018 14:43   Ct Head Wo Contrast  Result Date: 03/21/2018 CLINICAL DATA:  72 year old male with history of chronic pancreatitis and alcohol abuse presenting with nausea, vomiting and abdominal pain. Altered level of consciousness. Initial encounter. EXAM: CT HEAD WITHOUT CONTRAST TECHNIQUE: Contiguous axial images were obtained from the base of the skull through the vertex without intravenous contrast. COMPARISON:  No comparison head CT. FINDINGS: Brain: No intracranial hemorrhage or CT evidence of large acute infarct. Moderate chronic microvascular changes. Global atrophy. No intracranial mass lesion noted on this unenhanced exam. Vascular: Vascular calcifications Skull: Negative Sinuses/Orbits: No acute orbital abnormality. Minimal exophthalmos. Visualized paranasal sinuses are clear. Other: Mastoid air cells and middle ear cavities are clear. IMPRESSION: No acute intracranial abnormality. Moderate chronic microvascular changes. Global atrophy. Electronically Signed   By: Lacy Duverney M.D.   On: 03/21/2018 11:45   Ct Angio Chest Pe W Or Wo Contrast  Result Date: 03/23/2018 CLINICAL DATA:  Acute onset of recurrent pancreatitis. Nausea, vomiting and generalized abdominal pain. Personal history of deep venous thrombosis. Assess for pulmonary embolus. EXAM: CT ANGIOGRAPHY CHEST WITH CONTRAST TECHNIQUE: Multidetector CT imaging of the chest was performed using the standard protocol during bolus administration of intravenous contrast.  Multiplanar CT image reconstructions and MIPs were obtained to evaluate the vascular anatomy. CONTRAST:  75mL ISOVUE-370 IOPAMIDOL (ISOVUE-370) INJECTION 76% COMPARISON:  CT of the chest performed 10/05/2009 FINDINGS: Cardiovascular:  There is no evidence of pulmonary embolus. The heart is normal in size. The thoracic aorta demonstrates scattered calcification. The great vessels are grossly unremarkable in appearance. Mediastinum/Nodes: The mediastinum is grossly unremarkable. No mediastinal lymphadenopathy is seen. No pericardial effusion is identified. The visualized portions of the thyroid gland are unremarkable. No axillary lymphadenopathy  is seen. Lungs/Pleura: Small right and trace left pleural effusions are noted, with associated atelectasis. Partial opacification of the bronchioles to the lower lobes may reflect debris or possibly aspiration. No pneumothorax is seen. No masses are identified. Upper Abdomen: The visualized portions of the liver and spleen are grossly unremarkable. The patient is status post cholecystectomy, with trace air noted at the gallbladder fossa. Two catheters are noted extending between the stomach and gallbladder fossa. Would correlate clinically as to the type of prior surgery, as the pancreas is still seen, with scattered pancreatic calcifications likely reflect sequelae of chronic pancreatitis. Musculoskeletal: No acute osseous abnormalities are identified. The visualized musculature is unremarkable in appearance. Review of the MIP images confirms the above findings. IMPRESSION: 1. No evidence of pulmonary embolus. 2. Small right and trace left pleural effusions, with associated atelectasis. Partial opacification of the bronchioles to the lower lung lobes may reflect debris or possibly aspiration. 3. Two catheters noted extending between the stomach and gallbladder fossa. Would correlate clinically as to the type of prior surgery, as the pancreas is still present. Trace air noted  at the gallbladder fossa, of uncertain significance. 4. Scattered pancreatic calcifications likely reflect sequelae of chronic pancreatitis. Aortic Atherosclerosis (ICD10-I70.0). Electronically Signed   By: Roanna RaiderJeffery  Chang M.D.   On: 03/23/2018 02:51   Mr Laqueta JeanBrain W EAWo Contrast  Result Date: 03/23/2018 CLINICAL DATA:  Initial evaluation for acute altered mental status, unexplained EXAM: MRI HEAD WITHOUT AND WITH CONTRAST TECHNIQUE: Multiplanar, multiecho pulse sequences of the brain and surrounding structures were obtained without and with intravenous contrast. CONTRAST:  13mL MULTIHANCE GADOBENATE DIMEGLUMINE 529 MG/ML IV SOLN COMPARISON:  Prior CT from 03/24/2018. FINDINGS: Brain: Study moderately degraded by motion artifact. Diffuse prominence of the CSF containing spaces compatible with generalized cerebral atrophy. Confluent T2/FLAIR hyperintensity present within the periventricular and deep white matter both cerebral hemispheres most consistent with chronic small vessel ischemic change, advanced in nature. Scatter remote small volume right MCA territory cortical infarcts noted involving the right frontal and parietal lobes. Remote right thalamic lacunar infarct noted. 7 mm acute ischemic infarct involving the left caudate, best appreciated on coronal DWI sequence (series 101, image 28). No associated hemorrhage or mass effect. No other evidence for acute or subacute ischemia. Gray-white matter differentiation chronic intracranial hemorrhage. No mass lesion, midline shift or mass effect. No hydrocephalus. No extra-axial fluid collection. No abnormal enhancement. Major dural sinuses grossly patent. Pituitary gland suprasellar region grossly normal. Maintained. No evidence for acute or Vascular: Major intracranial vascular flow voids grossly maintained at the skull base. Skull and upper cervical spine: Craniocervical junction not well evaluated on this motion degraded exam. Bone marrow signal intensity grossly  normal. No scalp soft tissue abnormality. Sinuses/Orbits: Globes and orbital soft tissues grossly unremarkable. Paranasal sinuses largely clear. No appreciable mastoid effusion. Other: None. IMPRESSION: 1. 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 2. 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 Electronically Signed   By: Rise MuBenjamin  McClintock M.D.   On: 03/23/2018 14:48   [place] Venous Img Lower Bilateral  Result Date: 03/21/2018 CLINICAL DATA:  Bilateral lower extremity pain and swelling EXAM: BILATERAL LOWER EXTREMITY VENOUS DOPPLER ULTRASOUND TECHNIQUE: Gray-scale sonography with graded compression, as well as color Doppler and duplex ultrasound were performed to evaluate the lower extremity deep venous systems from the level of the common femoral vein and including the common femoral, femoral, profunda femoral, popliteal and calf  veins including the posterior tibial, peroneal and gastrocnemius veins when visible. The superficial great saphenous vein was also interrogated. Spectral Doppler was utilized to evaluate flow at rest and with distal augmentation maneuvers in the common femoral, femoral and popliteal veins. COMPARISON:  None. FINDINGS: RIGHT LOWER EXTREMITY Common Femoral Vein: No evidence of thrombus. Normal compressibility, respiratory phasicity and response to augmentation. Saphenofemoral Junction: No evidence of thrombus. Normal compressibility and flow on color Doppler imaging. Profunda Femoral Vein: No evidence of thrombus. Normal compressibility and flow on color Doppler imaging. Femoral Vein: No evidence of thrombus. Normal compressibility, respiratory phasicity and response to augmentation. Popliteal Vein: Thrombus is noted within which is nonocclusive in nature. Calf Veins: No evidence of thrombus. Normal compressibility and flow on color Doppler imaging. Superficial Great Saphenous  Vein: No evidence of thrombus. Normal compressibility. Venous Reflux:  None. Other Findings:  None. LEFT LOWER EXTREMITY Common Femoral Vein: No evidence of thrombus. Normal compressibility, respiratory phasicity and response to augmentation. Saphenofemoral Junction: No evidence of thrombus. Normal compressibility and flow on color Doppler imaging. Profunda Femoral Vein: No evidence of thrombus. Normal compressibility and flow on color Doppler imaging. Femoral Vein: No evidence of thrombus. Normal compressibility, respiratory phasicity and response to augmentation. Popliteal Vein: No evidence of thrombus. Normal compressibility, respiratory phasicity and response to augmentation. Calf Veins: No evidence of thrombus. Normal compressibility and flow on color Doppler imaging. Superficial Great Saphenous Vein: No evidence of thrombus. Normal compressibility. Venous Reflux:  None. Other Findings:  None. IMPRESSION: Deep venous thrombosis within the right popliteal vein which is nonocclusive in nature. Incidental note is made of arterial calcifications bilaterally. Electronically Signed   By: Alcide Clever M.D.   On: 03/21/2018 14:01   Dg Chest Port 1 View  Result Date: 03/23/2018 CLINICAL DATA: Increasing shortness of breath. EXAM: PORTABLE CHEST 1 VIEW COMPARISON:  Chest radiograph March 20, 2018 FINDINGS: Cardiomediastinal silhouette is normal. Calcified aortic arch. No pleural effusions or focal consolidations. Trachea projects midline and there is no pneumothorax. Soft tissue planes and included osseous structures are non-suspicious. Left PICC distal tip projects in mid superior vena cava. Pigtailed catheters in upper abdomen. IMPRESSION: No acute cardiopulmonary process. Left PICC distal tip projecting mid superior vena cava. No pneumothorax. Electronically Signed   By: Awilda Metro M.D.   On: 03/23/2018 19:07   US Abdominal Pelvic Art/vent Flow Doppler Limited  Result Date: 03/21/2018 CLINICAL DATA:  Right  upper quadrant abdominal pain. EXAM: ULTRASOUND ABDOMEN LIMITED RIGHT UPPER QUADRANT COMPARISON:  CT scan of March 19, 2018. FINDINGS: Gallbladder: Status post cholecystectomy. Common bile duct: Diameter: 3.4 mm which is within normal limits. Catheter is noted within common bile duct consistent with gastrohepatic biliary drainage catheter as described on prior CT scan. Liver: No focal lesion identified. Within normal limits in parenchymal echogenicity. 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 IMPRESSION: Status post cholecystectomy. Gastrohepatic biliary drainage catheter is noted within common bile duct. Thrombosis of main portal vein is noted. Possible varices are noted in porta hepatis region suggesting collaterals. Electronically Signed   By: Lupita Raider, M.D.   On: 03/21/2018 14:07   [place] Ekg Site Rite  Result Date: 03/22/2018 If Site Rite image not attached, placement could not be confirmed due to current cardiac rhythm.  US Abdomen Limited Ruq  Result Date: 03/21/2018 CLINICAL DATA:  Right upper quadrant abdominal pain. EXAM: ULTRASOUND ABDOMEN LIMITED RIGHT UPPER QUADRANT COMPARISON:  CT scan of March 19, 2018.  FINDINGS: Gallbladder: Status post cholecystectomy. Common bile duct: Diameter: 3.4 mm which is within normal limits. Catheter is noted within common bile duct consistent with gastrohepatic biliary drainage catheter as described on prior CT scan. Liver: No focal lesion identified. Within normal limits in parenchymal echogenicity. 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 IMPRESSION: Status post cholecystectomy. Gastrohepatic biliary drainage catheter is noted within common bile duct. Thrombosis of main portal vein is noted. Possible varices are noted in porta hepatis region suggesting collaterals. Electronically Signed   By: Lupita Raider, M.D.   On: 03/21/2018 14:07    Assessment and plan- Patient  is a 72 y.o. male with past medical history of chronic pancreatitis, hypertension, previous history of DVT presented to hospital complaining abdominal pain, nausea vomiting and noted to have acute on chronic pancreatitis.  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  #Thrombocytopenia: It is unclear if patient has a history of HIT in the past.  Patient is not able to provide any history, and not sure even if he is taking Xarelto at home.  Heparin was listed as allergy on 03/24/2018, as a consequence of suspicion of HIT. No recent exposure of heparin in the past 30 days. Reviewed patient's past lab test and I do not see another HIT antibody documented in the chart. His thrombocytopenia is likely due to consumption from acute pancreatitis, and chronic liver disease.  Patient's platelets dropped with a 1 day of hospitalization which is not typical for HIT if no recent exposure of heparin.  He did have lower extremity thrombosis which can be a consequence of noncompliance with Xarelto.  Anyhow HIT antibody came back negative.  Argatroban needs dosing for hepatic impairment and would not be an option outpatient for him. 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   Thank you for allowing me to participate in the care of this patient.  Total face to face encounter time for this patient visit was 70 min. >50% of the time was  spent in counseling and coordination of care.   The Rickard Patience, MD, PhD Hematology Oncology [facility] Pager- [phone] 03/26/2018

## 2018-03-26 NOTE — Plan of Care (Signed)
  Problem: Education: Goal: Knowledge of General Education information will improve Outcome: Progressing   Problem: Education: Goal: Knowledge of Pancreatitis treatment and prevention will improve Outcome: Progressing   Problem: Health Behavior/Discharge Planning: Goal: Ability to formulate a plan to maintain an alcohol-free life will improve Outcome: Progressing   Problem: Nutritional: Goal: Ability to achieve adequate nutritional intake will improve Outcome: Progressing   Problem: Clinical Measurements: Goal: Complications related to the disease process, condition or treatment will be avoided or minimized Outcome: Progressing   

## 2018-03-26 NOTE — Progress Notes (Signed)
ANTICOAGULATION CONSULT NOTE - Initial Consult  Pharmacy Consult for argatroban Indication: DVT  Allergies  Allergen Reactions  . 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 Morphine And Related Hives and Itching    Patient Measurements: Height: 5\' 5"  (165.1 cm) Weight: 149 lb 11.1 oz (67.9 kg) IBW/kg (Calculated) : 61.5  Vital Signs: Temp: 98.7 F (37.1 C) (06/07 2339) Temp Source: Oral (06/07 2339) BP: 157/93 (06/07 2339) Pulse Rate: 72 (06/08 0223)  Labs: Recent Labs    03/24/18 0635 03/24/18 1019  03/24/18 1855 03/25/18 0031 03/25/18 0130 03/25/18 0408 03/26/18 0534  HGB 9.7* 9.8*  --   --   --   --  9.4*  --   HCT 29.3* 30.1*  --   --   --   --  28.9*  --   PLT 45* 41*  --   --   --   --  41*  --   APTT  --   --    < > 51*  --  88* 82* 47*  LABPROT  --   --   --  20.1*  --   --   --   --   INR  --   --   --  1.73  --   --   --   --   CREATININE 0.74  --   --   --  0.80  --  0.83  --    < > = values in this interval not displayed.    Estimated Creatinine Clearance: 70 mL/min (by C-G formula based on SCr of 0.83 mg/dL).   Medical History: Past Medical History:  Diagnosis Date  . Pancreatitis   . Patient denies medical problems     Medications:  Scheduled:  . aspirin  325 mg Oral Daily  . carvedilol  3.125 mg Oral BID WC  . Chlorhexidine Gluconate Cloth  6 each Topical Q0600  . ipratropium-albuterol  3 mL Nebulization Q6H  . methylPREDNISolone (SOLU-MEDROL) injection  60 mg Intravenous Daily  . mirtazapine  15 mg Oral QHS  . mupirocin ointment  1 application Nasal BID  . pantoprazole  40 mg Oral BID AC  . sodium chloride flush  10-40 mL Intracatheter Q12H  . sodium chloride flush  10-40 mL Intracatheter Q12H  . 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 Patient currently with nonischemic stroke, DVT, and thrombocytopenia. Patient has had Whipple procedure. Patient ordered  rivaroxaban as an outpatient (stopped 6/1). Patient being worked up for HIT - however no documented doses of heparin/enoxparian this admission. Patient with Child-Pugh score of at least 8. Argatroban infusing at 0.205mcg/kg/hr.   Goal of Therapy:  aPTT 45-95 seconds Monitor platelets by anticoagulation protocol: Yes   Plan:  06/08 @ 0500 aPTT 47 seconds. Therapeutic. Will continue rate of 0.5 mcg/kg/min and will recheck w/ am labs. plts trending down will continue to monitor.  Thomasene Rippleavid Gonsalo Cuthbertson, PharmD, BCPS Clinical Pharmacist 03/26/2018

## 2018-03-27 ENCOUNTER — Inpatient Hospital Stay: Payer: Medicare Other

## 2018-03-27 DIAGNOSIS — R410 Disorientation, unspecified: Secondary | ICD-10-CM

## 2018-03-27 LAB — CBC
HCT: 30 % — ABNORMAL LOW (ref 40.0–52.0)
Hemoglobin: 9.9 g/dL — ABNORMAL LOW (ref 13.0–18.0)
MCH: 23.7 pg — AB (ref 26.0–34.0)
MCHC: 32.9 g/dL (ref 32.0–36.0)
MCV: 72.2 fL — ABNORMAL LOW (ref 80.0–100.0)
PLATELETS: 83 10*3/uL — AB (ref 150–440)
RBC: 4.16 MIL/uL — ABNORMAL LOW (ref 4.40–5.90)
RDW: 21.1 % — AB (ref 11.5–14.5)
WBC: 8.6 10*3/uL (ref 3.8–10.6)

## 2018-03-27 LAB — APTT: APTT: 71 s — AB (ref 24–36)

## 2018-03-27 MED ORDER — IPRATROPIUM-ALBUTEROL 0.5-2.5 (3) MG/3ML IN SOLN
3.0000 mL | Freq: Three times a day (TID) | RESPIRATORY_TRACT | Status: DC
  Administered 2018-03-27 (×2): 3 mL via RESPIRATORY_TRACT
  Filled 2018-03-27 (×4): qty 3

## 2018-03-27 NOTE — Clinical Social Work Note (Signed)
The CSW attempted to meet with the patient at bedside to discuss discharge planning; however, he was asleep. The CSW has placed a call to the patient's daughter to discuss discharge planning. The CSW left a HIPPA compliant voicemail and is waiting for a return call. CSW will continue to follow.  Argentina PonderKaren Martha Assunta Pupo, MSW, Theresia MajorsLCSWA [phone]

## 2018-03-27 NOTE — Progress Notes (Signed)
[facility] [place] [facility]   PATIENT NAME: Joe McgregorWalter [name]    MR#:  [id number]  DATE OF BIRTH:  06/04/1946  SUBJECTIVE:   -Continues to be pleasantly confused. More awake however continues to be talkative this morning  REVIEW OF SYSTEMS:  Review of Systems  Unable to perform ROS: Mental status change    DRUG ALLERGIES:   Allergies  Allergen Reactions  . 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 Morphine And Related Hives and Itching    VITALS:  Blood pressure (!) 164/84, pulse 80, temperature 98.4 F (36.9 C), resp. rate 18, height 5\' 5"  (1.651 m), weight 67.9 kg (149 lb 11.1 oz), SpO2 100 %.  PHYSICAL EXAMINATION:  Physical Exam   GENERAL:  72 y.o.-year-old patient lying in the bed, very restless.  EYES: Pupils equal, round, reactive to light and accommodation. No scleral icterus. Extraocular muscles intact.  HEENT: Head atraumatic, normocephalic. Oropharynx and nasopharynx clear.  NECK:  Supple, no jugular venous distention. No thyroid enlargement, no tenderness.  LUNGS: Normal breath sounds bilaterally, noted to have upper airway wheezing, no rales,rhonchi or crepitation.  Using accessory muscles of respiration.  Decreased bibasilar breath sounds.  Tachypneic. CARDIOVASCULAR: S1, S2 normal. No murmurs, rubs, or gallops.  ABDOMEN: Soft, diffuse tenderness all over, nondistended. Bowel sounds present. No organomegaly or mass.  EXTREMITIES: No pedal edema, cyanosis, or clubbing.  NEUROLOGIC:  Moving around in bed, alert and able to tell his name but confused.  PSYCHIATRIC: The patient is intermittently alert and hallucinating.  SKIN: No obvious rash, lesion, or ulcer.    LABORATORY PANEL:   CBC Recent Labs  Lab 03/27/18 0509  WBC 8.6  HGB 9.9*  HCT 30.0*  PLT 83*   ------------------------------------------------------------------------------------------------------------------  Chemistries  Recent Labs  Lab 03/21/18 0552  03/25/18 0408  NA 142   < > 142   K 4.0   < > 3.6  CL 116*   < > 116*  CO2 16*   < > 21*  GLUCOSE 42*   < > 146*  BUN 40*   < > 22*  CREATININE 1.91*   < > 0.83  CALCIUM 8.0*   < > 8.1*  MG 2.1  --   --   AST 84*   < > 18  ALT 34   < > 17  ALKPHOS 63   < > 68  BILITOT 5.1*   < > 2.5*   < > = values in this interval not displayed.   ------------------------------------------------------------------------------------------------------------------  Cardiac Enzymes No results for input(s): TROPONINI in the last 168 hours. ------------------------------------------------------------------------------------------------------------------  RADIOLOGY:  Dg Chest Port 1 View  Result Date: 03/26/2018 CLINICAL DATA:  Lung crackles EXAM: PORTABLE CHEST 1 VIEW COMPARISON:  03/23/2018 chest radiograph. FINDINGS: Left PICC terminates at the cavoatrial junction. Stable cardiomediastinal silhouette with top-normal heart size. No pneumothorax. No pleural effusion. Left retrocardiac patchy opacity. No pulmonary edema. IMPRESSION: Nonspecific patchy left retrocardiac opacity, cannot exclude aspiration or pneumonia. Recommend follow-up PA and lateral post treatment chest radiographs in 4-6 weeks. Electronically Signed   By: Delbert PhenixJason A Poff M.D.   On: 03/26/2018 17:34    EKG:  No orders found for this or any previous visit.  ASSESSMENT AND PLAN:   72 year old male with past medical history significant for chronic pancreatitis status post abdominal surgery for the same, history of alcohol abuse presents to hospital secondary to nausea vomiting and abdominal pain  1.  Acute respiratory distress-with tachypnea and hypoxia -Respiratory  rate in the 30s and sats 100% on 2 L now -CT chest with no acute findings, bibasilar atelectasis and mild pleural effusion noted. -Continue supplemental oxygen.  ABG with worsening respiratory alkalosis -We will transfer to stepdown unit  2.-Metabolic encephalopathy- initial concern for withdrawal vs early  cognitive decline -could be secondary to acute CVA that was noted on MRI of the brain  But per daughters hasn't been drinking alcohol in 2 years -CT of the head without any acute findings -now off precedex gtt - Ammonia is within normal limits.   -Neurology consult appreciated. -Currently on  aspirin.  3.  Hypertension-unable to take oral medications.  Will start hydralazine PRN  4.  Sepsis-likely from UTI.  Currently on Rocephin.  .  Improving WBC  5.  DVT prophylaxis-history of prior DVT.  - xarelto held due to thrombocytopenia - repeat dopplers with non occlusive clot in right popliteal vein and also portal vein now on IV Agratroban---changed to Arixtra sq 7.5 mg qd per Dr Bethanne Ginger recommendation -oncolgy consult appreciated -HIT -- 0.228 (normal) -Continue teds and SCDs  6.  Thrombocytopenia-known history of liver disease.  Platelets dropped from 120 to 50.   Hold Xarelto.  Physical therapy to see pt  Palliative care consult for goals of care-- appreciated. Patient currently is full code. Will transition to a DNR DNI been the daughters make the decision   All the records are reviewed and case discussed with Care Management/Social Workerr. Management plans discussed with the patient, family and they are in agreement.  CODE STATUS: Full Code  TOTAL  TIME SPENT IN TAKING CARE OF THIS PATIENT: 30 minutes.   POSSIBLE D/C IN few DAYS, DEPENDING ON CLINICAL CONDITION.   Enedina Finner M.D on 03/27/2018 at 1:21 PM  Between 7am to 6pm - Pager - [phone] After 6pm go to [email or web address] - password [email or web address]  [facility]  Office  [phone]  CC: Primary care physician; Patient, [name] Per

## 2018-03-27 NOTE — Clinical Social Work Note (Signed)
Clinical Social Work Assessment  Patient Details  Name: Joe [name] MRN: [id number] Date of Birth: 05/08/1946  Date of referral:  03/27/18               Reason for consult:  Facility Placement                Permission sought to share information with:  Oceanographeracility Contact Representative Permission granted to share information::  Yes, Verbal Permission Granted  Name::        Agency::  [facility]  Relationship::     Contact Information:     Housing/Transportation Living arrangements for the past 2 months:  Single Family Home Source of Information:  Medical Team, Adult Children Patient Interpreter Needed:  None Criminal Activity/Legal Involvement Pertinent to Current Situation/Hospitalization:  No - Comment as needed Significant Relationships:  Adult Children, Community Support Lives with:  Adult Children Do you feel safe going back to the place where you live?  Yes Need for family participation in patient care:  Yes (Comment)(Patient is not at baseline mentation.)  Care giving concerns:  Patient is showing signs of deconditioning and may benefit from SNF placement. Judeth CornfieldStephanie provided verbal permission to begin the SNF referral and requested [facility] as Ms. Earlene PlaterDavis lives in [place].  Judeth CornfieldStephanie explained that the patient is typically alert and oriented x4 and is continent of both bowel and bladder. However, since becoming ill, the patient has become incontinent and struggles to feed himself. The patient is also showing signs of possible delirium such as trying to eat food that is not there. The patient has a pending consult for palliative and SLP.   The CSW has begun the referral process and will continue following for discharge facilitation. PT is pending.   Social Worker assessment / plan:  The CSW attempted to meet with the patient and family at bedside. No family was available, and the patient was sleeping. The patient's daughter  called the CSW in response to a voicemail left.   Employment status:  Retired Health and safety inspectornsurance information:  Medicare PT Recommendations:  Not assessed at this time Information / Referral to community resources:  Skilled Nursing Facility  Patient/Family's Response to care:  The patient is pleasantly confused. The patient's daughter thanked the CSW for assistance.  Patient/Family's Understanding of and Emotional Response to Diagnosis, Current Treatment, and Prognosis:  The patient's family is agreeable to the discharge plan and understand that PT is pending.  Emotional Assessment Appearance:  Appears older than stated age Attitude/Demeanor/Rapport:  Gracious Affect (typically observed):  Pleasant Orientation:  Oriented to Self Alcohol / Substance use:  Alcohol Use(Abstinent for at least 2 years) Psych involvement (Current and /or in the community):  No (Comment)  Discharge Needs  Concerns to be addressed:  Care Coordination, Discharge Planning Concerns, Cognitive Concerns Readmission within the last 30 days:  No Current discharge risk:  Chronically ill Barriers to Discharge:  Continued Medical Work up   [employer] CorporationKaren M Sharrell Krawiec, LCSW 03/27/2018, 4:43 PM

## 2018-03-27 NOTE — Progress Notes (Addendum)
ANTICOAGULATION CONSULT NOTE - Initial Consult  Pharmacy Consult for argatroban Indication: DVT  Allergies  Allergen Reactions  . 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 Morphine And Related Hives and Itching    Patient Measurements: Height: 5\' 5"  (165.1 cm) Weight: 149 lb 11.1 oz (67.9 kg) IBW/kg (Calculated) : 61.5  Vital Signs: Temp: 97.9 F (36.6 C) (06/08 2333) Temp Source: Oral (06/08 2333) BP: 156/85 (06/08 2333) Pulse Rate: 79 (06/08 2333)  Labs: Recent Labs    03/24/18 [id number] 03/24/18 1019 03/24/18 1855 03/25/18 0031  03/25/18 0408 03/26/18 0534 03/27/18 0509  HGB 9.7* 9.8*  --   --   --  9.4* 9.2*  --   HCT 29.3* 30.1*  --   --   --  28.9* 28.4*  --   PLT 45* 41*  --   --   --  41* 44*  --   APTT  --   --  51*  --    < > 82* 47* 71*  LABPROT  --   --  20.1*  --   --   --   --   --   INR  --   --  1.73  --   --   --   --   --   CREATININE 0.74  --   --  0.80  --  0.83  --   --    < > = values in this interval not displayed.    Estimated Creatinine Clearance: 70 mL/min (by C-G formula based on SCr of 0.83 mg/dL).   Medical History: Past Medical History:  Diagnosis Date  . Pancreatitis   . Patient denies medical problems     Medications:  Scheduled:  . aspirin  325 mg Oral Daily  . carvedilol  3.125 mg Oral BID WC  . Chlorhexidine Gluconate Cloth  6 each Topical Q0600  . fondaparinux (ARIXTRA) injection  7.5 mg Subcutaneous Daily  . ipratropium-albuterol  3 mL Nebulization TID  . mirtazapine  15 mg Oral QHS  . mupirocin ointment  1 application Nasal BID  . pantoprazole  40 mg Oral BID AC  . predniSONE  50 mg Oral Q breakfast  . sodium chloride flush  10-40 mL Intracatheter Q12H  . sodium chloride flush  10-40 mL Intracatheter Q12H  . 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 Patient currently with nonischemic stroke, DVT, and thrombocytopenia. Patient has had Whipple  procedure. Patient ordered rivaroxaban as an outpatient (stopped 6/1). Patient being worked up for HIT - however no documented doses of heparin/enoxparian this admission. Patient with Child-Pugh score of at least 8. Argatroban infusing at 0.295mcg/kg/hr.   Goal of Therapy:  aPTT 45-95 seconds Monitor platelets by anticoagulation protocol: Yes   Plan:  06/08 @ 0500 aPTT 47 seconds. Therapeutic. Will continue rate of 0.5 mcg/kg/min and will recheck w/ am labs. plts trending down will continue to monitor.  06/09 @ 0500 aPTT 71 seconds. Therapeutic. Will continue rate of 0.5 mcg/kg/min and will recheck w/ am labs. plts trending down will continue to monitor. Patient received first dose of arixtra 7.5 mg  Thomasene Rippleavid Theo Reither, PharmD, BCPS Clinical Pharmacist 03/27/2018

## 2018-03-27 NOTE — Plan of Care (Signed)
  Problem: Education: Goal: Knowledge of General Education information will improve Outcome: Progressing   Problem: Education: Goal: Knowledge of Pancreatitis treatment and prevention will improve Outcome: Progressing   Problem: Health Behavior/Discharge Planning: Goal: Ability to formulate a plan to maintain an alcohol-free life will improve Outcome: Progressing   Problem: Nutritional: Goal: Ability to achieve adequate nutritional intake will improve Outcome: Progressing   Problem: Clinical Measurements: Goal: Complications related to the disease process, condition or treatment will be avoided or minimized Outcome: Progressing   Problem: Education: Goal: Knowledge of disease or condition will improve Outcome: Progressing Goal: Knowledge of patient specific risk factors addressed and post discharge goals established will improve Outcome: Progressing

## 2018-03-27 NOTE — Progress Notes (Signed)
Patient resting in bed, attempted to get up a few times today. Reoriented to room, but still forgetful and confused. Contact precautions maintained. No signs of acute distress noted.

## 2018-03-27 NOTE — Progress Notes (Signed)
Hematology/Oncology Progress Note [facility] [phone] [phone]  Patient Care Team: Patient, [name] Per as PCP - General (General Practice)   Name of the patient: Joe [name]  [id number]  Feb 21, 1946  Date of visit: 03/27/18   INTERVAL HISTORY-  Patient was seen and examined at bedside.  He continues to be pleasantly confused.  No acute overnight events.  Plan he has improved.  No signs of bleeding.    Review of systems- Review of Systems  Unable to perform ROS: Mental status change    Allergies  Allergen Reactions  . 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 Morphine And Related Hives and Itching    Patient Active Problem List   Diagnosis Date Noted  . Acute pancreatitis   . Thrombocytopenia (HCC)   . Acute deep vein thrombosis (DVT) of proximal vein of right lower extremity (HCC)   . Acute on chronic pancreatitis (HCC) 03/19/2018     Past Medical History:  Diagnosis Date  . Pancreatitis   . Patient denies medical problems      Past Surgical History:  Procedure Laterality Date  . WHIPPLE PROCEDURE      Social History   Socioeconomic History  . Marital status: Single    Spouse name: Not on file  . Number of children: Not on file  . Years of education: Not on file  . Highest education level: Not on file  Occupational History  . Not on file  Social Needs  . Financial resource strain: Not on file  . Food insecurity:    Worry: Not on file    Inability: Not on file  . Transportation needs:    Medical: Not on file    Non-medical: Not on file  Tobacco Use  . Smoking status: Current Every Day Smoker    Packs/day: 0.50    Years: 50.00    Pack years: 25.00    Types: Cigarettes  . Smokeless tobacco: Current User  Substance and Sexual Activity  . Alcohol use: Not Currently  . Drug use: Not Currently  . Sexual activity: Not on file  Lifestyle  . Physical activity:    Days per week: Not on file    Minutes per session: Not on  file  . Stress: Not on file  Relationships  . Social connections:    Talks on phone: Not on file    Gets together: Not on file    Attends religious service: Not on file    Active member of club or organization: Not on file    Attends meetings of clubs or organizations: Not on file    Relationship status: Not on file  . Intimate partner violence:    Fear of current or ex partner: Not on file    Emotionally abused: Not on file    Physically abused: Not on file    Forced sexual activity: Not on file  Other Topics Concern  . Not on file  Social History Narrative  . Not on file     Family History  Problem Relation Age of Onset  . Brain cancer Mother   . Heart disease Brother      Current Facility-Administered Medications:  .  0.9 %  sodium chloride infusion, 250 mL, Intravenous, PRN, Conforti, John, DO .  acetaminophen (TYLENOL) tablet 650 mg, 650 mg, Oral, Q6H PRN **OR** acetaminophen (TYLENOL) suppository 650 mg, 650 mg, Rectal, Q6H PRN, Sainani, Vivek J, MD .  albuterol (PROVENTIL) (2.5 MG/3ML) 0.083%  nebulizer solution 2.5 mg, 2.5 mg, Nebulization, Q4H PRN, Arnaldo Natal, MD, 2.5 mg at 03/26/18 1614 .  aspirin tablet 325 mg, 325 mg, Oral, Daily, Enedina Finner, MD, 325 mg at 03/27/18 0901 .  carvedilol (COREG) tablet 3.125 mg, 3.125 mg, Oral, BID WC, Enid Baas, MD, 3.125 mg at 03/27/18 0901 .  Chlorhexidine Gluconate Cloth 2 % PADS 6 each, 6 each, Topical, Q0600, Enid Baas, MD, 6 each at 03/27/18 0509 .  fondaparinux (ARIXTRA) injection 7.5 mg, 7.5 mg, Subcutaneous, Daily, Enedina Finner, MD, 7.5 mg at 03/27/18 0901 .  hydrALAZINE (APRESOLINE) injection 10 mg, 10 mg, Intravenous, Q6H PRN, Enid Baas, MD, 10 mg at 03/23/18 1108 .  iohexol (OMNIPAQUE) 300 MG/ML solution 75 mL, 75 mL, Intravenous, Once PRN, Phineas Semen, MD .  iopamidol (ISOVUE-300) 61 % injection 75 mL, 75 mL, Intravenous, Once PRN, Phineas Semen, MD .  ipratropium-albuterol  (DUONEB) 0.5-2.5 (3) MG/3ML nebulizer solution 3 mL, 3 mL, Nebulization, TID, Enedina Finner, MD, 3 mL at 03/27/18 1322 .  labetalol (NORMODYNE,TRANDATE) injection 10 mg, 10 mg, Intravenous, Q2H PRN, Oralia Manis, MD, 10 mg at 03/22/18 2325 .  mirtazapine (REMERON) tablet 15 mg, 15 mg, Oral, QHS, Conforti, John, DO, 15 mg at 03/26/18 2214 .  mupirocin ointment (BACTROBAN) 2 % 1 application, 1 application, Nasal, BID, Enid Baas, MD, 1 application at 03/27/18 0901 .  ondansetron (ZOFRAN) tablet 4 mg, 4 mg, Oral, Q6H PRN **OR** ondansetron (ZOFRAN) injection 4 mg, 4 mg, Intravenous, Q6H PRN, Sainani, Vivek J, MD .  pantoprazole (PROTONIX) EC tablet 40 mg, 40 mg, Oral, BID AC, Enedina Finner, MD, 40 mg at 03/27/18 0901 .  predniSONE (DELTASONE) tablet 50 mg, 50 mg, Oral, Q breakfast, Enedina Finner, MD, 50 mg at 03/27/18 0901 .  sodium chloride flush (NS) 0.9 % injection 10-40 mL, 10-40 mL, Intracatheter, PRN, Arnaldo Natal, MD, 20 mL at 03/22/18 2122 .  sodium chloride flush (NS) 0.9 % injection 10-40 mL, 10-40 mL, Intracatheter, Q12H, Arnaldo Natal, MD, 10 mL at 03/27/18 0908 .  sodium chloride flush (NS) 0.9 % injection 10-40 mL, 10-40 mL, Intracatheter, Q12H, Enid Baas, MD, 10 mL at 03/27/18 0906 .  sodium chloride flush (NS) 0.9 % injection 10-40 mL, 10-40 mL, Intracatheter, PRN, Enid Baas, MD .  sodium chloride flush (NS) 0.9 % injection 3 mL, 3 mL, Intravenous, Q12H, Conforti, John, DO, 3 mL at 03/27/18 0909 .  sodium chloride flush (NS) 0.9 % injection 3 mL, 3 mL, Intravenous, PRN, Tora Kindred, DO   Physical exam:  Vitals:   03/26/18 2333 03/27/18 0759 03/27/18 0831 03/27/18 1324  BP: (!) 156/85  (!) 164/84   Pulse: 79  80   Resp: 16  18   Temp: 97.9 F (36.6 C)  98.4 F (36.9 C)   TempSrc: Oral     SpO2: 95% 98% 100% 95%  Weight:      Height:       Physical Exam  HENT:  Head: Normocephalic and atraumatic.  Eyes: Pupils are equal, round, and  reactive to light. EOM are normal. No scleral icterus.  Neck: Normal range of motion. Neck supple.  Cardiovascular: Normal rate, regular rhythm and normal heart sounds.  No murmur heard. Pulmonary/Chest: Effort normal. No respiratory distress.  Abdominal: Soft. He exhibits no distension.  Musculoskeletal: Normal range of motion. He exhibits no edema.  Neurological: He is alert. No cranial nerve deficit.  Skin: Skin is warm and dry. No erythema.  Psychiatric:  Pleasantly  confused.       CMP Latest Ref Rng & Units 03/25/2018  Glucose 65 - 99 mg/dL 161(W146(H)  BUN 6 - 20 mg/dL 96(E22(H)  Creatinine 4.540.61 - 1.24 mg/dL 0.980.83  Sodium 119135 - 147145 mmol/L 142  Potassium 3.5 - 5.1 mmol/L 3.6  Chloride 101 - 111 mmol/L 116(H)  CO2 22 - 32 mmol/L 21(L)  Calcium 8.9 - 10.3 mg/dL 8.1(L)  Total Protein 6.5 - 8.1 g/dL 8.2(N5.6(L)  Total Bilirubin 0.3 - 1.2 mg/dL 2.5(H)  Alkaline Phos 38 - 126 U/L 68  AST 15 - 41 U/L 18  ALT 17 - 63 U/L 17   CBC Latest Ref Rng & Units 03/27/2018  WBC 3.8 - 10.6 K/uL 8.6  Hemoglobin 13.0 - 18.0 g/dL 5.6(O9.9(L)  Hematocrit 13.040.0 - 52.0 % 30.0(L)  Platelets 150 - 440 K/uL 83(L)   RADIOGRAPHIC STUDIES: I have personally reviewed the radiological images as listed and agreed with the findings in the report. Ct Abdomen Pelvis Wo Contrast  Result Date: 03/19/2018 CLINICAL DATA:  72 year old male with nausea, vomiting, diarrhea, abdominal and back pain. History of chronic pancreatitis. EXAM: CT ABDOMEN AND PELVIS WITHOUT CONTRAST TECHNIQUE: Multidetector CT imaging of the abdomen and pelvis was performed following the standard protocol without IV contrast. COMPARISON:  Prior CT scan of the chest 10/05/2009 FINDINGS: Lower chest: The visualized lower chest is clear. The intracardiac blood pool is hypodense relative to the adjacent myocardium consistent with anemia. No pericardial effusion. Unremarkable distal thoracic esophagus. Hepatobiliary: There are 2 gastro hepatic biliary drainage  catheters. One extends from the common bile duct into the gastric lumen, and the second extends from the proximal small bowel into the stomach. Scattered pneumobilia is present and not unexpected. There is persistent dilatation of the right posterior bile ducts. No definite discrete hepatic lesion. Pancreas: Sequelae of chronic pancreatitis with multiple small pancreatic calcifications. Dilatation of the main pancreatic duct is present. Spleen: Within normal limits Adrenals/Urinary Tract: Unremarkable adrenal glands. Nonspecific perinephric stranding bilaterally worse on the left than the right. No hydronephrosis or nephrolithiasis. Unremarkable ureters and bladder. Stomach/Bowel: Colonic diverticular disease without CT evidence of active inflammation. Surgical changes of prior right hemicolectomy. Surgical changes of prior partial gastrectomy. No evidence of a bowel obstruction or focal bowel wall thickening. Vascular/Lymphatic: Limited evaluation in the absence of intravenous contrast. Extensive atherosclerotic vascular calcifications. No aneurysm. Reproductive: Prostate is unremarkable. Other: No abdominal wall hernia or abnormality. No abdominopelvic ascites. Musculoskeletal: Chronic bilateral L5 pars defects. Mild grade 1 anterolisthesis of L5 on S1. Multilevel degenerative disc disease. No acute fracture or malalignment. IMPRESSION: 1. Two gastro hepatic biliary drainage catheters are present, 1 extending from the stomach into the common bile duct, and the second from the stomach into the proximal small bowel. There is expected pneumobilia, however the right posterior bile ducts are slightly dilated. It is unclear if this is chronic, or an interval change without prior imaging for comparison. Recommend clinical correlation for signs and symptoms of cholangitis. 2. Extensive surgical changes including prior partial gastric resection with gastroenteric anastomosis and prior right hemicolectomy. No evidence of  bowel obstruction. 3. Colonic diverticular disease without CT evidence of active inflammation. 4. The intracardiac blood pool is hypodense relative to the adjacent myocardium consistent with anemia. 5. Sequelae of chronic pancreatitis with likely chronic dilatation of the main pancreatic duct. 6. Chronic bilateral L5 pars defects with mild grade 1 anterolisthesis of L5 on S1. 7.  Aortic Atherosclerosis (ICD10-170.0) Electronically Signed   By: Malachy MoanHeath  McCullough  M.D.   On: 03/19/2018 13:55   Dg Chest 1 View  Result Date: 03/20/2018 CLINICAL DATA:  Wheezing.  Slightly labored breathing. EXAM: CHEST  1 VIEW COMPARISON:  October 01, 2009 FINDINGS: Stents in the upper abdomen of uncertain etiology. A density over the lateral right lung was noted to represent a sclerotic bone lesion in 2010. No other nodules or masses. The heart size is borderline to mildly enlarged. There is a torturous thoracic aorta again identified. The mediastinum is unchanged given portable technique. No pneumothorax. No focal infiltrate. IMPRESSION: No acute abnormality. Electronically Signed   By: Gerome Sam III M.D   On: 03/20/2018 14:43   Ct Head Wo Contrast  Result Date: 03/21/2018 CLINICAL DATA:  72 year old male with history of chronic pancreatitis and alcohol abuse presenting with nausea, vomiting and abdominal pain. Altered level of consciousness. Initial encounter. EXAM: CT HEAD WITHOUT CONTRAST TECHNIQUE: Contiguous axial images were obtained from the base of the skull through the vertex without intravenous contrast. COMPARISON:  No comparison head CT. FINDINGS: Brain: No intracranial hemorrhage or CT evidence of large acute infarct. Moderate chronic microvascular changes. Global atrophy. No intracranial mass lesion noted on this unenhanced exam. Vascular: Vascular calcifications Skull: Negative Sinuses/Orbits: No acute orbital abnormality. Minimal exophthalmos. Visualized paranasal sinuses are clear. Other: Mastoid air cells  and middle ear cavities are clear. IMPRESSION: No acute intracranial abnormality. Moderate chronic microvascular changes. Global atrophy. Electronically Signed   By: Lacy Duverney M.D.   On: 03/21/2018 11:45   Ct Angio Chest Pe W Or Wo Contrast  Result Date: 03/23/2018 CLINICAL DATA:  Acute onset of recurrent pancreatitis. Nausea, vomiting and generalized abdominal pain. Personal history of deep venous thrombosis. Assess for pulmonary embolus. EXAM: CT ANGIOGRAPHY CHEST WITH CONTRAST TECHNIQUE: Multidetector CT imaging of the chest was performed using the standard protocol during bolus administration of intravenous contrast. Multiplanar CT image reconstructions and MIPs were obtained to evaluate the vascular anatomy. CONTRAST:  75mL ISOVUE-370 IOPAMIDOL (ISOVUE-370) INJECTION 76% COMPARISON:  CT of the chest performed 10/05/2009 FINDINGS: Cardiovascular:  There is no evidence of pulmonary embolus. The heart is normal in size. The thoracic aorta demonstrates scattered calcification. The great vessels are grossly unremarkable in appearance. Mediastinum/Nodes: The mediastinum is grossly unremarkable. No mediastinal lymphadenopathy is seen. No pericardial effusion is identified. The visualized portions of the thyroid gland are unremarkable. No axillary lymphadenopathy is seen. Lungs/Pleura: Small right and trace left pleural effusions are noted, with associated atelectasis. Partial opacification of the bronchioles to the lower lobes may reflect debris or possibly aspiration. No pneumothorax is seen. No masses are identified. Upper Abdomen: The visualized portions of the liver and spleen are grossly unremarkable. The patient is status post cholecystectomy, with trace air noted at the gallbladder fossa. Two catheters are noted extending between the stomach and gallbladder fossa. Would correlate clinically as to the type of prior surgery, as the pancreas is still seen, with scattered pancreatic calcifications likely  reflect sequelae of chronic pancreatitis. Musculoskeletal: No acute osseous abnormalities are identified. The visualized musculature is unremarkable in appearance. Review of the MIP images confirms the above findings. IMPRESSION: 1. No evidence of pulmonary embolus. 2. Small right and trace left pleural effusions, with associated atelectasis. Partial opacification of the bronchioles to the lower lung lobes may reflect debris or possibly aspiration. 3. Two catheters noted extending between the stomach and gallbladder fossa. Would correlate clinically as to the type of prior surgery, as the pancreas is still present. Trace air noted at the gallbladder  fossa, of uncertain significance. 4. Scattered pancreatic calcifications likely reflect sequelae of chronic pancreatitis. Aortic Atherosclerosis (ICD10-I70.0). Electronically Signed   By: Roanna Raider M.D.   On: 03/23/2018 02:51   Mr Laqueta Jean ZO Contrast  Result Date: 03/23/2018 CLINICAL DATA:  Initial evaluation for acute altered mental status, unexplained EXAM: MRI HEAD WITHOUT AND WITH CONTRAST TECHNIQUE: Multiplanar, multiecho pulse sequences of the brain and surrounding structures were obtained without and with intravenous contrast. CONTRAST:  13mL MULTIHANCE GADOBENATE DIMEGLUMINE 529 MG/ML IV SOLN COMPARISON:  Prior CT from 03/24/2018. FINDINGS: Brain: Study moderately degraded by motion artifact. Diffuse prominence of the CSF containing spaces compatible with generalized cerebral atrophy. Confluent T2/FLAIR hyperintensity present within the periventricular and deep white matter both cerebral hemispheres most consistent with chronic small vessel ischemic change, advanced in nature. Scatter remote small volume right MCA territory cortical infarcts noted involving the right frontal and parietal lobes. Remote right thalamic lacunar infarct noted. 7 mm acute ischemic infarct involving the left caudate, best appreciated on coronal DWI sequence (series 101, image  28). No associated hemorrhage or mass effect. No other evidence for acute or subacute ischemia. Gray-white matter differentiation chronic intracranial hemorrhage. No mass lesion, midline shift or mass effect. No hydrocephalus. No extra-axial fluid collection. No abnormal enhancement. Major dural sinuses grossly patent. Pituitary gland suprasellar region grossly normal. Maintained. No evidence for acute or Vascular: Major intracranial vascular flow voids grossly maintained at the skull base. Skull and upper cervical spine: Craniocervical junction not well evaluated on this motion degraded exam. Bone marrow signal intensity grossly normal. No scalp soft tissue abnormality. Sinuses/Orbits: Globes and orbital soft tissues grossly unremarkable. Paranasal sinuses largely clear. No appreciable mastoid effusion. Other: None. IMPRESSION: 1. 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 2. 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 Electronically Signed   By: Rise Mu M.D.   On: 03/23/2018 14:48   US Venous Img Lower Bilateral  Result Date: 03/21/2018 CLINICAL DATA:  Bilateral lower extremity pain and swelling EXAM: BILATERAL LOWER EXTREMITY VENOUS DOPPLER ULTRASOUND TECHNIQUE: Gray-scale sonography with graded compression, as well as color Doppler and duplex ultrasound were performed to evaluate the lower extremity deep venous systems from the level of the common femoral vein and including the common femoral, femoral, profunda femoral, popliteal and calf veins including the posterior tibial, peroneal and gastrocnemius veins when visible. The superficial great saphenous vein was also interrogated. Spectral Doppler was utilized to evaluate flow at rest and with distal augmentation maneuvers in the common femoral, femoral and popliteal veins. COMPARISON:  None. FINDINGS: RIGHT LOWER EXTREMITY Common  Femoral Vein: No evidence of thrombus. Normal compressibility, respiratory phasicity and response to augmentation. Saphenofemoral Junction: No evidence of thrombus. Normal compressibility and flow on color Doppler imaging. Profunda Femoral Vein: No evidence of thrombus. Normal compressibility and flow on color Doppler imaging. Femoral Vein: No evidence of thrombus. Normal compressibility, respiratory phasicity and response to augmentation. Popliteal Vein: Thrombus is noted within which is nonocclusive in nature. Calf Veins: No evidence of thrombus. Normal compressibility and flow on color Doppler imaging. Superficial Great Saphenous Vein: No evidence of thrombus. Normal compressibility. Venous Reflux:  None. Other Findings:  None. LEFT LOWER EXTREMITY Common Femoral Vein: No evidence of thrombus. Normal compressibility, respiratory phasicity and response to augmentation. Saphenofemoral Junction: No evidence of thrombus. Normal compressibility and flow on color Doppler imaging. Profunda Femoral Vein: No evidence of thrombus. Normal compressibility and flow  on color Doppler imaging. Femoral Vein: No evidence of thrombus. Normal compressibility, respiratory phasicity and response to augmentation. Popliteal Vein: No evidence of thrombus. Normal compressibility, respiratory phasicity and response to augmentation. Calf Veins: No evidence of thrombus. Normal compressibility and flow on color Doppler imaging. Superficial Great Saphenous Vein: No evidence of thrombus. Normal compressibility. Venous Reflux:  None. Other Findings:  None. IMPRESSION: Deep venous thrombosis within the right popliteal vein which is nonocclusive in nature. Incidental note is made of arterial calcifications bilaterally. Electronically Signed   By: Alcide Clever M.D.   On: 03/21/2018 14:01   Dg Chest Port 1 View  Result Date: 03/26/2018 CLINICAL DATA:  Lung crackles EXAM: PORTABLE CHEST 1 VIEW COMPARISON:  03/23/2018 chest radiograph. FINDINGS:  Left PICC terminates at the cavoatrial junction. Stable cardiomediastinal silhouette with top-normal heart size. No pneumothorax. No pleural effusion. Left retrocardiac patchy opacity. No pulmonary edema. IMPRESSION: Nonspecific patchy left retrocardiac opacity, cannot exclude aspiration or pneumonia. Recommend follow-up PA and lateral post treatment chest radiographs in 4-6 weeks. Electronically Signed   By: Delbert Phenix M.D.   On: 03/26/2018 17:34   Dg Chest Port 1 View  Result Date: 03/23/2018 CLINICAL DATA: Increasing shortness of breath. EXAM: PORTABLE CHEST 1 VIEW COMPARISON:  Chest radiograph March 20, 2018 FINDINGS: Cardiomediastinal silhouette is normal. Calcified aortic arch. No pleural effusions or focal consolidations. Trachea projects midline and there is no pneumothorax. Soft tissue planes and included osseous structures are non-suspicious. Left PICC distal tip projects in mid superior vena cava. Pigtailed catheters in upper abdomen. IMPRESSION: No acute cardiopulmonary process. Left PICC distal tip projecting mid superior vena cava. No pneumothorax. Electronically Signed   By: Awilda Metro M.D.   On: 03/23/2018 19:07   US Abdominal Pelvic Art/vent Flow Doppler Limited  Result Date: 03/21/2018 CLINICAL DATA:  Right upper quadrant abdominal pain. EXAM: ULTRASOUND ABDOMEN LIMITED RIGHT UPPER QUADRANT COMPARISON:  CT scan of March 19, 2018. FINDINGS: Gallbladder: Status post cholecystectomy. Common bile duct: Diameter: 3.4 mm which is within normal limits. Catheter is noted within common bile duct consistent with gastrohepatic biliary drainage catheter as described on prior CT scan. Liver: No focal lesion identified. Within normal limits in parenchymal echogenicity. 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 IMPRESSION: Status post cholecystectomy. Gastrohepatic biliary drainage catheter is noted within common bile duct. Thrombosis of main portal vein  is noted. Possible varices are noted in porta hepatis region suggesting collaterals. Electronically Signed   By: Lupita Raider, M.D.   On: 03/21/2018 14:07   [place] Ekg Site Rite  Result Date: 03/22/2018 If Site Rite image not attached, placement could not be confirmed due to current cardiac rhythm.  US Abdomen Limited Ruq  Result Date: 03/21/2018 CLINICAL DATA:  Right upper quadrant abdominal pain. EXAM: ULTRASOUND ABDOMEN LIMITED RIGHT UPPER QUADRANT COMPARISON:  CT scan of March 19, 2018. FINDINGS: Gallbladder: Status post cholecystectomy. Common bile duct: Diameter: 3.4 mm which is within normal limits. Catheter is noted within common bile duct consistent with gastrohepatic biliary drainage catheter as described on prior CT scan. Liver: No focal lesion identified. Within normal limits in parenchymal echogenicity. 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 IMPRESSION: Status post cholecystectomy. Gastrohepatic biliary drainage catheter is noted within common bile duct. Thrombosis of main portal vein is noted. Possible varices are noted in porta hepatis region suggesting collaterals. Electronically Signed   By: Lupita Raider, M.D.  On: 03/21/2018 14:07    Assessment and plan-  Patient is a 72 y.o. male with past medical history of chronic pancreatitis, hypertension, chronic liver disease, previous history of DVT presented to hospital complaining of abdominal pain, nausea vomiting and noted to have acute on chronic pancreatitis.  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  Reviewed patient's chart and I do not see another HIT antibody documented in the chart except the negative HIT antibody which was obtained during this admission.  When HIT was suspected during admission, Argatroban was started. Patient did have lower extremity thrombosis which is  nonocclusive, which can be a result of noncompliance with Xarelto.  Given that patient is a extremely poor historian and with confused mental status, it is not possible to clarify whether he does have an HIT in the past or not.   I discussed with Dr. Allena Katz that he most likely is going to a nursing home facility.Argatroban cannot be an outpatient for him.  Plus it need to be dosed with patient's hepatic impairment.  I recommend that he can be switched to Arixtra 7.5 mg once daily. Today his thrombocytopenia improved to 83,000, likely his baseline.  Most likely the thrombocytopenia secondary to consumption of acute pancreatitis.    Thank you for allowing me to participate in the care of this patient.   Rickard Patience, MD, PhD Hematology Oncology [facility] Pager- [phone] 03/27/2018

## 2018-03-27 NOTE — [place] FL2 (Signed)
[place] MEDICAID FL2 LEVEL OF CARE SCREENING TOOL     IDENTIFICATION  Patient Name: Elray McgregorWalter Lader Birthdate: 03/23/1946 Sex: male Admission Date (Current Location): 03/19/2018  [facility] [place]:  [place] ([id number]) Facility and Address:  [facility], [address], [place], [place] [address]      Provider Number: [id number]  Attending Physician Name and Address:  Enedina FinnerPatel, Sona, MD  Relative Name and Phone Number:       Current Level of Care: Hospital Recommended Level of Care: Skilled Nursing Facility Prior Approval Number:    Date Approved/Denied:   PASRR Number: [id number] )  Discharge Plan: SNF    Current Diagnoses: Patient Active Problem List   Diagnosis Date Noted  . Acute pancreatitis   . Thrombocytopenia (HCC)   . Acute deep vein thrombosis (DVT) of proximal vein of right lower extremity (HCC)   . Acute on chronic pancreatitis (HCC) 03/19/2018    Orientation RESPIRATION BLADDER Height & Weight     Self  Normal Incontinent Weight: 149 lb 11.1 oz (67.9 kg) Height:  5\' 5"  (165.1 cm)  BEHAVIORAL SYMPTOMS/MOOD NEUROLOGICAL BOWEL NUTRITION STATUS      Continent Diet(Diet: Heart Healthy )  AMBULATORY STATUS COMMUNICATION OF NEEDS Skin   Extensive Assist Verbally Normal                       Personal Care Assistance Level of Assistance  Bathing, Feeding, Dressing Bathing Assistance: Limited assistance Feeding assistance: Independent Dressing Assistance: Limited assistance     Functional Limitations Info  Sight, Hearing, Speech Sight Info: Adequate Hearing Info: Impaired Speech Info: Adequate    SPECIAL CARE FACTORS FREQUENCY  PT (By licensed PT), OT (By licensed OT)     PT Frequency: (5) OT Frequency: (5)            Contractures      Additional Factors Info  Code Status, Allergies, Isolation Precautions Code Status Info: (Full Code. ) Allergies Info: (Heparin, Morphine And Related)     Isolation  Precautions Info: (MRSA Nasal Swab. )     Current Medications (03/27/2018):  This is the current hospital active medication list Current Facility-Administered Medications  Medication Dose Route Frequency Provider Last Rate Last Dose  . 0.9 %  sodium chloride infusion  250 mL Intravenous PRN Conforti, John, DO      . acetaminophen (TYLENOL) tablet 650 mg  650 mg Oral Q6H PRN [place] SirenSainani, Vivek J, MD       Or  . acetaminophen (TYLENOL) suppository 650 mg  650 mg Rectal Q6H PRN [place] SirenSainani, Vivek J, MD      . albuterol (PROVENTIL) (2.5 MG/3ML) 0.083% nebulizer solution 2.5 mg  2.5 mg Nebulization Q4H PRN Arnaldo Nataliamond, Michael S, MD   2.5 mg at 03/26/18 1614  . aspirin tablet 325 mg  325 mg Oral Daily Enedina FinnerPatel, Sona, MD   325 mg at 03/27/18 0901  . carvedilol (COREG) tablet 3.125 mg  3.125 mg Oral BID WC Enid BaasKalisetti, Radhika, MD   3.125 mg at 03/27/18 0901  . Chlorhexidine Gluconate Cloth 2 % PADS 6 each  6 each Topical Q0600 Enid BaasKalisetti, Radhika, MD   6 each at 03/27/18 0509  . fondaparinux (ARIXTRA) injection 7.5 mg  7.5 mg Subcutaneous Daily Enedina FinnerPatel, Sona, MD   7.5 mg at 03/27/18 0901  . haloperidol lactate (HALDOL) injection 1-2 mg  1-2 mg Intravenous Q6H PRN Enid BaasKalisetti, Radhika, MD   1 mg at 03/22/18 2216  . hydrALAZINE (APRESOLINE) injection  10 mg  10 mg Intravenous Q6H PRN Enid Baas, MD   10 mg at 03/23/18 1108  . iohexol (OMNIPAQUE) 300 MG/ML solution 75 mL  75 mL Intravenous Once PRN Phineas Semen, MD      . iopamidol (ISOVUE-300) 61 % injection 75 mL  75 mL Intravenous Once PRN Phineas Semen, MD      . ipratropium-albuterol (DUONEB) 0.5-2.5 (3) MG/3ML nebulizer solution 3 mL  3 mL Nebulization TID Enedina Finner, MD   3 mL at 03/27/18 0759  . labetalol (NORMODYNE,TRANDATE) injection 10 mg  10 mg Intravenous Q2H PRN Oralia Manis, MD   10 mg at 03/22/18 2325  . mirtazapine (REMERON) tablet 15 mg  15 mg Oral QHS Conforti, John, DO   15 mg at 03/26/18 2214  . mupirocin ointment (BACTROBAN) 2 % 1  application  1 application Nasal BID Enid Baas, MD   1 application at 03/27/18 0901  . ondansetron (ZOFRAN) tablet 4 mg  4 mg Oral Q6H PRN Houston Siren, MD       Or  . ondansetron (ZOFRAN) injection 4 mg  4 mg Intravenous Q6H PRN Houston Siren, MD      . pantoprazole (PROTONIX) EC tablet 40 mg  40 mg Oral BID AC Enedina Finner, MD   40 mg at 03/27/18 0901  . predniSONE (DELTASONE) tablet 50 mg  50 mg Oral Q breakfast Enedina Finner, MD   50 mg at 03/27/18 0901  . sodium chloride flush (NS) 0.9 % injection 10-40 mL  10-40 mL Intracatheter PRN Arnaldo Natal, MD   20 mL at 03/22/18 2122  . sodium chloride flush (NS) 0.9 % injection 10-40 mL  10-40 mL Intracatheter Q12H Arnaldo Natal, MD   10 mL at 03/27/18 0908  . sodium chloride flush (NS) 0.9 % injection 10-40 mL  10-40 mL Intracatheter Q12H Enid Baas, MD   10 mL at 03/27/18 0906  . sodium chloride flush (NS) 0.9 % injection 10-40 mL  10-40 mL Intracatheter PRN Enid Baas, MD      . sodium chloride flush (NS) 0.9 % injection 3 mL  3 mL Intravenous Q12H Conforti, John, DO   3 mL at 03/27/18 0909  . sodium chloride flush (NS) 0.9 % injection 3 mL  3 mL Intravenous PRN Conforti, John, DO         Discharge Medications: Please see discharge summary for a list of discharge medications.  Relevant Imaging Results:  Relevant Lab Results:   Additional Information (SSN: [id number])  Tiwana Chavis, Darleen Crocker, LCSW

## 2018-03-28 DIAGNOSIS — E440 Moderate protein-calorie malnutrition: Secondary | ICD-10-CM

## 2018-03-28 DIAGNOSIS — R531 Weakness: Secondary | ICD-10-CM

## 2018-03-28 DIAGNOSIS — D696 Thrombocytopenia, unspecified: Secondary | ICD-10-CM

## 2018-03-28 DIAGNOSIS — R0989 Other specified symptoms and signs involving the circulatory and respiratory systems: Secondary | ICD-10-CM

## 2018-03-28 DIAGNOSIS — I824Y1 Acute embolism and thrombosis of unspecified deep veins of right proximal lower extremity: Secondary | ICD-10-CM

## 2018-03-28 DIAGNOSIS — F05 Delirium due to known physiological condition: Secondary | ICD-10-CM

## 2018-03-28 LAB — GLUCOSE, CAPILLARY
GLUCOSE-CAPILLARY: 145 mg/dL — AB (ref 65–99)
Glucose-Capillary: 67 mg/dL (ref 65–99)
Glucose-Capillary: 83 mg/dL (ref 65–99)

## 2018-03-28 MED ORDER — ENSURE ENLIVE PO LIQD
237.0000 mL | Freq: Two times a day (BID) | ORAL | Status: DC
  Administered 2018-03-29 (×2): 237 mL via ORAL

## 2018-03-28 MED ORDER — CARVEDILOL 3.125 MG PO TABS
6.2500 mg | ORAL_TABLET | Freq: Two times a day (BID) | ORAL | Status: DC
  Administered 2018-03-28 – 2018-03-29 (×2): 6.25 mg via ORAL
  Filled 2018-03-28 (×2): qty 2

## 2018-03-28 MED ORDER — PANCRELIPASE (LIP-PROT-AMYL) 12000-38000 UNITS PO CPEP
72000.0000 [IU] | ORAL_CAPSULE | Freq: Three times a day (TID) | ORAL | Status: DC
  Administered 2018-03-28 – 2018-03-29 (×3): 72000 [IU] via ORAL
  Filled 2018-03-28 (×4): qty 6

## 2018-03-28 MED ORDER — ADULT MULTIVITAMIN W/MINERALS CH
1.0000 | ORAL_TABLET | Freq: Every day | ORAL | Status: DC
  Administered 2018-03-29: 1 via ORAL
  Filled 2018-03-28: qty 1

## 2018-03-28 NOTE — Progress Notes (Signed)
Daily Progress Note   Patient Name: Joe [name]       Date: 03/28/2018 DOB: 06/19/1946  Age: 72 y.o. MRN#: [id number] Attending Physician: Enedina FinnerPatel, Sona, MD Primary Care Physician: Patient, [name] Per Admit Date: 03/19/2018  Reason for Consultation/Follow-up: Establishing goals of care  Subjective: Patient lying in bed. More awake and somewhat alert today. He is able to identify his name and dob. Unable to identify location. Daughter is at bedside and is feeding him. He denies pain and is tolerating lunch without complications.   Daughter and I spoke in regards to our goals of care discussion on last week. She states he is to remain a full code per discussion amongst her and her sister. She states they see some improvement in him and although they know what he has said in the past, they aren't prepared to honor that at this time, when he appears hopeful in some recovery.  He is somewhat upset regarding her understanding that patient was being discharged today.  Dr. Allena KatzPatel at the bedside with myself.  We both thoroughly explained to his daughter Judeth [name] the process of discharge and securing an outpatient facility for rehabilitation.  After discussion his daughter is more understanding and aware of the actual process.  Both her and her sister are in agreement for Mr. Joe [name] to be discharged to a skilled facility for rehabilitation.  Family has received a list from social work and are currently visiting and researching the facilities to make the best decision for them.  Daughter states a big factor is [place] in [place] and he lives here and they are hopeful to be able to find a centralized location for family to be able to see him.  They plan to have the decision made by tomorrow and notify social worker with  the decision.  Daughter is aware the patient is medically stable and able to discharge soon.  He is aware that he was not cooperative when physical therapy came to evaluate him earlier today.  Per Dr. Allena KatzPatel we will have physical therapy and speech to reattempt to evaluate him on today and provide a baseline for discharge.  She verbalizes that their wishes are still for him to have palliative services outpatient.  Chart Reviewed and report received from bedside RN.   Length of Stay: 9  Current Medications: Scheduled Meds:  . aspirin  325 mg Oral Daily  . carvedilol  6.25 mg Oral BID WC  . Chlorhexidine Gluconate Cloth  6 each Topical Q0600  . fondaparinux (ARIXTRA) injection  7.5 mg Subcutaneous Daily  . ipratropium-albuterol  3 mL Nebulization TID  . lipase/protease/amylase  72,000 Units Oral TID WC  . mirtazapine  15 mg Oral QHS  . mupirocin ointment  1 application Nasal BID  . pantoprazole  40 mg Oral BID AC  . predniSONE  50 mg Oral Q breakfast  . sodium chloride flush  10-40 mL Intracatheter Q12H  . sodium chloride flush  10-40 mL Intracatheter Q12H  . sodium chloride flush  3 mL Intravenous Q12H    Continuous Infusions: . sodium chloride      PRN Meds: sodium chloride, acetaminophen **OR** acetaminophen, albuterol, hydrALAZINE, iohexol, iopamidol, labetalol, ondansetron **OR** ondansetron (ZOFRAN) IV, sodium chloride flush, sodium chloride flush, sodium chloride flush  Physical Exam  Constitutional: Vital signs are normal. He is more awake and alert today. He has a sickly appearance. Oriented x2.  Cardiovascular: Normal heart sounds, intact distal pulses and normal pulses.  Pulmonary/Chest: Effort normal. He has decreased breath sounds.  Abdominal: Soft. Bowel sounds are normal.  Musculoskeletal:  Generalized weakness   Neurological: He is disoriented.   Skin: Skin is warm and dry.  Psychiatric: Cognition and memory are impaired. He expresses inappropriate judgment.    Nursing note and vitals reviewed.  Vital Signs: BP (!) 178/87 (BP Location: Right Arm)   Pulse 70   Temp 97.9 F (36.6 C) (Oral)   Resp 18   Ht 5\' 5"  (1.651 m)   Wt 67.9 kg (149 lb 11.1 oz)   SpO2 98%   BMI 24.91 kg/m  SpO2: SpO2: 98 % O2 Device: O2 Device: Room Air O2 Flow Rate: O2 Flow Rate (L/min): 2 L/min  Intake/output summary:   Intake/Output Summary (Last 24 hours) at 03/28/2018 1412 Last data filed at 03/28/2018 1122 Gross per 24 hour  Intake 33 ml  Output -  Net 33 ml   LBM: Last BM Date: 03/27/18 Baseline Weight: Weight: 59 kg (130 lb) Most recent weight: Weight: 67.9 kg (149 lb 11.1 oz)       Palliative Assessment/Data:PPS 30%   Flowsheet Rows     Most Recent Value  Intake Tab  Referral Department  Hospitalist  Unit at Time of Referral  Med/Surg Unit  Palliative Care Primary Diagnosis  Cardiac  Date Notified  03/23/18  Palliative Care Type  New Palliative care  Reason for referral  Clarify Goals of Care  Date of Admission  03/19/18  Date first seen by Palliative Care  03/24/18  # of days Palliative referral response time  1 Day(s)  # of days IP prior to Palliative referral  4  Clinical Assessment  Psychosocial & Spiritual Assessment  Palliative Care Outcomes      Patient Active Problem List   Diagnosis Date Noted  . Acute pancreatitis   . Thrombocytopenia (HCC)   . Acute deep vein thrombosis (DVT) of proximal vein of right lower extremity (HCC)   . Acute on chronic pancreatitis (HCC) 03/19/2018    Palliative Care Assessment & Plan   Patient Profile: 72 y.o. male admitted on 03/19/2018 from home with nausea, vomiting, and abdominal pain. He has a past medical history significant for chronic pancreatitis, s/p whipple surgery, essential hypertension, and DVT. On presentation to ER patient is somewhat confused and therefore most of the history was  obtained from the daughter at the bedside. Patient resides with his brother who noted that the patient  was having multiple episodes of nausea and vomiting prior to arrival. He then became increasingly weak and could not get out of bed and therefore he called EMS. In the ER patient continued to complain of generalized abdominal pain but no episodes of nausea or vomiting. Patient's blood work was consistent with mild acute pancreatitis. His CT scan of the abdomen pelvis showed postsurgical changes from his previous Whipple procedure but no evidence of acute pancreatitis. Patient denied any fever, cough, congestion, melena, hematochezia, or any other associated symptoms presently. 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 Palliative Medicine team consulted for goals of care discussion.   Recommendations/Plan:  FULL CODE at family's request.  [name] states that family has decided to continue with this decision despite knowing that her father has told him in the past he would not want to be a full code.  Family however feels that they have saw some improvement in his status over the past week and are hopeful that he will continue to improve.  Continue to treat the treatable with watchful waiting. Family hopeful he will show some signs of mental and physical recovery.   Case Management consult for outpatient palliative services at discharge at daughter's request.   Daughter is aware that patient will be discharged over the next day or so.  Family has a list of skilled facilities for rehabilitation and are currently researching their locations with hopes of making a decision by the end of today or first thing tomorrow.  Family is in agreement with patient being discharged to a facility for rehabilitation.  Pending PT/SLP evaluation.  Both physical therapy and speech were at the bedside prior to the end of my meeting with daughter and patient.  Palliative Medicine team will continue to support patient, family,  and medical team during hospitalization.   Goals of Care and Additional Recommendations:  Limitations on Scope of Treatment: Full Scope Treatment  Code Status:    Code Status Orders  (From admission, onward)        Start     Ordered   03/19/18 1829  Full code  Continuous     03/19/18 1828    Code Status History    This patient has a current code status but no historical code status.    Advance Directive Documentation     Most Recent Value  Type of Advance Directive  Healthcare Power of Attorney  Pre-existing out of facility DNR order (yellow form or pink MOST form)  -  "MOST" Form in Place?  -       Prognosis:   Unable to determine guarded to poor in the setting of hypertension, altered mental status, decreased po intake, deconditioning, acute CVA, chronic pancreatitis  Discharge Planning:  Skilled Nursing Facility for rehab with Palliative care service follow-up   Care plan was discussed with patient's family, Dr. Allena Katz, and bedside RN.   Thank you for allowing the Palliative Medicine Team to assist in the care of this patient.   Total Time 65 min Prolonged Time Billed  Yes      Greater than 50%  of this time was spent counseling and coordinating care related to the above assessment and plan.  Willette Alma, NP-BC Palliative Medicine Team  Phone: [phone] Fax: [phone]   Please contact Palliative Medicine Team phone at [phone]  for questions and concerns.

## 2018-03-28 NOTE — Evaluation (Addendum)
Clinical/Bedside Swallow Evaluation Patient Details  Name: Joe McgregorWalter Hargreaves MRN: [id number] Date of Birth: 09/04/1946  Today's Date: 03/28/2018 Time: SLP Start Time (ACUTE ONLY): 1345 SLP Stop Time (ACUTE ONLY): 1445 SLP Time Calculation (min) (ACUTE ONLY): 60 min  Past Medical History:  Past Medical History:  Diagnosis Date  . Pancreatitis   . Patient denies medical problems    Past Surgical History:  Past Surgical History:  Procedure Laterality Date  . WHIPPLE PROCEDURE     HPI:  Pt is a 72 y.o. male with a known history of ETOH abuse, tobacco abuse (currently), chronic pancreatitis, status post Whipple procedure, essential hypertension, previous history of DVT who presents to the hospital due to nausea vomiting and also abdominal pain.  Patient presently is somewhat confused and therefore most of the history obtained from the daughter at bedside.  Patient resides with his brother who noted that the patient was having multiple episodes of nausea vomiting today.  He then became increasingly weak and could not get out of bed and therefore he called EMS and he was brought to the ER for further evaluation.  In the ER patient continues to complain of generalized abdominal pain but has not had any further nausea vomiting.  Patient's blood work was consistent with mild acute pancreatitis.  His CT scan of the abdomen pelvis shows postsurgical changes from his previous Whipple procedure but no evidence of acute pancreatitis. Unsure of pt's baseline Cognitive status - per Dtr's description/report, it is possibly declined prior to recent illness - pt was residing w/ his Brother for assistance(for past ~6 months).    Assessment / Plan / Recommendation Clinical Impression  Pt appears to present w/ adequate oropharyngeal phase swallow function w/ toleration of trials of thin liquids via Cup and Straw, purees and small bites of mech soft foods moistened w/ no immediate, overt s/s of aspiration noted. Pt exhibited  no decline in respiratory status or vocal quality during/post the po trials presented. However, pt did exhibit min coughing while masticating (an extended time on Peaches - a mixed consistency). This coughing only occurred when he was eating the peaches, no other solid foods or when drinking liquids. Discussed, and educated on, the properties of mixed consistency foods and their inherent risk for dysphagia d/t the liquid in the foods. Suggested smaller pieces each time and draining the liquid from the food as able. Pt appears at reduced risk for aspiration from an oropharyngeal phase standpoint when following general aspiration precautions. During oral phase, bolus management and clearing was grossly [facility] w/ all bolus trials including moist solids given min extra time w/ increased textured trials. Pt required feeding support w/ trials d/t min weakness and Cognitive status but was able to hold Cup for self-drinking. SLP recommended a Dysphagia level 3 (cut meats, moist foods) for easier mastication AND having foods cut already d/t weakness in hands for fine cutting tasks; Thin liquids. Recommended general aspiration and Reflux precautions. Education was given on diet consistency including mixed consistency foods; general aspiration precautions; and Pill taking - use of a puree or ice cream for easier swallowing as needed. ST services will be available for any further education if needed while admitted. Recommend f/u w/ Dietician as needed for support - drink form may be easier for pt.  SLP Visit Diagnosis: Dysphagia, oropharyngeal phase (R13.12)(moreso w/ mixed consistency foods)    Aspiration Risk  Mild aspiration risk(w/ mixed consistency foods; Cognitive status)    Diet Recommendation  Dysphagia level 3 (reduced mixed  consistency foods as needed), moisten foods and cut small; Thin liquids. General aspiration precautions. Monitoring at meals.   Medication Administration: Whole meds with puree(for safer  swallowing as needed)    Other  Recommendations Recommended Consults: (Dietician f/u) Oral Care Recommendations: Oral care BID;Staff/trained caregiver to provide oral care Other Recommendations: (n/a)   Follow up Recommendations Skilled Nursing facility(TBD)      Frequency and Duration min 2x/week (if needed) 1 week       Prognosis Prognosis for Safe Diet Advancement: Fair(-Good) Barriers to Reach Goals: Cognitive deficits;Severity of deficits Barriers/Prognosis Comment: baseline Cognitive status      Swallow Study   General Date of Onset: 03/19/18 HPI: Pt is a 72 y.o. male with a known history of ETOH abuse, tobacco abuse (currently), chronic pancreatitis, status post Whipple procedure, essential hypertension, previous history of DVT who presents to the hospital due to nausea vomiting and also abdominal pain.  Patient presently is somewhat confused and therefore most of the history obtained from the daughter at bedside.  Patient resides with his brother who noted that the patient was having multiple episodes of nausea vomiting today.  He then became increasingly weak and could not get out of bed and therefore he called EMS and he was brought to the ER for further evaluation.  In the ER patient continues to complain of generalized abdominal pain but has not had any further nausea vomiting.  Patient's blood work was consistent with mild acute pancreatitis.  His CT scan of the abdomen pelvis shows postsurgical changes from his previous Whipple procedure but no evidence of acute pancreatitis. Unsure of pt's baseline Cognitive status - per Dtr's description/report, it is possibly declined prior to recent illness - pt was residing w/ his Brother for assistance in past ~6 months.  Type of Study: Bedside Swallow Evaluation Previous Swallow Assessment: none reported Diet Prior to this Study: Regular;Thin liquids Temperature Spikes Noted: No(wbc 8.6) Respiratory Status: Room air History of Recent  Intubation: No Behavior/Cognition: Alert;Cooperative;Pleasant mood;Confused;Distractible;Requires cueing(min+) Oral Cavity Assessment: Within Functional Limits Oral Care Completed by SLP: Recent completion by staff Oral Cavity - Dentition: Adequate natural dentition Vision: Functional for self-feeding Self-Feeding Abilities: Able to feed self;Needs assist;Needs set up(weak; Cognition) Patient Positioning: Upright in bed Baseline Vocal Quality: Normal(muttered speech at times(Cognition)) Volitional Cough: Strong;Congested Volitional Swallow: Able to elicit    Oral/Motor/Sensory Function Overall Oral Motor/Sensory Function: Within functional limits   Ice Chips Ice chips: Not tested   Thin Liquid Thin Liquid: Within functional limits Presentation: Cup;Self Fed;Straw(assisted; ~4-5 ozs total w/ tea and water drinking) Other Comments: pt encouraged to hold Cup to drink vs feeding it to him    Nectar Thick Nectar Thick Liquid: Not tested   Honey Thick Honey Thick Liquid: Not tested   Puree Puree: Within functional limits Presentation: Spoon;Self Fed(assisted; ) Other Comments: pt was a little shaky in his self-feeding   Solid   GO   Solid: Impaired Presentation: Spoon(fed; 6-7 trials of peaches, chicken) Oral Phase Impairments: Impaired mastication(increased time/effort) Oral Phase Functional Implications: Impaired mastication;Prolonged oral transit Pharyngeal Phase Impairments: Cough - Delayed(x2 when eating peaches (mixed consistency food)) Other Comments: discussed the inherent issues w/ mixed consistency foods and the increased risk for dysphagia w/ managing the liquid in the foods        Jerilynn Som, MS, CCC-SLP [place] Bong 03/28/2018,6:32 PM

## 2018-03-28 NOTE — Progress Notes (Addendum)
Paged Dr Allena KatzPatel regarding pt's blood sugar and refusal to drink. Awaiting orders.   [id number] : Spoke to Dr Allena KatzPatel. Recheck FSBS at 0900. No further orders.

## 2018-03-28 NOTE — Progress Notes (Signed)
Pt FSBS 67. Pt alert to self. Confused to location and situation. Pt refusing to drink OJ. Pt convinced staff are stealing his things and in his house. Pt unsuccessfully redirected.

## 2018-03-28 NOTE — Progress Notes (Signed)
PT Cancellation Note  Patient Details Name: Joe [name] MRN: [id number] DOB: 05/16/1946   Cancelled Treatment:    Reason Eval/Treat Not Completed: Patient declined, no reason specified  PT attempted to see pt this morning. Prior to attempt PT consulted nursing who stated that pt was very agitated this morning. PT entered room where pt was sitting on the EOB. Pt did not appear agitated however refused to work with therapy. Pt educated about PT and benefits of mobility and sitting up in chair. Despite multiple efforts pt continually declined, pt states that he would be open to work with PT at another time. Pt states that he is comfortable. Pt was left sitting EOB with bed alarm on. Nursing notified that pt is sitting EOB with bed alarm on.  Gates Jividen, SPT   Joe [name] 03/28/2018, 11:00 AM

## 2018-03-28 NOTE — Evaluation (Signed)
Physical Therapy Evaluation Patient Details Name: Joe [name] MRN: [id number] DOB: 03/12/1946 Today's Date: 03/28/2018   History of Present Illness  Patient is a 72 year old male admitted from home with nausea, vomiting, abdominal pain. Found to have pancreatitis.   Clinical Impression  Patient alert upon evaluation, initially refuses to get up and walk, but with encouragement agrees. Patient performed supine to sit without assistance, ambulated to door of room with rw, then walked to bathroom and back to bed without ad and assistance of one. Patient pleasant throughout assessment. Patient returned to bed with min assist to raise legs up onto bed. Patient will benefit from continued PT to address weakness and decreased functional mobility. Recommnend SNF at this time due to increased confusion/dementia and weakness.       Follow Up Recommendations SNF    Equipment Recommendations  Rolling walker with 5" wheels    Recommendations for Other Services       Precautions / Restrictions Precautions Precautions: None      Mobility  Bed Mobility Overal bed mobility: Needs Assistance             General bed mobility comments: patient requested assistance with raising legs back up onto bed. Required assistance for scooting up in bed.   Transfers Overall transfer level: Modified independent Equipment used: Rolling walker (2 wheeled)                Ambulation/Gait Ambulation/Gait assistance: Modified independent (Device/Increase time);Min guard   Assistive device: Rolling walker (2 wheeled) Gait Pattern/deviations: Step-to pattern;Decreased step length - right;Decreased step length - left     General Gait Details: patient initially ambulated to door and back to bed with rw, then reported he needed to have BM so ambulated to bathroom and back to bed without ad.   Stairs            Wheelchair Mobility    Modified Rankin (Stroke Patients Only)       Balance                                              Pertinent Vitals/Pain Pain Assessment: No/denies pain    Home Living Family/patient expects to be discharged to:: Private residence Living Arrangements: Other relatives Available Help at Discharge: Family Type of Home: House Home Access: Stairs to enter       Home Equipment: None      Prior Function Level of Independence: Independent               Hand Dominance        Extremity/Trunk Assessment        Lower Extremity Assessment Lower Extremity Assessment: Overall WFL for tasks assessed       Communication   Communication: No difficulties  Cognition Arousal/Alertness: Awake/alert Behavior During Therapy: WFL for tasks assessed/performed Overall Cognitive Status: History of cognitive impairments - at baseline                                 General Comments: daughter believes cognition is worsening      General Comments      Exercises     Assessment/Plan    PT Assessment Patient needs continued PT services  PT Problem List Decreased strength;Decreased mobility;Decreased safety awareness;Decreased activity tolerance;Decreased balance;Decreased cognition  PT Treatment Interventions Therapeutic activities;Gait training;Therapeutic exercise;Patient/family education;Functional mobility training    PT Goals (Current goals can be found in the Care Plan section)  Acute Rehab PT Goals Patient Stated Goal: to get stronger PT Goal Formulation: With family Time For Goal Achievement: 04/11/18 Potential to Achieve Goals: Good    Frequency Min 2X/week   Barriers to discharge        Co-evaluation               AM-PAC PT "6 Clicks" Daily Activity  Outcome Measure Difficulty turning over in bed (including adjusting bedclothes, sheets and blankets)?: Unable Difficulty moving from lying on back to sitting on the side of the bed? : Unable Difficulty sitting down on and  standing up from a chair with arms (e.g., wheelchair, bedside commode, etc,.)?: Unable Help needed moving to and from a bed to chair (including a wheelchair)?: A Little Help needed walking in hospital room?: A Little Help needed climbing 3-5 steps with a railing? : A Little 6 Click Score: 12    End of Session Equipment Utilized During Treatment: Gait belt Activity Tolerance: Patient tolerated treatment well Patient left: in bed;with bed alarm set;with call bell/phone within reach;with family/visitor present   PT Visit Diagnosis: Unsteadiness on feet (R26.81);Other abnormalities of gait and mobility (R26.89);Muscle weakness (generalized) (M62.81);History of falling (Z91.81);Difficulty in walking, not elsewhere classified (R26.2)    Time: 1400-1450 PT Time Calculation (min) (ACUTE ONLY): 50 min   Charges:   PT Evaluation $PT Eval Low Complexity: 1 Low     Oral Hallgren, PT, GCS 03/28/18,3:02 PM

## 2018-03-28 NOTE — Progress Notes (Addendum)
Paged Dr Allena KatzPatel at family request to discuss pt's case. SW at bedside to provide list of facilities to family. Family expressing concern about abruptness of change in plan. Provided emotional support.   1215: Pt's daughter very loud and agitated. Expressed concern over feeling that "the left hand doesn't understand what the right hand is doing". Asking for PT/OT/Speech eval results. Stating that she was told the "neurologist will update me on the results of the stroke. No one has ever told me what's going on". Educated by Primary RN that the MD will be paged to speak with her.

## 2018-03-28 NOTE — Progress Notes (Signed)
Initial Nutrition Assessment  DOCUMENTATION CODES:   Non-severe (moderate) malnutrition in context of chronic illness  INTERVENTION:  Provide Ensure Enlive po BID, each supplement provides 350 kcal and 20 grams of protein.   Provide daily MVI.  Provide assistance with meals.  NUTRITION DIAGNOSIS:   Moderate Malnutrition related to chronic illness(hx Whipple procedure, chronic pancreatitis, EtOH abuse, CVA) as evidenced by moderate fat depletion, moderate muscle depletion.  GOAL:   Patient will meet greater than or equal to 90% of their needs  MONITOR:   PO intake, Supplement acceptance, Labs, Weight trends, I & O's  REASON FOR ASSESSMENT:   Malnutrition Screening Tool    ASSESSMENT:   72 year old male with PMHx of chronic pancreatitis following Whipple procedure, HTN, hx of DVT, EtOH abuse who presented with N/V and abdominal pain and found to have acute respiratory distress with tachypnea and hypoxemia on supplemental oxygen, metabolic encephalopathy possibly related to CVA, sepsis from UTI.   -Following SLP evaluation today diet was downgraded to dysphagia 3 with thin liquids.  Met with patient and his daughter at bedside. Patient is confused so history provided by daughter. She reports that he has had a decreased appetite for a while now, but especially during this admission. She reports he is eating <50% of his meals now. She is ordering him multiple protein sources at each meal. For dinner she ordered meat loaf, pinto beans, corn, and milk. Patient enjoys milk and oral nutrition supplements. She reports he would probably enjoy vanilla Ensure.   UBW is around 145 lbs. RD obtained bed scale weight of 139 lbs today, so weight is starting to trend down. Daughter is unsure of time frame of weight loss.  Meal completion has not been recorded in chart the entire admission.  Medications reviewed and include: Creon 72000 units TID with meals, Remeron 15 mg QHS,  pantoprazole.  Labs reviewed: CBG 67-145.  Discussed with RN.  NUTRITION - FOCUSED PHYSICAL EXAM:    Most Recent Value  Orbital Region  Moderate depletion  Upper Arm Region  Moderate depletion  Thoracic and Lumbar Region  Moderate depletion  Buccal Region  Moderate depletion  Temple Region  Moderate depletion  Clavicle Bone Region  Mild depletion  Clavicle and Acromion Bone Region  Moderate depletion  Scapular Bone Region  Unable to assess  Dorsal Hand  Moderate depletion  Patellar Region  Moderate depletion  Anterior Thigh Region  Moderate depletion  Posterior Calf Region  Moderate depletion  Edema (RD Assessment)  None  Hair  Reviewed  Eyes  Reviewed  Mouth  Unable to assess  Skin  Reviewed  Nails  Reviewed     Diet Order:   Diet Order           DIET DYS 3 Room service appropriate? Yes with Assist; Fluid consistency: Thin  Diet effective now          EDUCATION NEEDS:   Not appropriate for education at this time  Skin:  Skin Assessment: Reviewed RN Assessment  Last BM:  03/28/2018 - small type 5  Height:   Ht Readings from Last 1 Encounters:  03/23/18 5' 5"  (1.651 m)    Weight:   Wt Readings from Last 1 Encounters:  03/28/18 139 lb (63 kg)    Ideal Body Weight:  61.8 kg  BMI:  Body mass index is 23.13 kg/m.  Estimated Nutritional Needs:   Kcal:  1575-1835 (MSJ x 1.2-1.4)  Protein:  75-90 grams (1.2-1.4 grams/kg)  Fluid:  1.5 L/day (25 mL/kg)  Willey Blade, MS, RD, LDN Office: [phone] Pager: [phone] After Hours/Weekend Pager: [phone]

## 2018-03-28 NOTE — Progress Notes (Signed)
Primary RN able to coax pt to take medications in applesauce. Pt ate aprox 1/2 applesauce container. Pt refused further.

## 2018-03-28 NOTE — Progress Notes (Signed)
[facility] [place] [facility]   PATIENT NAME: Joe [name]    MR#:  [id number]  DATE OF BIRTH:  1945-11-13  SUBJECTIVE:   -Continues to be pleasantly confused.  -Eating much better. Daughter in the room  REVIEW OF SYSTEMS:  Review of Systems  Unable to perform ROS: Mental status change    DRUG ALLERGIES:   Allergies  Allergen Reactions  . 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 Morphine And Related Hives and Itching    VITALS:  Blood pressure (!) 158/91, pulse 72, temperature 98.6 F (37 C), temperature source Oral, resp. rate 18, height 5\' 5"  (1.651 m), weight 63 kg (139 lb), SpO2 100 %.  PHYSICAL EXAMINATION:  Physical Exam   GENERAL:  72 y.o.-year-old patient lying in the bed, very restless. Thin cachectic EYES: Pupils equal, round, reactive to light and accommodation. No scleral icterus. Extraocular muscles intact.  HEENT: Head atraumatic, normocephalic. Oropharynx and nasopharynx clear.  NECK:  Supple, no jugular venous distention. No thyroid enlargement, no tenderness.  LUNGS: Normal breath sounds bilaterally, noted to have upper airway wheezing, no rales,rhonchi or crepitation.  Using accessory muscles of respiration.  Decreased bibasilar breath sounds.  Tachypneic. CARDIOVASCULAR: S1, S2 normal. No murmurs, rubs, or gallops.  ABDOMEN: Soft, diffuse tenderness all over, nondistended. Bowel sounds present. No organomegaly or mass.  EXTREMITIES: No pedal edema, cyanosis, or clubbing.  NEUROLOGIC:  Moving around in bed, alert and able to tell his name but confused.  PSYCHIATRIC: The patient is intermittently alert and hallucinating.  SKIN: No obvious rash, lesion, or ulcer.    LABORATORY PANEL:   CBC Recent Labs  Lab 03/27/18 0509  WBC 8.6  HGB 9.9*  HCT 30.0*  PLT 83*   ------------------------------------------------------------------------------------------------------------------  Chemistries  Recent Labs  Lab 03/25/18 0408  NA 142  K 3.6  CL  116*  CO2 21*  GLUCOSE 146*  BUN 22*  CREATININE 0.83  CALCIUM 8.1*  AST 18  ALT 17  ALKPHOS 68  BILITOT 2.5*   ------------------------------------------------------------------------------------------------------------------  Cardiac Enzymes No results for input(s): TROPONINI in the last 168 hours. ------------------------------------------------------------------------------------------------------------------  RADIOLOGY:  US Carotid Bilateral  Result Date: 03/28/2018 CLINICAL DATA:  CVA. EXAM: BILATERAL CAROTID DUPLEX ULTRASOUND TECHNIQUE: Wallace Cullens scale imaging, color Doppler and duplex ultrasound were performed of bilateral carotid and vertebral arteries in the neck. COMPARISON:  Brain MRI 03/23/2018 FINDINGS: Criteria: Quantification of carotid stenosis is based on velocity parameters that correlate the residual internal carotid diameter with NASCET-based stenosis levels, using the diameter of the distal internal carotid lumen as the denominator for stenosis measurement. The following velocity measurements were obtained: RIGHT ICA: 136 cm/sec CCA: 51 cm/sec SYSTOLIC ICA/CCA RATIO:  2.7 ECA:  48 cm/sec LEFT ICA: 121 cm/sec CCA: 94 cm/sec SYSTOLIC ICA/CCA RATIO:  1.3 ECA:  89 cm/sec RIGHT CAROTID ARTERY: Small amount of plaque in the right common carotid artery. Small amount of plaque at the right carotid bulb. Echogenic plaque near the origin of the external carotid artery. External carotid artery is patent with normal waveform. Echogenic plaque in the proximal internal carotid artery. Turbulent flow and plaque in the mid internal carotid artery with slightly elevated peak systolic velocity of 136 cm/sec. RIGHT VERTEBRAL ARTERY: Antegrade flow and normal waveform in the right vertebral artery. LEFT CAROTID ARTERY: Large amount of echogenic plaque in the left common carotid artery with posterior acoustic shadowing. No significant velocity elevations in the left common carotid artery. Echogenic  plaque at the left carotid  bulb. External carotid artery is patent with normal waveform. Small amount of plaque in the proximal internal carotid artery. Normal waveforms and velocities in the internal carotid artery. LEFT VERTEBRAL ARTERY: Antegrade flow and normal waveform in the left vertebral artery. 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 Estimated degree of stenosis in the right internal carotid artery is 50-69%. Estimated degree of stenosis in the left internal carotid artery is less than 50%. Patent vertebral arteries with antegrade flow. Electronically Signed   By: Richarda OverlieAdam  Henn M.D.   On: 03/28/2018 10:34    EKG:  No orders found for this or any previous visit.  ASSESSMENT AND PLAN:   72 year old male with past medical history significant for chronic pancreatitis status post abdominal surgery for the same, history of alcohol abuse presents to hospital secondary to nausea vomiting and abdominal pain  1.  Acute respiratory distress-with tachypnea and hypoxia -Respiratory rate in the 30s and sats 100% on 2 L now -CT chest with no acute findings, bibasilar atelectasis and mild pleural effusion noted. -Continue supplemental oxygen.   2.-Metabolic encephalopathy- initial concern for withdrawal vs early cognitive decline -could be secondary to acute CVA that was noted on MRI of the brain - per daughters hasn't been drinking alcohol in 2 years -CT of the head without any acute findings -now off precedex gtt - Ammonia is within normal limits.   -Neurology consult appreciated. -Currently on  aspirin.  3.  Hypertension-now on Coreg  -PRN hydralazine  4.  Sepsis-likely from UTI. Received  Rocephin. Urine culture negative. - Improving WBC  5.  DVT prophylaxis-history of prior DVT.  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  Thrombocytopenia-known history of liver disease.  Platelets dropped from 120 to 50.   Hold Xarelto.  Physical therapy to see pt-- recommends rehab  Palliative care consult for goals of care-- appreciated. Patient currently is full code.  Patient will go to rehab once family decides on bed acceptance to the offers that have been made. Discussed at length with patient's daughter Judeth CornfieldStephanie today  All the records are reviewed and case discussed with Care Management/Social Workerr. Management plans discussed with the patient, family and they are in agreement.  CODE STATUS: Full Code  TOTAL  TIME SPENT IN TAKING CARE OF THIS PATIENT: 30 minutes.   POSSIBLE D/C IN 1-2 DAYS, DEPENDING ON CLINICAL CONDITION.   Enedina FinnerSona Monigue Spraggins M.D on 03/28/2018 at 7:39 PM  Between 7am to 6pm - Pager - [phone] After 6pm go to [email or web address] - password [email or web address]  [facility]  Office  [phone]  CC: Primary care physician; Patient, [name] Per

## 2018-03-29 DIAGNOSIS — E440 Moderate protein-calorie malnutrition: Secondary | ICD-10-CM

## 2018-03-29 LAB — GLUCOSE, CAPILLARY
GLUCOSE-CAPILLARY: 109 mg/dL — AB (ref 65–99)
Glucose-Capillary: 65 mg/dL (ref 65–99)

## 2018-03-29 MED ORDER — AMLODIPINE BESYLATE 5 MG PO TABS
5.0000 mg | ORAL_TABLET | Freq: Every day | ORAL | Status: DC
  Administered 2018-03-29: 5 mg via ORAL
  Filled 2018-03-29: qty 1

## 2018-03-29 MED ORDER — FONDAPARINUX SODIUM 7.5 MG/0.6ML ~~LOC~~ SOLN: 7.5000 mg | Freq: Every day | SUBCUTANEOUS | 1 refills | Status: DC

## 2018-03-29 MED ORDER — CARVEDILOL 6.25 MG PO TABS: 6.2500 mg | ORAL_TABLET | Freq: Two times a day (BID) | ORAL | 1 refills | Status: DC

## 2018-03-29 MED ORDER — PANTOPRAZOLE SODIUM 40 MG PO TBEC: 40.0000 mg | DELAYED_RELEASE_TABLET | Freq: Every day | ORAL | 1 refills | Status: AC

## 2018-03-29 MED ORDER — ENSURE ENLIVE PO LIQD: 237.0000 mL | Freq: Two times a day (BID) | ORAL | 12 refills | Status: DC

## 2018-03-29 MED ORDER — ASPIRIN 325 MG PO TABS: 325.0000 mg | ORAL_TABLET | Freq: Every day | ORAL | 0 refills | Status: DC

## 2018-03-29 MED ORDER — AMLODIPINE BESYLATE 5 MG PO TABS: 5.0000 mg | ORAL_TABLET | Freq: Every day | ORAL | 1 refills | Status: DC

## 2018-03-29 MED ORDER — ADULT MULTIVITAMIN W/MINERALS CH: 1.0000 | ORAL_TABLET | Freq: Every day | ORAL | 1 refills | Status: AC

## 2018-03-29 NOTE — Clinical Social Work Note (Signed)
CSW called patient's daughter Joe [name] [phone], to give bed offers. Daughter states that she is not ready to make the decision yet and that she will be meeting with a facility later today to discuss options. Daughter states that she will be here later today and meet with CSW. CSW reminded daughter that patient is ready for discharge and we would need to make a decision. Daughter states that she will be here this afternoon with a decision. CSW will continue to follow for discharge planning.   Joe [name] MSW, [address]CSWA  [phone]

## 2018-03-29 NOTE — Discharge Summary (Addendum)
[facility] [place] [facility]   PATIENT NAME: Joe [name]    MR#:  [id number]  DATE OF BIRTH:  11/17/1945  DATE OF ADMISSION:  03/19/2018 ADMITTING PHYSICIAN: Houston Siren, MD  DATE OF DISCHARGE: 03/29/2018  PRIMARY CARE PHYSICIAN: Patient, [name] Per    ADMISSION DIAGNOSIS:  Acute pancreatitis, unspecified complication status, unspecified pancreatitis type [K85.90]  DISCHARGE DIAGNOSIS:  Acute respiratory distress-resolved Acute Metabolic encephalopathy improving Acute CVA Chronic pancreatitis with know h/o alcoholism DVT-- now on Arixtra Acute thrombocytopenia Delirium--improving SECONDARY DIAGNOSIS:   Past Medical History:  Diagnosis Date  . Pancreatitis   . Patient denies medical problems     HOSPITAL COURSE:   72 year old male with past medical history significant for chronic pancreatitis status post abdominal surgery for the same, history of alcohol abuse presents to hospital secondary to nausea vomiting and abdominal pain  1.  Acute respiratory distress-with tachypnea and hypoxia--resolved -sats 98% on RA -CT chest with no acute findings, bibasilar atelectasis and mild pleural effusion noted.  2.Acute Metabolic encephalopathy multifactoria- initial concern for withdrawal vs early cognitive decline -could be secondary to acute CVA that was noted on MRI of the brain - per daughters hasn't been drinking alcohol in 2 years -CT of the head without any acute findings - Ammonia is within normal limits.   -Neurology consult appreciated. -Currently on  aspirin.  3.  Hypertension-now on Coreg add amlodipine -PRN hydralazine  4.  UTI. Received  Rocephin. Urine culture negative. - Improving WBC -no evidence of sepsis  5.  DVT prophylaxis-history of prior DVT.  - xarelto held due to thrombocytopenia - repeat dopplers with non occlusive clot in right popliteal vein and also portal vein now on IV Agratroban---changed to Arixtra sq  7.5 mg qd per Dr Bethanne Ginger recommendation -oncolgy consult appreciated -HIT -- 0.228 (normal) -Continue teds and SCDs -plt improved to 83K  6.  Thrombocytopenia-known history of liver disease. Chronic PAncreatitis from h/o alcoholism -pt underwent Whipple's procedure in the past - Platelets dropped from 120 to 50--43--44--83K - Hold Xarelto. -cont Arixtra for now  Physical therapy to see pt-- recommends rehab  Palliative care consult for goals of care-- appreciated. Patient currently is full code.  Patient will go to rehab once family decides on bed acceptance to the offers that have been made. Family agreeable with Palliative f/u at rehab    CONSULTS OBTAINED:  Treatment Team:  Thana Farr, MD Pauletta Browns, MD Rickard Patience, MD  DRUG ALLERGIES:   Allergies  Allergen Reactions  . 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 Morphine And Related Hives and Itching    DISCHARGE MEDICATIONS:   Allergies as of 03/29/2018      Reactions   Heparin    HIT   Morphine And Related Hives, Itching      Medication List    STOP taking these medications   rivaroxaban 20 MG Tabs tablet Commonly known as:  XARELTO     TAKE these medications   amLODipine 5 MG tablet Commonly known as:  NORVASC Take 1 tablet (5 mg total) by mouth daily.   aspirin 325 MG tablet Take 1 tablet (325 mg total) by mouth daily.   carvedilol 6.25 MG tablet Commonly known as:  COREG Take 1 tablet (6.25 mg total) by mouth 2 (two) times daily with a meal. What changed:    medication strength  how much to take   CREON 36000 UNITS Cpep capsule Generic drug:  lipase/protease/amylase Take 72,000  Units by mouth 3 (three) times daily with meals.   feeding supplement (ENSURE ENLIVE) Liqd Take 237 mLs by mouth 2 (two) times daily between meals.   fondaparinux 7.5 MG/0.6ML Soln injection Commonly known as:  ARIXTRA Inject 0.6 mLs (7.5 mg total) into the skin daily.   mirtazapine 15 MG tablet Commonly known as:   REMERON Take 15 mg by mouth at bedtime.   multivitamin with minerals Tabs tablet Take 1 tablet by mouth daily.   pantoprazole 40 MG tablet Commonly known as:  PROTONIX Take 1 tablet (40 mg total) by mouth daily.       If you experience worsening of your admission symptoms, develop shortness of breath, life threatening emergency, suicidal or homicidal thoughts you must seek medical attention immediately by calling 911 or calling your MD immediately  if symptoms less severe.  You Must read complete instructions/literature along with all the possible adverse reactions/side effects for all the Medicines you take and that have been prescribed to you. Take any new Medicines after you have completely understood and accept all the possible adverse reactions/side effects.   Please note  You were cared for by a hospitalist during your hospital stay. If you have any questions about your discharge medications or the care you received while you were in the hospital after you are discharged, you can call the unit and asked to speak with the hospitalist on call if the hospitalist that took care of you is not available. Once you are discharged, your primary care physician will handle any further medical issues. Please note that NO REFILLS for any discharge medications will be authorized once you are discharged, as it is imperative that you return to your primary care physician (or establish a relationship with a primary care physician if you do not have one) for your aftercare needs so that they can reassess your need for medications and monitor your lab values. Today   SUBJECTIVE   No new complaints. Uneventful nite per RN  VITAL SIGNS:  Blood pressure (!) 155/75, pulse 68, temperature 98.3 F (36.8 C), temperature source Oral, resp. rate 18, height 5\' 5"  (1.651 m), weight 63 kg (139 lb), SpO2 98 %.  I/O:    Intake/Output Summary (Last 24 hours) at 03/29/2018 0739 Last data filed at 03/28/2018  1122 Gross per 24 hour  Intake 3 ml  Output -  Net 3 ml    PHYSICAL EXAMINATION:  GENERAL:  72 y.o.-year-old patient lying in the bed with no acute distress.  EYES: Pupils equal, round, reactive to light and accommodation. mild scleral icterus.  HEENT: Head atraumatic, normocephalic. Oropharynx and nasopharynx clear.  NECK:  Supple, no jugular venous distention. No thyroid enlargement, no tenderness.  LUNGS: Normal breath sounds bilaterally, no wheezing, rales,rhonchi or crepitation. No use of accessory muscles of respiration.  CARDIOVASCULAR: S1, S2 normal. No murmurs, rubs, or gallops.  ABDOMEN: Soft, non-tender, non-distended. Bowel sounds present. No organomegaly or mass.  EXTREMITIES: No pedal edema, cyanosis, or clubbing.  NEUROLOGIC: grossly nonfocal PSYCHIATRIC: The patient is alert. Pleasantly confused  SKIN: No obvious rash, lesion, or ulcer.   DATA REVIEW:   CBC  Recent Labs  Lab 03/27/18 0509  WBC 8.6  HGB 9.9*  HCT 30.0*  PLT 83*    Chemistries  Recent Labs  Lab 03/25/18 0408  NA 142  K 3.6  CL 116*  CO2 21*  GLUCOSE 146*  BUN 22*  CREATININE 0.83  CALCIUM 8.1*  AST 18  ALT 17  ALKPHOS 68  BILITOT 2.5*    Microbiology Results   Recent Results (from the past 240 hour(s))  CULTURE, BLOOD (ROUTINE X 2) w Reflex to ID Panel     Status: None   Collection Time: 03/21/18 12:26 PM  Result Value Ref Range Status   Specimen Description BLOOD RIGHT MID LINE  Final   Special Requests   Final    BOTTLES DRAWN AEROBIC AND ANAEROBIC Blood Culture adequate volume   Culture   Final    NO GROWTH 5 DAYS Performed at [facility], [address] [place], [place] [address]    Report Status 03/26/2018 FINAL  Final  CULTURE, BLOOD (ROUTINE X 2) w Reflex to ID Panel     Status: None   Collection Time: 03/21/18 12:29 PM  Result Value Ref Range Status   Specimen Description BLOOD LEFT ANTECUBITAL  Final   Special Requests   Final    BOTTLES DRAWN  AEROBIC AND ANAEROBIC Blood Culture adequate volume   Culture   Final    NO GROWTH 5 DAYS Performed at [facility], [address] [place], [place] [address]    Report Status 03/26/2018 FINAL  Final  Urine Culture     Status: None   Collection Time: 03/21/18 12:46 PM  Result Value Ref Range Status   Specimen Description   Final    URINE, RANDOM Performed at [facility], [address], [place], [place] [address]    Special Requests   Final    NONE Performed at [facility], [address], [place], [place] [address]    Culture   Final    NO GROWTH Performed at [facility], [address], [place], [place] [address]    Report Status 03/22/2018 FINAL  Final  C difficile quick scan w PCR reflex     Status: None   Collection Time: 03/23/18  7:36 AM  Result Value Ref Range Status   C Diff antigen NEGATIVE NEGATIVE Final   C Diff toxin NEGATIVE NEGATIVE Final   C Diff interpretation No C. difficile detected.  Final    Comment: Performed at [facility], [address] [place], [place] [address]  MRSA PCR Screening     Status: Abnormal   Collection Time: 03/23/18  5:46 PM  Result Value Ref Range Status   MRSA by PCR POSITIVE (A) NEGATIVE Final    Comment:        The GeneXpert MRSA Assay (FDA approved for NASAL specimens only), is one component of a comprehensive MRSA colonization surveillance program. It is not intended to diagnose MRSA infection nor to guide or monitor treatment for MRSA infections. RESULT CALLED TO, READ BACK BY AND VERIFIED WITH: BARBARA THAO ON 03/23/18 AT 1942 JAG Performed at [facility], [address] [place], [place] [address]   C difficile quick scan w PCR reflex     Status: None   Collection Time: 03/25/18  7:25 PM  Result Value Ref Range Status   C Diff antigen NEGATIVE NEGATIVE Final   C Diff toxin NEGATIVE NEGATIVE Final   C Diff interpretation No C. difficile detected.  Final     Comment: Performed at [facility], [address] [place], [place] [address]    RADIOLOGY:  US Carotid Bilateral  Result Date: 03/28/2018 CLINICAL DATA:  CVA. EXAM: BILATERAL CAROTID DUPLEX ULTRASOUND TECHNIQUE: Wallace Cullens scale imaging, color Doppler and duplex ultrasound were performed of bilateral carotid and vertebral arteries  in the neck. COMPARISON:  Brain MRI 03/23/2018 FINDINGS: Criteria: Quantification of carotid stenosis is based on velocity parameters that correlate the residual internal carotid diameter with NASCET-based stenosis levels, using the diameter of the distal internal carotid lumen as the denominator for stenosis measurement. The following velocity measurements were obtained: RIGHT ICA: 136 cm/sec CCA: 51 cm/sec SYSTOLIC ICA/CCA RATIO:  2.7 ECA:  48 cm/sec LEFT ICA: 121 cm/sec CCA: 94 cm/sec SYSTOLIC ICA/CCA RATIO:  1.3 ECA:  89 cm/sec RIGHT CAROTID ARTERY: Small amount of plaque in the right common carotid artery. Small amount of plaque at the right carotid bulb. Echogenic plaque near the origin of the external carotid artery. External carotid artery is patent with normal waveform. Echogenic plaque in the proximal internal carotid artery. Turbulent flow and plaque in the mid internal carotid artery with slightly elevated peak systolic velocity of 136 cm/sec. RIGHT VERTEBRAL ARTERY: Antegrade flow and normal waveform in the right vertebral artery. LEFT CAROTID ARTERY: Large amount of echogenic plaque in the left common carotid artery with posterior acoustic shadowing. No significant velocity elevations in the left common carotid artery. Echogenic plaque at the left carotid bulb. External carotid artery is patent with normal waveform. Small amount of plaque in the proximal internal carotid artery. Normal waveforms and velocities in the internal carotid artery. LEFT VERTEBRAL ARTERY: Antegrade flow and normal waveform in the left vertebral artery. 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 Estimated degree of stenosis in the right internal carotid artery is 50-69%. Estimated degree of stenosis in the left internal carotid artery is less than 50%. Patent vertebral arteries with antegrade flow. Electronically Signed   By: Richarda OverlieAdam  Henn M.D.   On: 03/28/2018 10:34     Management plans discussed with the patient, family and they are in agreement.  CODE STATUS:     Code Status Orders  (From admission, onward)        Start     Ordered   03/19/18 1829  Full code  Continuous     03/19/18 1828    Code Status History    This patient has a current code status but no historical code status.    Advance Directive Documentation     Most Recent Value  Type of Advance Directive  Healthcare Power of Attorney  Pre-existing out of facility DNR order (yellow form or pink MOST form)  -  "MOST" Form in Place?  -      TOTAL TIME TAKING CARE OF THIS PATIENT: *40* minutes.    Enedina FinnerSona Pieper Kasik M.D on 03/29/2018 at 7:39 AM  Between 7am to 6pm - Pager - [phone] After 6pm go to [email or web address] - password [email or web address]  [facility]  Office  [phone]  CC: Primary care physician; Patient, [name] Per

## 2018-03-29 NOTE — Care Management Important Message (Signed)
Important Message  Patient Details  Name: Elray McgregorWalter Gagen MRN: [id number] Date of Birth: 04/29/1946   Medicare Important Message Given:  Yes    Marily MemosLisa M Deklyn Trachtenberg, RN 03/29/2018, 1:55 PM

## 2018-03-29 NOTE — Progress Notes (Signed)
Report called to [facility] at [phone].  Report provided to Mrs. Bokien. No question at close of report.  EMS called for non emergent transport. Report provided to EMS transport. Daughter at bedside notified.

## 2018-03-29 NOTE — Care Management Obs Status (Deleted)
MEDICARE OBSERVATION STATUS NOTIFICATION   Patient Details  Name: Joe [name] MRN: [id number] Date of Birth: 02/24/1946   Medicare Observation Status Notification Given:  Yes    Marily MemosLisa M Dorice Stiggers, RN 03/29/2018, 11:44 AM

## 2018-03-29 NOTE — Progress Notes (Signed)
Daily Progress Note   Patient Name: Joe McgregorWalter [name]       Date: 03/29/2018 DOB: 01/04/1946  Age: 72 y.o. MRN#: [id number] Attending Physician: Enedina FinnerPatel, Sona, MD Primary Care Physician: Patient, [name] Per Admit Date: 03/19/2018  Reason for Consultation/Follow-up: Establishing goals of care  Subjective: Patient lying in bed. He is alert and oriented to self. Not so much location. He denies any pain or discomfort.  Per nursing staff he is doing fairly well with his meals with some pocketing noted. Daughter at bedside. She verbalized that her and the family has chosen [facility] rehab facility. She continues to worry that patient's new baseline may be some confusion and intermittent episodes of incontinence. She is hopeful that he will continue to show some improvement once discharge. Daughter verbalizes they are now in agreement with outpatient palliative service.   Chart Reviewed and report received from bedside RN.   Length of Stay: 10  Current Medications: Scheduled Meds:  . amLODipine  5 mg Oral Daily  . aspirin  325 mg Oral Daily  . carvedilol  6.25 mg Oral BID WC  . feeding supplement (ENSURE ENLIVE)  237 mL Oral BID BM  . fondaparinux (ARIXTRA) injection  7.5 mg Subcutaneous Daily  . lipase/protease/amylase  72,000 Units Oral TID WC  . mirtazapine  15 mg Oral QHS  . multivitamin with minerals  1 tablet Oral Daily  . pantoprazole  40 mg Oral BID AC  . predniSONE  50 mg Oral Q breakfast  . sodium chloride flush  10-40 mL Intracatheter Q12H  . sodium chloride flush  10-40 mL Intracatheter Q12H  . sodium chloride flush  3 mL Intravenous Q12H    Continuous Infusions: . sodium chloride      PRN Meds: sodium chloride, acetaminophen **OR** acetaminophen, albuterol, hydrALAZINE, iohexol,  iopamidol, labetalol, ondansetron **OR** ondansetron (ZOFRAN) IV, sodium chloride flush, sodium chloride flush, sodium chloride flush  Physical Exam  Constitutional: Vital signs are normal. He is more awake and alert today. He has a sickly appearance. Oriented x2.  Cardiovascular: Normal heart sounds, intact distal pulses and normal pulses.  Pulmonary/Chest: Effort normal. He has decreased breath sounds.  Abdominal: Soft. Bowel sounds are normal.  Musculoskeletal:  Generalized weakness   Neurological: He is disoriented.   Skin: Skin is warm and dry.  Psychiatric: Cognition  and memory are impaired. He expresses inappropriate judgment.  Nursing note and vitals reviewed.  Vital Signs: BP (!) 180/73 (BP Location: Right Wrist)   Pulse 61   Temp 98.5 F (36.9 C) (Oral)   Resp 18   Ht 5\' 5"  (1.651 m)   Wt 63 kg (139 lb) Comment: bed scale (low bed)  SpO2 99%   BMI 23.13 kg/m  SpO2: SpO2: 99 % O2 Device: O2 Device: Room Air O2 Flow Rate: O2 Flow Rate (L/min): 2 L/min  Intake/output summary:   Intake/Output Summary (Last 24 hours) at 03/29/2018 1130 Last data filed at 03/29/2018 1610 Gross per 24 hour  Intake 13 ml  Output -  Net 13 ml   LBM: Last BM Date: 03/29/18 Baseline Weight: Weight: 59 kg (130 lb) Most recent weight: Weight: 63 kg (139 lb)(bed scale (low bed))       Palliative Assessment/Data:PPS 30%   Flowsheet Rows     Most Recent Value  Intake Tab  Referral Department  Hospitalist  Unit at Time of Referral  Med/Surg Unit  Palliative Care Primary Diagnosis  Cardiac  Date Notified  03/23/18  Palliative Care Type  New Palliative care  Reason for referral  Clarify Goals of Care  Date of Admission  03/19/18  Date first seen by Palliative Care  03/24/18  # of days Palliative referral response time  1 Day(s)  # of days IP prior to Palliative referral  4  Clinical Assessment  Psychosocial & Spiritual Assessment  Palliative Care Outcomes      Patient Active  Problem List   Diagnosis Date Noted  . Malnutrition of moderate degree 03/28/2018  . Acute pancreatitis   . Thrombocytopenia (HCC)   . Acute deep vein thrombosis (DVT) of proximal vein of right lower extremity (HCC)   . Acute on chronic pancreatitis (HCC) 03/19/2018    Palliative Care Assessment & Plan   Patient Profile: 72 y.o. male admitted on 03/19/2018 from home with nausea, vomiting, and abdominal pain. He has a past medical history significant for chronic pancreatitis, s/p whipple surgery, essential hypertension, and DVT. On presentation to ER patient is somewhat confused and therefore most of the history was obtained from the daughter at the bedside. Patient resides with his brother who noted that the patient was having multiple episodes of nausea and vomiting prior to arrival. He then became increasingly weak and could not get out of bed and therefore he called EMS. In the ER patient continued to complain of generalized abdominal pain but no episodes of nausea or vomiting. Patient's blood work was consistent with mild acute pancreatitis. His CT scan of the abdomen pelvis showed postsurgical changes from his previous Whipple procedure but no evidence of acute pancreatitis. Patient denied any fever, cough, congestion, melena, hematochezia, or any other associated symptoms presently. 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 Palliative Medicine team consulted for goals of care discussion.   Recommendations/Plan:  FULL CODE at family's request.  [name] states that family has decided to continue with this decision despite knowing that her father has told him in the past he would not want to be a full code.  Family however feels that they have saw some improvement in his status over the past week and are hopeful that he will continue to improve.  Continue to treat the treatable with watchful  waiting. Family hopeful he will show some signs of mental and physical recovery.  Case Management consult for outpatient palliative services at discharge at daughter's request.   Daughter has chosen [facility] facility for rehabilitation care.  Palliative Medicine team will continue to support patient, family, and medical team during hospitalization.   Goals of Care and Additional Recommendations:  Limitations on Scope of Treatment: Full Scope Treatment  Code Status:    Code Status Orders  (From admission, onward)        Start     Ordered   03/19/18 1829  Full code  Continuous     03/19/18 1828    Code Status History    This patient has a current code status but no historical code status.    Advance Directive Documentation     Most Recent Value  Type of Advance Directive  Healthcare Power of Attorney  Pre-existing out of facility DNR order (yellow form or pink MOST form)  -  "MOST" Form in Place?  -       Prognosis:   Unable to determine guarded to poor in the setting of hypertension, altered mental status, decreased po intake, deconditioning, acute CVA, chronic pancreatitis  Discharge Planning:  Skilled Nursing Facility for rehab with Palliative care service follow-up   Care plan was discussed with patient's family, Dr. Allena Katz, and bedside RN.   Thank you for allowing the Palliative Medicine Team to assist in the care of this patient.   Total Time 35 min Prolonged Time Billed  No      Greater than 50%  of this time was spent counseling and coordinating care related to the above assessment and plan.  Willette Alma, NP-BC Palliative Medicine Team  Phone: [phone] Fax: [phone]   Please contact Palliative Medicine Team phone at [phone] for questions and concerns.

## 2018-03-29 NOTE — Progress Notes (Signed)
New referral for outpatient Palliative to follow at [facility] received from CSW Ruthe Mannanandace Garrison following a Palliative medicine consult. Plan is for discharge today. Patient information faxed to referral.  Dayna BarkerKaren Robertson RN, BSN, [facility], hospital liaison [phone]

## 2018-03-29 NOTE — Discharge Instructions (Signed)
Pt will f/u PCP after d/c from rehab

## 2018-03-29 NOTE — Clinical Social Work Note (Signed)
CSW spoke with patient's daughter Ilean SkillStephanie Davis, [phone]. Daughter has decided on [facility]. CSW notified daughter that patient is ready for discharge today. CSW also notified Jomarie LongsJoseph, [facility] at [facility] that [facility] and that patient will discharge today. Patient will be transported via EMS and RN will call report.   Ruthe Mannanandace Jashley Yellin MSW, [address]CSWA [phone]

## 2018-05-14 DIAGNOSIS — R4182 Altered mental status, unspecified: Secondary | ICD-10-CM | POA: Insufficient documentation

## 2018-07-22 ENCOUNTER — Other Ambulatory Visit
Admission: RE | Admit: 2018-07-22 | Discharge: 2018-07-22 | Disposition: A | Discharge status: Home / Self Care | Payer: Medicare Other | Source: Ambulatory Visit | Attending: Family Medicine | Admitting: Family Medicine

## 2018-07-22 DIAGNOSIS — K709 Alcoholic liver disease, unspecified: Secondary | ICD-10-CM | POA: Diagnosis present

## 2018-07-22 LAB — AMMONIA: Ammonia: 81 umol/L — ABNORMAL HIGH (ref 9–35)

## 2018-09-03 ENCOUNTER — Other Ambulatory Visit
Admission: RE | Admit: 2018-09-03 | Discharge: 2018-09-03 | Disposition: A | Discharge status: Home / Self Care | Payer: Medicare Other | Source: Ambulatory Visit | Attending: Family Medicine | Admitting: Family Medicine

## 2018-09-03 DIAGNOSIS — K709 Alcoholic liver disease, unspecified: Secondary | ICD-10-CM | POA: Diagnosis present

## 2018-09-03 LAB — AMMONIA: Ammonia: 150 umol/L — ABNORMAL HIGH (ref 9–35)

## 2018-12-20 ENCOUNTER — Other Ambulatory Visit: Payer: Self-pay

## 2018-12-20 ENCOUNTER — Encounter: Payer: Self-pay | Admitting: Emergency Medicine

## 2018-12-20 ENCOUNTER — Emergency Department
Admission: EM | Admit: 2018-12-20 | Discharge: 2018-12-20 | Disposition: A | Discharge status: Home / Self Care | Payer: Medicare Other | Attending: Emergency Medicine | Admitting: Emergency Medicine

## 2018-12-20 ENCOUNTER — Emergency Department: Payer: Medicare Other

## 2018-12-20 DIAGNOSIS — N3000 Acute cystitis without hematuria: Secondary | ICD-10-CM | POA: Insufficient documentation

## 2018-12-20 DIAGNOSIS — F17210 Nicotine dependence, cigarettes, uncomplicated: Secondary | ICD-10-CM | POA: Diagnosis not present

## 2018-12-20 DIAGNOSIS — R109 Unspecified abdominal pain: Secondary | ICD-10-CM | POA: Diagnosis present

## 2018-12-20 DIAGNOSIS — K838 Other specified diseases of biliary tract: Secondary | ICD-10-CM | POA: Diagnosis not present

## 2018-12-20 LAB — CBC
HCT: 40.8 % (ref 39.0–52.0)
Hemoglobin: 13.9 g/dL (ref 13.0–17.0)
MCH: 29 pg (ref 26.0–34.0)
MCHC: 34.1 g/dL (ref 30.0–36.0)
MCV: 85.2 fL (ref 80.0–100.0)
NRBC: 0 % (ref 0.0–0.2)
PLATELETS: 148 10*3/uL — AB (ref 150–400)
RBC: 4.79 MIL/uL (ref 4.22–5.81)
RDW: 14.3 % (ref 11.5–15.5)
WBC: 6.9 10*3/uL (ref 4.0–10.5)

## 2018-12-20 LAB — URINALYSIS, COMPLETE (UACMP) WITH MICROSCOPIC
GLUCOSE, UA: NEGATIVE mg/dL
Ketones, ur: NEGATIVE mg/dL
NITRITE: NEGATIVE
Protein, ur: NEGATIVE mg/dL
pH: 5 (ref 5.0–8.0)

## 2018-12-20 LAB — COMPREHENSIVE METABOLIC PANEL
ALK PHOS: 264 U/L — AB (ref 38–126)
ALT: 43 U/L (ref 0–44)
ANION GAP: 11 (ref 5–15)
AST: 56 U/L — AB (ref 15–41)
Albumin: 3.1 g/dL — ABNORMAL LOW (ref 3.5–5.0)
BILIRUBIN TOTAL: 14.5 mg/dL — AB (ref 0.3–1.2)
BUN: 10 mg/dL (ref 8–23)
CO2: 21 mmol/L — ABNORMAL LOW (ref 22–32)
Calcium: 8.9 mg/dL (ref 8.9–10.3)
Chloride: 104 mmol/L (ref 98–111)
Creatinine, Ser: <0.3 mg/dL — ABNORMAL LOW (ref 0.61–1.24)
Glucose, Bld: 102 mg/dL — ABNORMAL HIGH (ref 70–99)
Potassium: 4 mmol/L (ref 3.5–5.1)
Sodium: 136 mmol/L (ref 135–145)
TOTAL PROTEIN: 7.2 g/dL (ref 6.5–8.1)

## 2018-12-20 LAB — LIPASE, BLOOD: LIPASE: 17 U/L (ref 11–51)

## 2018-12-20 MED ORDER — LEVOFLOXACIN 750 MG PO TABS: 750.0000 mg | ORAL_TABLET | Freq: Every day | ORAL | 0 refills | Status: AC

## 2018-12-20 MED ORDER — IOHEXOL 300 MG/ML  SOLN
100.0000 mL | Freq: Once | INTRAMUSCULAR | Status: AC | PRN
  Administered 2018-12-20: 100 mL via INTRAVENOUS

## 2018-12-20 MED ORDER — SODIUM CHLORIDE 0.9 % IV SOLN
1.0000 g | Freq: Once | INTRAVENOUS | Status: AC
  Administered 2018-12-20: 1 g via INTRAVENOUS
  Filled 2018-12-20: qty 10

## 2018-12-20 MED ORDER — FENTANYL CITRATE (PF) 100 MCG/2ML IJ SOLN
25.0000 ug | Freq: Once | INTRAMUSCULAR | Status: AC
  Administered 2018-12-20: 25 ug via INTRAVENOUS
  Filled 2018-12-20: qty 2

## 2018-12-20 MED ORDER — OXYCODONE HCL 5 MG PO TABS: 5.0000 mg | ORAL_TABLET | Freq: Four times a day (QID) | ORAL | 0 refills | Status: DC | PRN

## 2018-12-20 MED ORDER — SODIUM CHLORIDE 0.9 % IV BOLUS
1000.0000 mL | Freq: Once | INTRAVENOUS | Status: AC
  Administered 2018-12-20: 1000 mL via INTRAVENOUS

## 2018-12-20 MED ORDER — ONDANSETRON HCL 4 MG/2ML IJ SOLN
4.0000 mg | Freq: Once | INTRAMUSCULAR | Status: AC
  Administered 2018-12-20: 4 mg via INTRAVENOUS
  Filled 2018-12-20: qty 2

## 2018-12-20 MED ORDER — FENTANYL CITRATE (PF) 100 MCG/2ML IJ SOLN
INTRAMUSCULAR | Status: AC
  Filled 2018-12-20: qty 2

## 2018-12-20 MED ORDER — FENTANYL CITRATE (PF) 100 MCG/2ML IJ SOLN
50.0000 ug | Freq: Once | INTRAMUSCULAR | Status: AC
  Administered 2018-12-20: 50 ug via INTRAVENOUS

## 2018-12-20 MED ORDER — LEVOFLOXACIN 750 MG PO TABS: 750.0000 mg | ORAL_TABLET | Freq: Every day | ORAL | 0 refills | Status: DC

## 2018-12-20 NOTE — ED Provider Notes (Addendum)
[facility] Emergency Department Provider Note  ____________________________________________  Time seen: Approximately 8:45 AM  I have reviewed the triage vital signs and the nursing notes.   HISTORY  Chief Complaint Abnormal Lab    HPI Joe [name] is a 73 y.o. male that is post remote Whipple, history of pancreatitis, presenting for elevated bilirubin.  The patient underwent laboratory testing which shows a bilirubin of 10.1 and other grossly elevated LFTs.  The patient reports that he has been having diffuse nonfocal abdominal pain for the past 3 days.  He is a poor historian. he is noted to have mild diffuse jaundice and scleral icterus.  He reports nausea without vomiting. He does not know if he has tried anything for his pain.  No diarrhea or constipation.  Past Medical History:  Diagnosis Date  . Pancreatitis   . Patient denies medical problems     Patient Active Problem List   Diagnosis Date Noted  . Malnutrition of moderate degree 03/28/2018  . Acute pancreatitis   . Thrombocytopenia (HCC)   . Acute deep vein thrombosis (DVT) of proximal vein of right lower extremity (HCC)   . 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 WHIPPLE PROCEDURE      Current Outpatient Rx  . Order #: [id number] Class: Normal  . Order #: [id number] Class: Normal  . Order #: [id number] Class: Normal  . Order #: [id number] Class: Normal  . Order #: [id number] Class: Normal  . Order #: [id number] Class: Historical Med  . Order #: [id number] Class: Historical Med  . Order #: [id number] Class: Print  . Order #: [id number] Class: Normal    Allergies Heparin and Morphine and related  Family History  Problem Relation Age of Onset  . Brain cancer Mother   . Heart disease Brother     Social History Social History   Tobacco Use  . Smoking status: Current Every Day Smoker    Packs/day: 0.50    Years: 50.00    Pack years:  25.00    Types: Cigarettes  . Smokeless tobacco: Current User  Substance Use Topics  . Alcohol use: Not Currently  . Drug use: Not Currently    Review of Systems Demented due to patient being a poor historian. Constitutional: No fever/chills.  No lightheadedness or syncope Eyes: No visual changes.  As of scleral icterus. ENT: No congestion or rhinorrhea. Cardiovascular: Denies chest pain. Denies palpitations. Respiratory: Denies shortness of breath.  No cough. Gastrointestinal: Diffuse nonfocal abdominal pain.  Positive nausea, no vomiting.  No diarrhea.  No constipation. Genitourinary: Negative for dysuria. Musculoskeletal: Negative for back pain. Skin: Negative for rash. Neurological: Negative for headaches. No focal numbness, tingling or weakness.     ____________________________________________   PHYSICAL EXAM:  VITAL SIGNS: ED Triage Vitals  Enc Vitals Group     BP --      Pulse Rate 12/20/18 0843 74     Resp 12/20/18 0843 18     Temp 12/20/18 0843 98.5 F (36.9 C)     Temp src --      SpO2 12/20/18 0843 100 %     Weight 12/20/18 0844 175 lb (79.4 kg)     Height 12/20/18 0844 5\' 11"  (1.803 m)     Head Circumference --      [facility] Flow --      Pain Score --      Pain Loc --      Pain Edu? --  Excl. in GC? --     Constitutional: Alert and oriented.  Answers questions appropriately.  Chronically ill-appearing. Eyes: Conjunctivae are normal.  EOMI. positive scleral icterus. Head: Atraumatic. Nose: No congestion/rhinnorhea. Mouth/Throat: Mucous membranes are moist.  Neck: No stridor.  Supple.   Cardiovascular: Normal rate, regular rhythm. No murmurs, rubs or gallops.  Respiratory: Normal respiratory effort.  No accessory muscle use or retractions. Lungs CTAB.  No wheezes, rales or ronchi. Gastrointestinal: Soft, nontender and mildly distended.  I am unable to reproduce any tenderness on my examination.  Negative Murphy sign..  No guarding or rebound.  No  peritoneal signs. Musculoskeletal: No LE edema.  Neurologic:  A&Ox3.  Speech is clear.  Face and smile are symmetric.  EOMI.  Moves all extremities well. Skin:  Skin is warm, dry and intact. No rash noted.  Positive jaundice. Psychiatric: Mood and affect are normal.   ____________________________________________   LABS (all labs ordered are listed, but only abnormal results are displayed)  Labs Reviewed  CBC - Abnormal; Notable for the following components:      Result Value   Platelets 148 (*)    All other components within normal limits  COMPREHENSIVE METABOLIC PANEL - Abnormal; Notable for the following components:   CO2 21 (*)    Glucose, Bld 102 (*)    Creatinine, Ser <0.30 (*)    Albumin 3.1 (*)    AST 56 (*)    Alkaline Phosphatase 264 (*)    Total Bilirubin 14.5 (*)    All other components within normal limits  URINALYSIS, COMPLETE (UACMP) WITH MICROSCOPIC - Abnormal; Notable for the following components:   Color, Urine AMBER (*)    APPearance HAZY (*)    Specific Gravity, Urine >1.046 (*)    Hgb urine dipstick SMALL (*)    Bilirubin Urine MODERATE (*)    Leukocytes,Ua MODERATE (*)    Bacteria, UA RARE (*)    All other components within normal limits  URINE CULTURE  LIPASE, BLOOD   ____________________________________________  EKG  Not indicated ____________________________________________  RADIOLOGY  Ct Abdomen Pelvis W Contrast  Result Date: 12/20/2018 CLINICAL DATA:  Nausea, vomiting and abdominal pain. History of Whipple procedure. EXAM: CT ABDOMEN AND PELVIS WITH CONTRAST TECHNIQUE: Multidetector CT imaging of the abdomen and pelvis was performed using the standard protocol following bolus administration of intravenous contrast. CONTRAST:  100 mL OMNIPAQUE IOHEXOL 300 MG/ML  SOLN COMPARISON:  CT abdomen and pelvis 0 6/0 10/2017. FINDINGS: Lower chest: Lung bases are emphysematous but clear. Hepatobiliary: Two gastrohepatic biliary drainage catheters remain  in place and are unchanged in position. One catheter extends from the common bile duct into the stomach and the second extends from the proximal small bowel into the stomach. Intrahepatic biliary ductal dilatation appears worse than on the prior exam. No focal liver lesion is identified. Pancreas: Multiple punctate calcifications in the pancreas and mild dilatation of the pancreatic duct in the distal body and head are unchanged. No focal lesion is identified. Spleen: No focal lesion. The spleen measures approximately 13 cm craniocaudal, unchanged. Adrenals/Urinary Tract: Adrenal glands are unremarkable. Kidneys are normal, without renal calculi, focal lesion, or hydronephrosis. Bladder is unremarkable. Stomach/Bowel: The patient is status post right hemicolectomy. Diverticulosis without diverticulitis is most notable in the sigmoid. Postoperative change of distal gastrectomy is also identified. No evidence of bowel obstruction or inflammatory change. Vascular/Lymphatic: Aortic atherosclerosis. No enlarged abdominal or pelvic lymph nodes. Reproductive: Prostate is unremarkable. Other: None. 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 Scattered degenerative disc disease is worst at L2-3 and L3-4 but unchanged in appearance. Convex right scoliosis noted. IMPRESSION: Two gastrohepatic biliary drainage catheters are unchanged in position. Intrahepatic biliary ductal dilatation is worse than on the prior exam. Status post right hemicolectomy and partial gastrectomy. Atherosclerosis. Diverticulosis without diverticulitis. Changes of chronic pancreatitis. Emphysema. Spondylosis. Chronic bilateral L5 pars interarticularis defects cause unchanged 0.5 cm anterolisthesis L5 on S1. Electronically Signed   By: Drusilla Kanner M.D.   On: 12/20/2018 11:09    ____________________________________________   PROCEDURES  Procedure(s) performed:  None  Procedures  Critical Care performed: No ____________________________________________   INITIAL IMPRESSION / ASSESSMENT AND PLAN / ED COURSE  Pertinent labs & imaging results that were available during my care of the patient were reviewed by me and considered in my medical decision making (see chart for details).  73 y.o. male brought in from his nursing facility for bilirubin of 10.1 and other LFT abnormalities with 3 days of abdominal pain and obvious jaundice and scleral icterus on examination.  I will plan a CT of the abdomen with laboratory studies; the patient will be treated for pain nausea and be given fluids.  Plan reevaluation but I anticipate admission.  ----------------------------------------- 11:33 AM on 12/20/2018 -----------------------------------------  The patient's bili is 14.5 here.  In addition, his CT abd shows stents which are in place, but increased intrahepatic ductal dilatation.  I reviewed the patient's chart, and has last stent replacement was in 11/18 by Dr. Raylene Miyamoto at [facility].  I called [facility] for transfer.  The patient's daughter is now present and corroborates his last stent intervention was in 2018.  He did have plans for stent exchange in February but was unable to have it due to miscommunication about stopping his Xarelto.  His next outpatient appt is not until May.  The patient does have a UTI and will be treated with Rocephin; a culture has been sent.  ----------------------------------------- 1:54 PM on 12/20/2018 -----------------------------------------  Have been called back by Dr. Veverly Fells, the hospitalist at [facility].  [facility] hospital is full and there are no available beds.  There is also no room in the GI schedule to do his stent exchange today.  However, the patient can have his procedure done at 8 AM tomorrow.  Here, the patient is stable for discharge home.  He has been instructed to remain n.p.o. after midnight, and hold his Xarelto.  In  addition, he will start Levaquin tomorrow morning.  He has an appointment to be seen in the GI clinic at 8 AM, and I have talked to his daughter extensively about the following instructions, as well as the possibility that he may be admitted to the observation unit as needed after his procedure.  At this time, his daughter feels comfortable with the plan and the patient will go back to [facility] for the night with close follow-up for stent exchange tomorrow morning at 8 AM.  ____________________________________________  FINAL CLINICAL IMPRESSION(S) / ED DIAGNOSES  Final diagnoses:  Hyperbilirubinemia  Intrahepatic bile duct dilation  Acute cystitis without hematuria         NEW MEDICATIONS STARTED DURING THIS VISIT:  New Prescriptions   No medications on file      Rockne Menghini, MD 12/20/18 1136    Rockne Menghini, MD 12/20/18 1356

## 2018-12-20 NOTE — ED Notes (Signed)
Family signs consent for transfer

## 2018-12-20 NOTE — Discharge Instructions (Addendum)
Please hold your Xarelto tonight.  Do not eat after midnight.  Please take the first dose of Levaquin tomorrow morning.  Please go to the GI office at 8 AM tomorrow morning to have your stents were placed.  Please bring what you would need for an overnight stay, if they decide to watch you at the hospital overnight.

## 2018-12-20 NOTE — ED Triage Notes (Signed)
Pt arrives via ems from [facility] with concerns over abnormal lab. EMS and pt unaware of which value. Per paperwork pt TBIL (R) result was 10.1. Pt also reports chronic abdominal pain stating; "it hurts like it always does."

## 2018-12-20 NOTE — ED Notes (Signed)
[facility]  TRANSFER  CENTER  CALL  PER  DR  Sharma Covert

## 2018-12-20 NOTE — ED Notes (Signed)
IV team at bedside 

## 2018-12-20 NOTE — ED Notes (Signed)
Patient transported to CT 

## 2018-12-22 LAB — URINE CULTURE

## 2019-10-24 ENCOUNTER — Encounter: Payer: Self-pay | Admitting: *Deleted

## 2019-10-24 ENCOUNTER — Emergency Department
Admission: EM | Admit: 2019-10-24 | Discharge: 2019-10-25 | Disposition: A | Discharge status: Skilled Nursing Facility | Payer: Medicare Other | Attending: Student | Admitting: Student

## 2019-10-24 ENCOUNTER — Other Ambulatory Visit: Payer: Self-pay

## 2019-10-24 DIAGNOSIS — R7989 Other specified abnormal findings of blood chemistry: Secondary | ICD-10-CM | POA: Diagnosis not present

## 2019-10-24 DIAGNOSIS — K861 Other chronic pancreatitis: Secondary | ICD-10-CM | POA: Diagnosis not present

## 2019-10-24 DIAGNOSIS — Z7982 Long term (current) use of aspirin: Secondary | ICD-10-CM | POA: Diagnosis not present

## 2019-10-24 DIAGNOSIS — R1013 Epigastric pain: Secondary | ICD-10-CM | POA: Diagnosis not present

## 2019-10-24 DIAGNOSIS — Z79899 Other long term (current) drug therapy: Secondary | ICD-10-CM | POA: Diagnosis not present

## 2019-10-24 DIAGNOSIS — F17210 Nicotine dependence, cigarettes, uncomplicated: Secondary | ICD-10-CM | POA: Insufficient documentation

## 2019-10-24 DIAGNOSIS — R17 Unspecified jaundice: Secondary | ICD-10-CM | POA: Insufficient documentation

## 2019-10-24 DIAGNOSIS — R109 Unspecified abdominal pain: Secondary | ICD-10-CM | POA: Diagnosis present

## 2019-10-24 LAB — COMPREHENSIVE METABOLIC PANEL
ALT: 60 U/L — ABNORMAL HIGH (ref 0–44)
AST: 80 U/L — ABNORMAL HIGH (ref 15–41)
Albumin: 2.7 g/dL — ABNORMAL LOW (ref 3.5–5.0)
Alkaline Phosphatase: 189 U/L — ABNORMAL HIGH (ref 38–126)
Anion gap: 11 (ref 5–15)
BUN: 11 mg/dL (ref 8–23)
CO2: 18 mmol/L — ABNORMAL LOW (ref 22–32)
Calcium: 8.8 mg/dL — ABNORMAL LOW (ref 8.9–10.3)
Chloride: 103 mmol/L (ref 98–111)
Creatinine, Ser: UNDETERMINED mg/dL (ref 0.61–1.24)
Glucose, Bld: 96 mg/dL (ref 70–99)
Potassium: 4.1 mmol/L (ref 3.5–5.1)
Sodium: 132 mmol/L — ABNORMAL LOW (ref 135–145)
Total Bilirubin: 24.1 mg/dL (ref 0.3–1.2)
Total Protein: 7.2 g/dL (ref 6.5–8.1)

## 2019-10-24 LAB — CBC
HCT: 37.8 % — ABNORMAL LOW (ref 39.0–52.0)
Hemoglobin: 12.5 g/dL — ABNORMAL LOW (ref 13.0–17.0)
MCH: 29.1 pg (ref 26.0–34.0)
MCHC: 33.1 g/dL (ref 30.0–36.0)
MCV: 87.9 fL (ref 80.0–100.0)
Platelets: 253 10*3/uL (ref 150–400)
RBC: 4.3 MIL/uL (ref 4.22–5.81)
RDW: 16.1 % — ABNORMAL HIGH (ref 11.5–15.5)
WBC: 4.5 10*3/uL (ref 4.0–10.5)
nRBC: 0 % (ref 0.0–0.2)

## 2019-10-24 LAB — AMMONIA: Ammonia: UNDETERMINED umol/L (ref 9–35)

## 2019-10-24 LAB — LIPASE, BLOOD: Lipase: 15 U/L (ref 11–51)

## 2019-10-24 NOTE — ED Provider Notes (Signed)
[facility] Emergency Department Provider Note  ____________________________________________   First MD Initiated Contact with Patient 10/24/19 1815     (approximate)  I have reviewed the triage vital signs and the nursing notes.  History  Chief Complaint Abdominal Pain    HPI Joe [name] is a 74 y.o. male hx pancreatitis, alcohol liver disease, s/p whipple (2015), biliary stenting (followed at [facility]) who presents who presents for scleral icterus, jaundice, and mild diffuse abdominal pain. He typically gets these symptoms when he needs his biliary stent exchanged (which he gets ~yearly).   Per daughter at bedside, patient has had progressive, worsening icterus for about a week and a half.  Over the last few days he reportedly complained of vague abdominal pain to his nursing facility, though currently denies any in the ER. No nausea, vomiting, diarrhea. No fevers. He states he feels well.  Daughter states he needs his biliary stents exchanged about every year or so, and typically he develops significant scleral icterus prior to exchange. Last exchange per chart review was in March 2020, notes indicate repeat ERCP indicated in 1 year for exchange.   Patient is on anticoagulation for hx of DVT.   Past Medical Hx Past Medical History:  Diagnosis Date  . Pancreatitis   . Patient denies medical problems     Problem List Patient Active Problem List   Diagnosis Date Noted  . Malnutrition of moderate degree 03/28/2018  . Acute pancreatitis   . Thrombocytopenia ([place])   . Acute deep vein thrombosis (DVT) of proximal vein of right lower extremity ([place])   . Acute on chronic pancreatitis ([place]) 03/19/2018    Past Surgical Hx Past Surgical History:  Procedure Laterality Date  . WHIPPLE PROCEDURE      Medications Prior to Admission medications   Medication Sig Start Date End Date Taking? Authorizing Provider  amLODipine (NORVASC) 5 MG tablet Take 1 tablet (5  mg total) by mouth daily. 03/29/18   Fritzi Mandes, MD  aspirin 325 MG tablet Take 1 tablet (325 mg total) by mouth daily. 03/29/18   Fritzi Mandes, MD  carvedilol (COREG) 6.25 MG tablet Take 1 tablet (6.25 mg total) by mouth 2 (two) times daily with a meal. 03/29/18   Fritzi Mandes, MD  feeding supplement, ENSURE ENLIVE, (ENSURE ENLIVE) LIQD Take 237 mLs by mouth 2 (two) times daily between meals. 03/29/18   Fritzi Mandes, MD  fondaparinux (ARIXTRA) 7.5 MG/0.6ML SOLN injection Inject 0.6 mLs (7.5 mg total) into the skin daily. 03/29/18   Fritzi Mandes, MD  lipase/protease/amylase (CREON) 36000 UNITS CPEP capsule Take 72,000 Units by mouth 3 (three) times daily with meals.    [provider]  mirtazapine (REMERON) 15 MG tablet Take 15 mg by mouth at bedtime.    [provider]  Multiple Vitamin (MULTIVITAMIN WITH MINERALS) TABS tablet Take 1 tablet by mouth daily. 03/29/18   Fritzi Mandes, MD  oxyCODONE (ROXICODONE) 5 MG immediate release tablet Take 1 tablet (5 mg total) by mouth every 6 (six) hours as needed for severe pain. 12/20/18   Eula Listen, MD  pantoprazole (PROTONIX) 40 MG tablet Take 1 tablet (40 mg total) by mouth daily. 03/29/18   Fritzi Mandes, MD    Allergies Heparin and Morphine and related  Family Hx Family History  Problem Relation Age of Onset  . Brain cancer Mother   . Heart disease Brother     Social Hx Social History   Tobacco Use  . Smoking status: Current  Every Day Smoker    Packs/day: 0.50    Years: 50.00    Pack years: 25.00    Types: Cigarettes  . Smokeless tobacco: Current User  Substance Use Topics  . Alcohol use: Not Currently  . Drug use: Not Currently     Review of Systems  Constitutional: Negative for fever, chills. Eyes: + scleral icterus ENT: Negative for sore throat. Cardiovascular: Negative for chest pain. Respiratory: Negative for shortness of breath. Gastrointestinal: Negative for nausea, vomiting.  Genitourinary: Negative for  dysuria. Musculoskeletal: Negative for leg swelling. Skin: Negative for rash. Neurological: Negative for for headaches.   Physical Exam  Vital Signs: ED Triage Vitals  Enc Vitals Group     BP 10/24/19 1536 128/63     Pulse Rate 10/24/19 1536 73     Resp 10/24/19 1536 20     Temp 10/24/19 1536 99.2 F (37.3 C)     Temp Source 10/24/19 1536 Oral     SpO2 10/24/19 1536 99 %     Weight 10/24/19 1537 150 lb (68 kg)     Height 10/24/19 1537 5\' 9"  (1.753 m)     Head Circumference --      [facility] Flow --      Pain Score 10/24/19 1537 0     Pain Loc --      Pain Edu? --      Excl. in GC? --     Constitutional: Awake and alert. HOH. Head: Normocephalic. Atraumatic. Eyes: Conjunctivae clear. Sclera significantly icteric.  Nose: No congestion. No rhinorrhea. Mouth/Throat: Wearing mask.  Neck: No stridor.   Cardiovascular: Normal rate, regular rhythm. Extremities well perfused. Respiratory: Normal respiratory effort.  Lungs CTAB. Gastrointestinal: Soft. Mildly protuberant. Old, well healed whipple scar. Non-tender throughout to deep palpation.  Musculoskeletal: No lower extremity edema. No deformities. Neurologic:  Normal speech and language. No gross focal neurologic deficits are appreciated.  Skin: Mild underlying jaundice. Psychiatric: Mood and affect are appropriate for situation.  EKG  Personally reviewed.   Rate: 73 Rhythm: sinus Axis: leftward Intervals: WNL Significant artifact, NSR, no acute ischemic changes appreciated No STEMI    Radiology  CT A/P pending.   Procedures  Procedure(s) performed (including critical care):  Procedures   Initial Impression / Assessment and Plan / ED Course  74 y.o. male who presents to the ED for scleral icterus, generalized abdominal pain, likely needed his yearly biliary stent exchanged.   Labs reveal total bilirubin of 24.1. AST and ALT 80, 60. No leukocytosis, no fever, no abdominal pain on exam or subjectively at  present. Do not suspect cholangitis, SBP, or other acute infection at this time.   Given his labs, suspect patient needs his stent exchanged. Discussed case and labs with [facility] GI, who states given he is otherwise stable w/o evidence of infection, his stent exchange can be done on an outpatient basis. They will call tomorrow AM to schedule. CT still pending at this time, presuming it is stable compared to prior, patient okay for discharge with outpatient [facility] GI follow up. Patient and daughter at bedside are comfortable with plan.   Final Clinical Impression(s) / ED Diagnosis  Final diagnoses:  Elevated bilirubin       Note:  This document was prepared using Dragon voice recognition software and may include unintentional dictation errors.   [facility] MD 10/24/19 [phone]

## 2019-10-24 NOTE — ED Notes (Signed)
Pt present to the ED via EMS from [facility]. Per pt's daughter, pt has an extensive GI history. Pt was c/o abd pain and yellowing of the pt' sclera.

## 2019-10-24 NOTE — ED Triage Notes (Signed)
First nurse note- c/o abd pain today. Arrived EMS from [facility].  Confused.  VSS with EMS. Hx alcoholic liver disease

## 2019-10-24 NOTE — ED Triage Notes (Signed)
Pt brought in via ems from [facility].  Pt has abd pain.  Hx dementia, alcohol liver disease.  Pt alert.  No n/v/d

## 2019-10-24 NOTE — ED Notes (Signed)
Dr Colon Branch notified total bili 24.1.  Unable to calculate creatinine or ammonia.

## 2019-10-24 NOTE — ED Notes (Signed)
IV team at bedside 

## 2019-10-25 ENCOUNTER — Emergency Department: Payer: Medicare Other

## 2019-10-25 ENCOUNTER — Encounter: Payer: Self-pay | Admitting: Radiology

## 2019-10-25 DIAGNOSIS — R7989 Other specified abnormal findings of blood chemistry: Secondary | ICD-10-CM | POA: Diagnosis not present

## 2019-10-25 LAB — RESPIRATORY PANEL BY RT PCR (FLU A&B, COVID)
Influenza A by PCR: NEGATIVE
Influenza B by PCR: NEGATIVE
SARS Coronavirus 2 by RT PCR: NEGATIVE

## 2019-10-25 LAB — URINALYSIS, COMPLETE (UACMP) WITH MICROSCOPIC
Glucose, UA: NEGATIVE mg/dL
Hgb urine dipstick: NEGATIVE
Ketones, ur: NEGATIVE mg/dL
Leukocytes,Ua: NEGATIVE
Nitrite: NEGATIVE
Protein, ur: 30 mg/dL — AB
Specific Gravity, Urine: 1.025 (ref 1.005–1.030)
pH: 5 (ref 5.0–8.0)

## 2019-10-25 LAB — BILIRUBIN, DIRECT: Bilirubin, Direct: 14.3 mg/dL — ABNORMAL HIGH (ref 0.0–0.2)

## 2019-10-25 LAB — PROTIME-INR
INR: 1.3 — ABNORMAL HIGH (ref 0.8–1.2)
Prothrombin Time: 15.8 seconds — ABNORMAL HIGH (ref 11.4–15.2)

## 2019-10-25 MED ORDER — IOHEXOL 9 MG/ML PO SOLN
500.0000 mL | Freq: Once | ORAL | Status: DC | PRN
  Administered 2019-10-25: 500 mL via ORAL

## 2019-10-25 MED ORDER — IOHEXOL 300 MG/ML  SOLN: 100.0000 mL | Freq: Once | INTRAMUSCULAR | Status: DC | PRN

## 2019-10-25 NOTE — ED Notes (Signed)
EMS  CALLED  FOR  TRANSPORT  TO  [facility] 

## 2019-10-25 NOTE — ED Provider Notes (Signed)
-----------------------------------------   12:04 AM on 10/25/2019 -----------------------------------------  Assumed care of patient who is pending CT scan.  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  [facility] was contacted by previous provider who recommends discharge home if CT is stable and patient will be contacted for GI follow-up.  Patient is a very difficult stick and has an IV in the foot.  Have changed CT scan to oral contrast only.   ----------------------------------------- 3:14 AM on 10/25/2019 -----------------------------------------  CT abdomen/pelvis interpreted per Dr. Gwenyth Bender:  1. Interval removal of one of the gastrohepatic biliary drains with  a one remaining drain.  2. Diffuse intrahepatic biliary ductal dilatation similar or  slightly increased since the prior CT.  3. Colonic diverticulosis. No bowel obstruction.  4. Bilateral L5 pars defects with grade 1 L5-S1 anterolisthesis.  5. Aortic Atherosclerosis (ICD10-I70.0).   Patient resting in no acute distress.  Updated patient and his daughter of stable CT results.  He will follow closely with [facility].  Strict return precautions given.  Both verbalized understanding and agree with plan of care.   Irean Hong, MD 10/25/19 [phone]

## 2019-10-25 NOTE — ED Notes (Signed)
Patient assisted to toilet, ambulatory with standby assist

## 2019-10-25 NOTE — ED Notes (Signed)
Pt back from CT

## 2019-10-25 NOTE — ED Notes (Signed)
Pt drinking CT contrast without difficulty. 

## 2019-10-25 NOTE — Discharge Instructions (Addendum)
[facility] should call you in the morning to schedule a follow-up appointment.  Please call them if you do not hear from them by the afternoon.  Return to the ER for worsening symptoms, persistent vomiting, fever, difficulty breathing or other concerns.

## 2019-10-25 NOTE — ED Notes (Signed)
Report called to Latrela at [facility] and discharged instructions also reviewed with daughter at bedside. Daughter states she is unable to transport back to facility, and requests EMS transport back.

## 2019-10-25 NOTE — ED Notes (Signed)
Patient transported to CT 

## 2020-07-16 IMAGING — CT CT ABD-PELV W/ CM
2 of 5 series · 16 of 46 positions shown, 18 images · IV contrast (APPLIED)
Comparison: CT abdomen and pelvis 0 6/0 [DATE].

CLINICAL DATA: Nausea, vomiting and abdominal pain. History of
Whipple procedure.

EXAM:
CT ABDOMEN AND PELVIS WITH CONTRAST
TECHNIQUE: Multidetector CT imaging of the abdomen and pelvis was performed
using the standard protocol following bolus administration of
intravenous contrast.
CONTRAST:  100 mL OMNIPAQUE IOHEXOL 300 MG/ML  SOLN

[Series 2: routine abd/pel with · axial · 0.74mm/px · z∈[-1218,-864]mm · 13 of 83 slices shown, 15 images]
[im 6/83  soft-tissue]
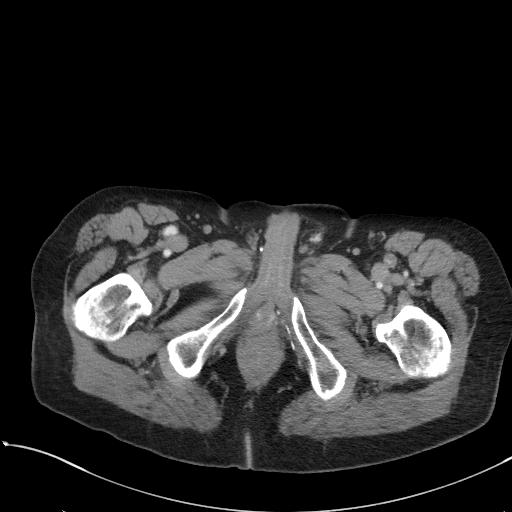
[im 6/83  bone]
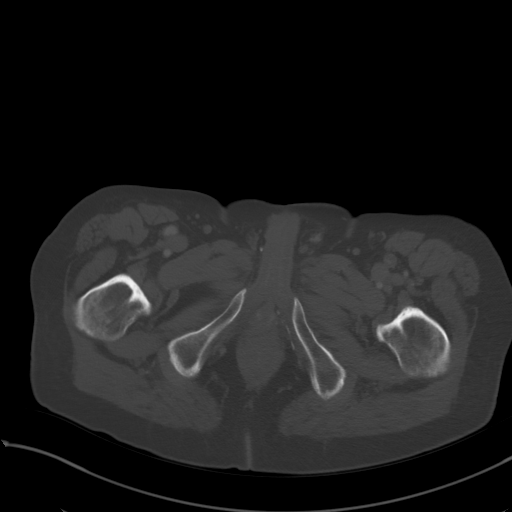
[im 11/83  soft-tissue]
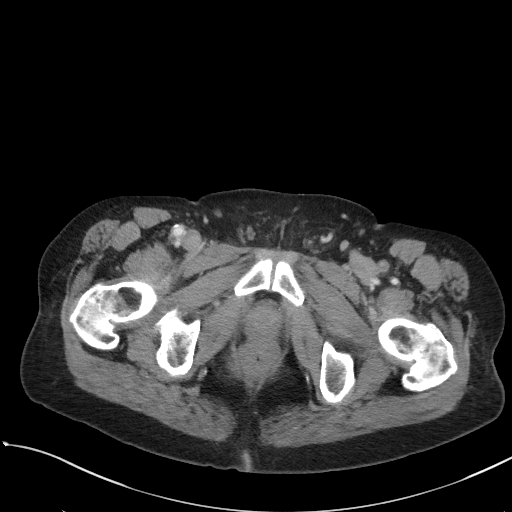
[im 16/83  soft-tissue]
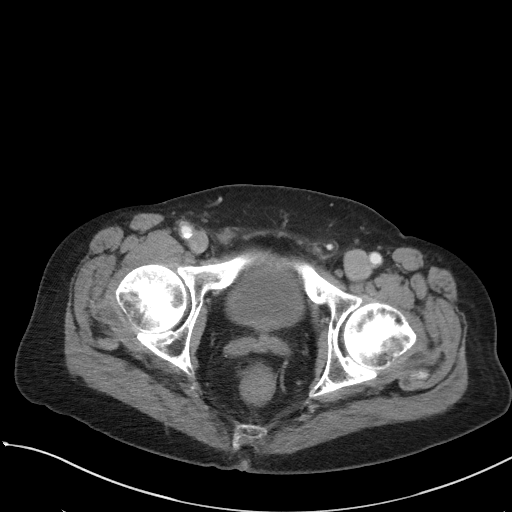
[im 26/83  soft-tissue]
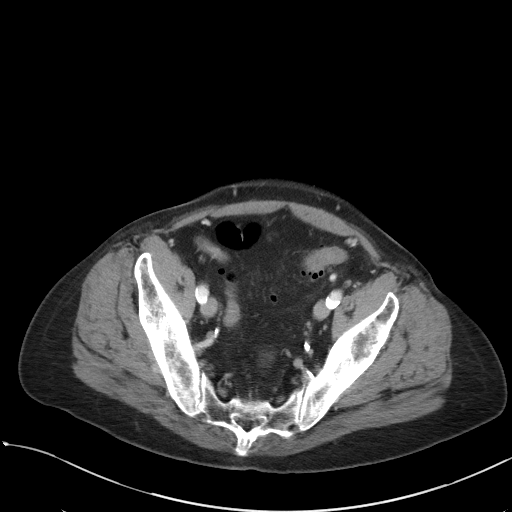
[im 31/83  soft-tissue]
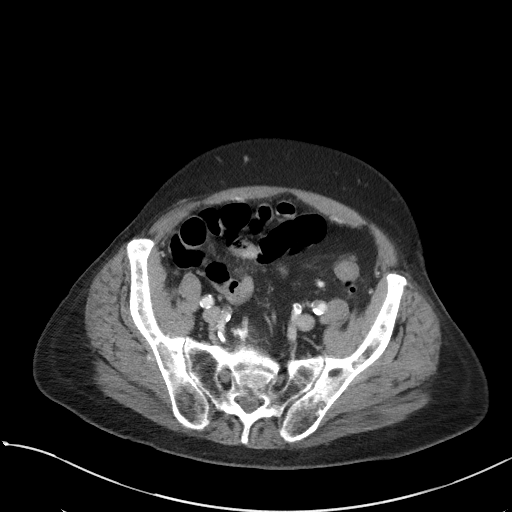
[im 36/83  soft-tissue]
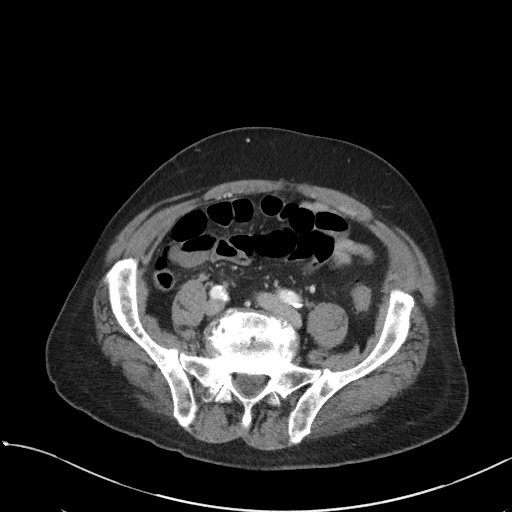
[im 42/83  soft-tissue]
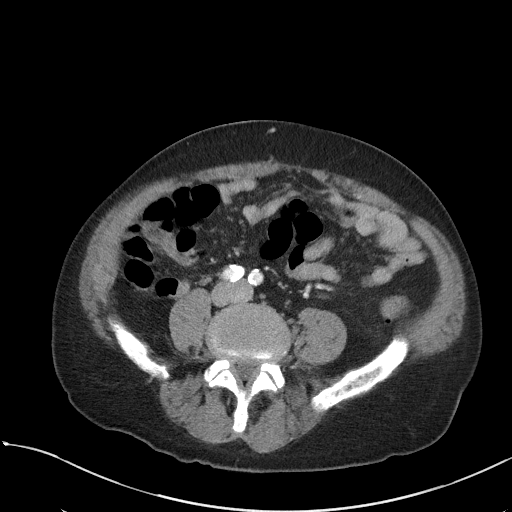
[im 47/83  soft-tissue]
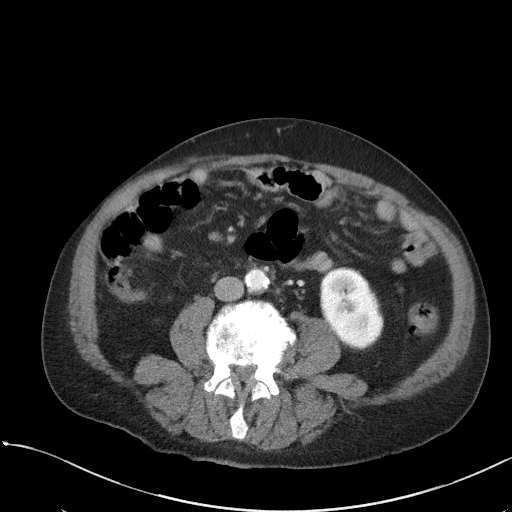
[im 52/83  soft-tissue]
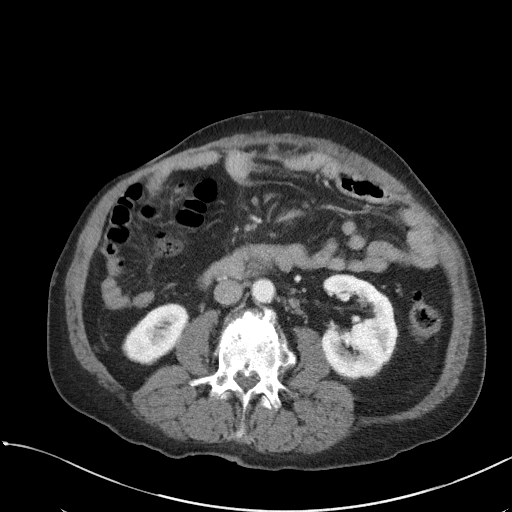
[im 52/83  bone]
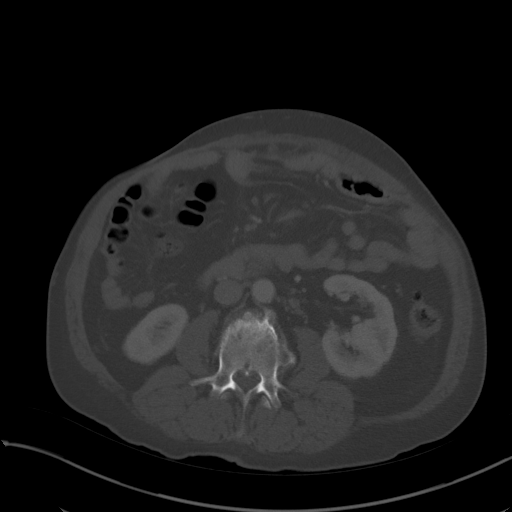
[im 57/83  soft-tissue]
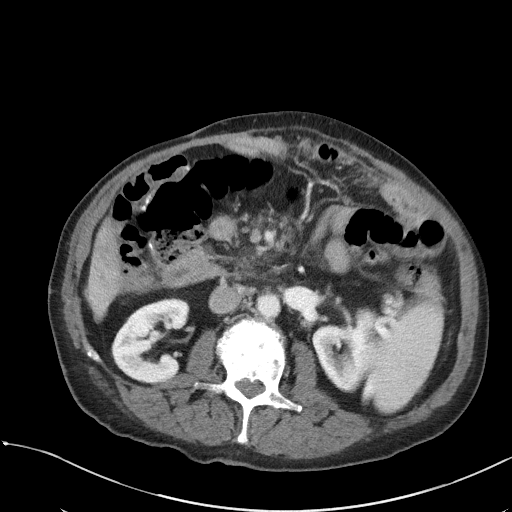
[im 67/83  soft-tissue]
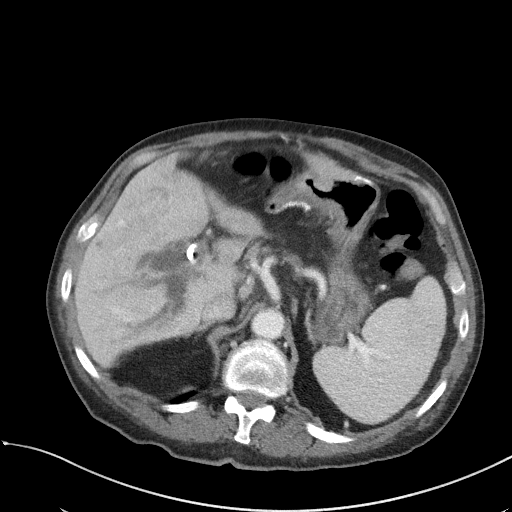
[im 72/83  soft-tissue]
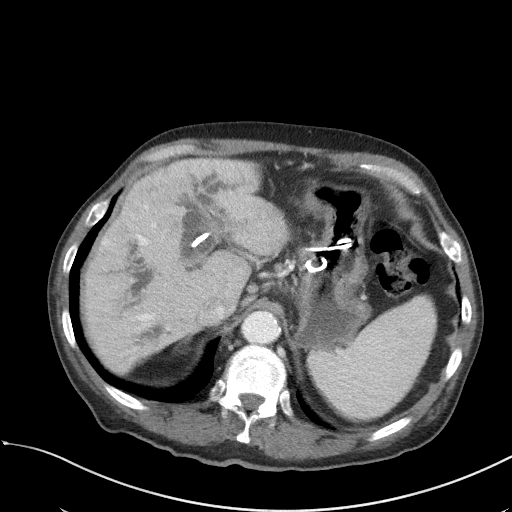
[im 77/83  soft-tissue]
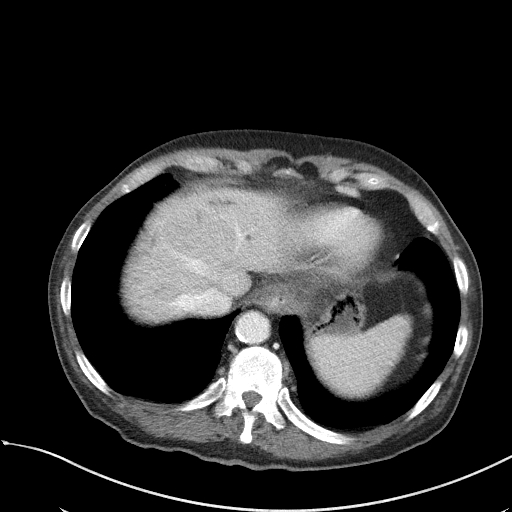

[Series 5: coronal st · coronal · 0.73mm/px · 3 of 94 slices shown]
[im 32/94  soft-tissue]
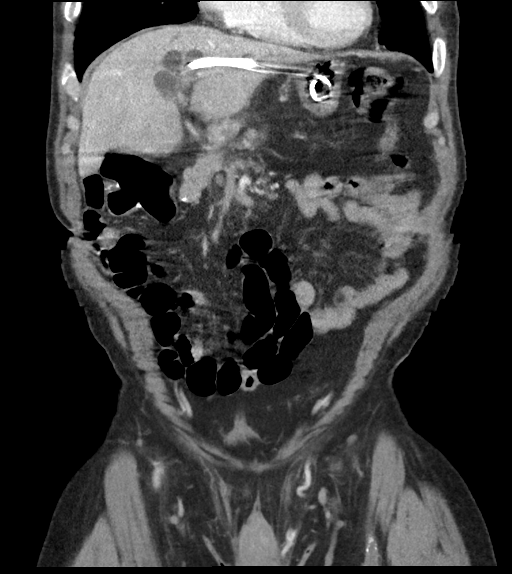
[im 42/94  soft-tissue]
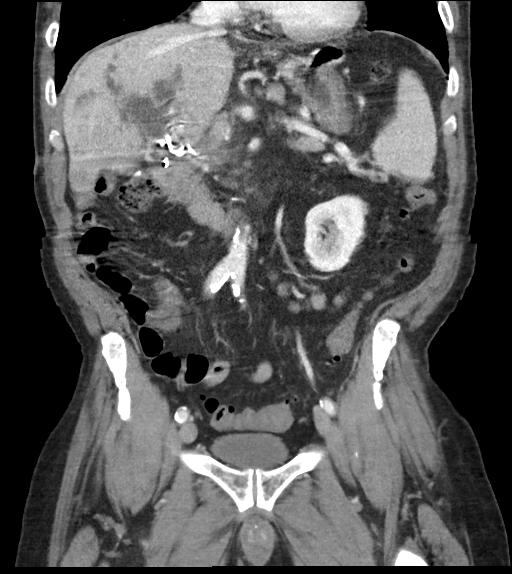
[im 52/94  soft-tissue]
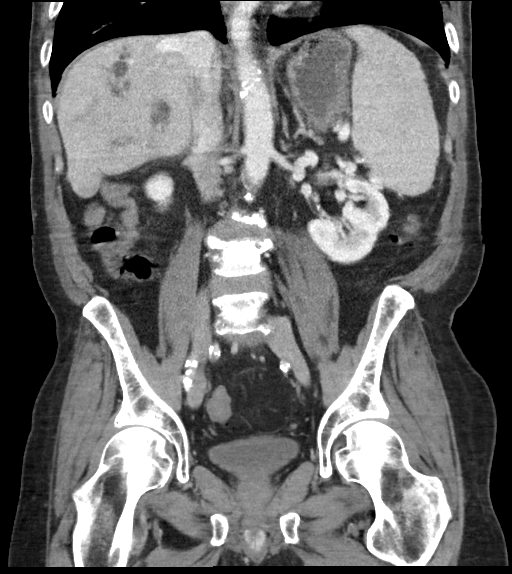

[16 of 46 positions shown; findings below may reference images not displayed]

FINDINGS: Lower chest: Lung bases are emphysematous but clear.

Hepatobiliary: Two gastrohepatic biliary drainage catheters remain
in place and are unchanged in position. One catheter extends from
the common bile duct into the stomach and the second extends from
the proximal small bowel into the stomach. Intrahepatic biliary
ductal dilatation appears worse than on the prior exam. No focal
liver lesion is identified.

Pancreas: Multiple punctate calcifications in the pancreas and mild
dilatation of the pancreatic duct in the distal body and head are
unchanged. No focal lesion is identified.

Spleen: No focal lesion. The spleen measures approximately 13 cm
craniocaudal, unchanged.

Adrenals/Urinary Tract: Adrenal glands are unremarkable. Kidneys are
normal, without renal calculi, focal lesion, or hydronephrosis.
Bladder is unremarkable.

Stomach/Bowel: The patient is status post right hemicolectomy.
Diverticulosis without diverticulitis is most notable in the
sigmoid. Postoperative change of distal gastrectomy is also
identified. No evidence of bowel obstruction or inflammatory change.

Vascular/Lymphatic: Aortic atherosclerosis. No enlarged abdominal or
pelvic lymph nodes.

Reproductive: Prostate is unremarkable.

Other: None.

Musculoskeletal: Chronic bilateral L5 pars articularis defects
result in unchanged 0.5 cm anterolisthesis L5 on S1. Scattered
degenerative disc disease is worst at L2-3 and L3-4 but unchanged in
appearance. Convex right scoliosis noted.
IMPRESSION: Two gastrohepatic biliary drainage catheters are unchanged in
position. Intrahepatic biliary ductal dilatation is worse than on
the prior exam.

Status post right hemicolectomy and partial gastrectomy.

Atherosclerosis.

Diverticulosis without diverticulitis.

Changes of chronic pancreatitis.

Emphysema.

Spondylosis. Chronic bilateral L5 pars interarticularis defects
cause unchanged 0.5 cm anterolisthesis L5 on S1.

## 2021-06-03 ENCOUNTER — Other Ambulatory Visit: Payer: Self-pay

## 2021-06-03 ENCOUNTER — Encounter (INDEPENDENT_AMBULATORY_CARE_PROVIDER_SITE_OTHER): Payer: Self-pay

## 2021-06-03 ENCOUNTER — Inpatient Hospital Stay: Payer: Medicare Other

## 2021-06-03 ENCOUNTER — Encounter: Payer: Self-pay | Admitting: Oncology

## 2021-06-03 ENCOUNTER — Inpatient Hospital Stay: Payer: Medicare Other | Attending: Oncology | Admitting: Oncology

## 2021-06-03 VITALS — BP 118/67 | HR 68 | Temp 96.6°F | Resp 16 | Ht 69.0 in | Wt 148.0 lb

## 2021-06-03 DIAGNOSIS — Z87891 Personal history of nicotine dependence: Secondary | ICD-10-CM | POA: Diagnosis not present

## 2021-06-03 DIAGNOSIS — Z808 Family history of malignant neoplasm of other organs or systems: Secondary | ICD-10-CM | POA: Insufficient documentation

## 2021-06-03 DIAGNOSIS — D696 Thrombocytopenia, unspecified: Secondary | ICD-10-CM | POA: Insufficient documentation

## 2021-06-03 DIAGNOSIS — K769 Liver disease, unspecified: Secondary | ICD-10-CM

## 2021-06-03 DIAGNOSIS — K7460 Unspecified cirrhosis of liver: Secondary | ICD-10-CM | POA: Insufficient documentation

## 2021-06-03 DIAGNOSIS — I10 Essential (primary) hypertension: Secondary | ICD-10-CM | POA: Diagnosis not present

## 2021-06-03 DIAGNOSIS — Z86718 Personal history of other venous thrombosis and embolism: Secondary | ICD-10-CM

## 2021-06-03 DIAGNOSIS — Z0000 Encounter for general adult medical examination without abnormal findings: Secondary | ICD-10-CM

## 2021-06-03 DIAGNOSIS — Z119 Encounter for screening for infectious and parasitic diseases, unspecified: Secondary | ICD-10-CM

## 2021-06-03 DIAGNOSIS — Z114 Encounter for screening for human immunodeficiency virus [HIV]: Secondary | ICD-10-CM

## 2021-06-03 NOTE — Progress Notes (Addendum)
Hematology/Oncology Consult note [facility] [phone] [phone]   Patient Care Team: Patient, [name] Per (Inactive) as PCP - General (General Practice)  REFERRING PROVIDER: [place], [facility]  CHIEF COMPLAINTS/REASON FOR VISIT:  Evaluation of thrombocytopenia  HISTORY OF PRESENTING ILLNESS:   Joe [name] is a  75 y.o.  male with PMH listed below was seen in consultation at the request of  [place], [facility]  for evaluation of thrombocytopenia Patient is a nursing home resident.  He was accompanied by her daughter Patient is a poor historian not able to provide medical history due to encephalopathy, stroke.  Medical records faxed from nursing home facility was reviewed. He has a history of chronic liver cirrhosis, encephalopathy, stroke, hypertension, DVT on Xarelto 20 mg daily, chronic pancreatitis, anemia and history of alcohol abuse.  Patient has chronic thrombocytopenia which appears to be decreasing.  He was referred to establish care with hematology for further evaluation.  There is no history of bleeding, black or bloody stool.  Per daughter, patient follows-up with gastroenterology. Patient has hepatic duct stent Patient has had multiple ERCP in the past that he [facility] for stent exchange.  Review of Systems  Unable to perform ROS: Other (Chronic encephalopathy)    MEDICAL HISTORY:  Past Medical History:  Diagnosis Date   Alcoholic liver disease (HCC)    Cerebral infarction (HCC)    Chronic pancreatitis (HCC)    Depression    DVT, lower extremity (HCC)    Dysphagia, oropharyngeal phase    GERD (gastroesophageal reflux disease)    Hypertension    Iron deficiency anemia    Muscle weakness (generalized)    Pancreatitis    Patient denies medical problems    Personal history of transient ischemic attack    Thrombocytopenia, unspecified (HCC)    Vitamin D deficiency, unspecified     SURGICAL HISTORY: Past  Surgical History:  Procedure Laterality Date   WHIPPLE PROCEDURE      SOCIAL HISTORY: Social History   Socioeconomic History   Marital status: Single    Spouse name: Not on file   Number of children: Not on file   Years of education: Not on file   Highest education level: Not on file  Occupational History   Not on file  Tobacco Use   Smoking status: Former    Packs/day: 0.50    Years: 50.00    Pack years: 25.00    Types: Cigarettes    Quit date: 02/16/2017    Years since quitting: 4.2   Smokeless tobacco: Current  Substance and Sexual Activity   Alcohol use: Not Currently   Drug use: Not Currently   Sexual activity: Not on file  Other Topics Concern   Not on file  Social History Narrative   Not on file   Social Determinants of Health   Financial Resource Strain: Not on file  Food Insecurity: Not on file  Transportation Needs: Not on file  Physical Activity: Not on file  Stress: Not on file  Social Connections: Not on file  Intimate Partner Violence: Not on file    FAMILY HISTORY: Family History  Problem Relation Age of Onset   Brain cancer Mother    Heart disease Brother     ALLERGIES:  is allergic to heparin, morphine and related, and oxycodone-acetaminophen.  MEDICATIONS:  Current Outpatient Medications  Medication Sig Dispense Refill   amLODipine (NORVASC) 2.5 MG tablet Take 2.5 mg by mouth daily.     carvedilol (COREG)  3.125 MG tablet Take 3.125 mg by mouth 2 (two) times daily.     Cholecalciferol 1.25 MG (50000 UT) TABS Take by mouth.     lactulose (CEPHULAC) 10 g packet Take 10 g by mouth 3 (three) times daily.     lipase/protease/amylase (CREON) 36000 UNITS CPEP capsule Take 72,000 Units by mouth 3 (three) times daily with meals.     melatonin 3 MG TABS tablet Take 3 mg by mouth at bedtime.     Multiple Vitamin (MULTIVITAMIN WITH MINERALS) TABS tablet Take 1 tablet by mouth daily. 30 tablet 1   oxyCODONE (ROXICODONE) 5 MG immediate release tablet  Take 1 tablet (5 mg total) by mouth every 6 (six) hours as needed for severe pain. 6 tablet 0   pantoprazole (PROTONIX) 40 MG tablet Take 1 tablet (40 mg total) by mouth daily. 30 tablet 1   XARELTO 20 MG TABS tablet Take 20 mg by mouth daily.     XIFAXAN 550 MG TABS tablet Take 550 mg by mouth 2 (two) times daily.     No current facility-administered medications for this visit.     PHYSICAL EXAMINATION: ECOG PERFORMANCE STATUS: 2 - Symptomatic, <50% confined to bed Vitals:   06/03/21 1051  BP: 118/67  Pulse: 68  Resp: 16  Temp: (!) 96.6 F (35.9 C)  SpO2: 100%   Filed Weights   06/03/21 1051  Weight: 148 lb (67.1 kg)    Physical Exam Constitutional:      General: He is not in acute distress. HENT:     Head: Normocephalic and atraumatic.  Eyes:     General: No scleral icterus. Cardiovascular:     Rate and Rhythm: Normal rate and regular rhythm.     Heart sounds: Normal heart sounds.  Pulmonary:     Effort: Pulmonary effort is normal. No respiratory distress.     Breath sounds: No wheezing.  Abdominal:     General: Bowel sounds are normal. There is no distension.     Palpations: Abdomen is soft.  Musculoskeletal:        General: No deformity. Normal range of motion.     Cervical back: Normal range of motion and neck supple.  Skin:    General: Skin is warm and dry.     Findings: No erythema or rash.  Neurological:     Mental Status: He is alert and oriented to person, place, and time. Mental status is at baseline.     Cranial Nerves: No cranial nerve deficit.     Coordination: Coordination normal.  Psychiatric:        Mood and Affect: Mood normal.    LABORATORY DATA:  I have reviewed the data as listed Lab Results  Component Value Date   WBC 4.5 10/24/2019   HGB 12.5 (L) 10/24/2019   HCT 37.8 (L) 10/24/2019   MCV 87.9 10/24/2019   PLT 253 10/24/2019   No results for input(s): NA, K, CL, CO2, GLUCOSE, BUN, CREATININE, CALCIUM, GFRNONAA, GFRAA, PROT,  ALBUMIN, AST, ALT, ALKPHOS, BILITOT, BILIDIR, IBILI in the last 8760 hours. Iron/TIBC/Ferritin/ %Sat No results found for: IRON, TIBC, FERRITIN, IRONPCTSAT    RADIOGRAPHIC STUDIES: I have personally reviewed the radiological images as listed and agreed with the findings in the report. No results found.    ASSESSMENT & PLAN:  1. Thrombocytopenia (HCC)   2. Hepatic cirrhosis, unspecified hepatic cirrhosis type, unspecified whether ascites present (HCC)   3. Screening examination for infectious disease   4. Screening for  HIV (human immunodeficiency virus)   5. Healthcare maintenance   6. Liver disease   7. History of DVT (deep vein thrombosis)    #Chronic thrombocytopenia, 85,000 on most recent blood work done  at [facility].  Most likely secondary to hepatic cirrhosis.  Rule out other etiologies. Check CBC, smear, folate, vitamin B12, LDH, immature platelet fraction, SPEP, HIV, hepatitis panel, flow cytometry.  #Cirrhosis, previously follows up with [facility].  Recommend patient to establish care with gastroenterology for further evaluation.  Daughter will further discussed with nursing home provider.  #History of DVT, currently on Xarelto 20 mg daily.  Given patient's comorbidities, bleeding risk, I recommend patient to decrease Xarelto to 10 mg daily.  Orders Placed This Encounter  Procedures   CBC with Differential/Platelet    Standing Status:   Future    Number of Occurrences:   1    Standing Expiration Date:   06/03/2022   CBC with Differential/Platelet    Standing Status:   Future    Standing Expiration Date:   06/03/2022   Comprehensive metabolic panel    Standing Status:   Future    Standing Expiration Date:   06/03/2022   Folate    Standing Status:   Future    Standing Expiration Date:   06/03/2022   Vitamin B12    Standing Status:   Future    Standing Expiration Date:   06/03/2022   Lactate dehydrogenase    Standing Status:   Future    Standing Expiration Date:    06/03/2022   Immature Platelet Fraction    Standing Status:   Future    Standing Expiration Date:   06/03/2022   Protein electrophoresis, serum    Standing Status:   Future    Standing Expiration Date:   06/03/2022   Hepatitis panel, acute    Standing Status:   Future    Standing Expiration Date:   06/03/2022   HIV Antibody (routine testing w rflx)    Standing Status:   Future    Standing Expiration Date:   06/03/2022   Technologist smear review    Standing Status:   Future    Standing Expiration Date:   06/03/2022   Flow cytometry panel-leukemia/lymphoma work-up    Standing Status:   Future    Standing Expiration Date:   06/03/2022    All questions were answered. The patient knows to call the clinic with any problems questions or concerns.  cc [place], [facility]    Return of visit: To be determined, pending lab work Thank you for this kind referral and the opportunity to participate in the care of this patient. A copy of today's note is routed to referring provider    Rickard Patience, MD, PhD Hematology Oncology [facility] 06/03/2021

## 2021-06-05 ENCOUNTER — Inpatient Hospital Stay: Payer: Medicare Other

## 2021-06-06 ENCOUNTER — Inpatient Hospital Stay: Payer: Medicare Other

## 2021-06-17 ENCOUNTER — Inpatient Hospital Stay: Payer: Medicare Other

## 2022-01-30 ENCOUNTER — Non-Acute Institutional Stay: Payer: Medicare Other | Admitting: Primary Care

## 2022-01-30 DIAGNOSIS — G3184 Mild cognitive impairment, so stated: Secondary | ICD-10-CM | POA: Insufficient documentation

## 2022-01-30 DIAGNOSIS — Z515 Encounter for palliative care: Secondary | ICD-10-CM

## 2022-01-30 DIAGNOSIS — E440 Moderate protein-calorie malnutrition: Secondary | ICD-10-CM

## 2022-01-30 DIAGNOSIS — K7030 Alcoholic cirrhosis of liver without ascites: Secondary | ICD-10-CM

## 2022-01-30 NOTE — Progress Notes (Signed)
 Therapist, nutritional Palliative Care Consult Note Telephone: [phone]  Fax: [phone]   Date of encounter: 01/30/22 12:00 pm PATIENT NAME: Joe [name] [address] [place] [address]   [phone] (home)  DOB: 1946/04/12 MRN: [id number] PRIMARY CARE PROVIDER:    Eilleen Query, MD,  [address] [place] [address] [phone]  REFERRING PROVIDER:   Eilleen Query, MD,  [address] [place] [address] [phone]  RESPONSIBLE PARTY:    Contact Information     Name Relation Home Work Mobile   shateace,bland Daughter   [phone]   DAVIS,STEPHANIE Daughter [phone]          [facility]. Palliative Care was asked to follow this patient by consultation request of  Eilleen Query, MD  to address advance care planning and complex medical decision making. This is the initial visit.                                     ASSESSMENT AND PLAN / RECOMMENDATIONS:   Advance Care Planning/Goals of Care: Goals include to maximize quality of life and symptom management. Patient/health care surrogate gave his/her permission to discuss.Our advance care planning conversation included a discussion about:    The value and importance of advance care planning  Experiences with loved ones who have been seriously ill or have died  Exploration of personal, cultural or spiritual beliefs that might influence medical decisions  Exploration of goals of care in the event of a sudden injury or illness  Identification of a healthcare agent - daughter Review of an  advance directive document . Decision not to resuscitate or to de-escalate disease focused treatments due to poor prognosis. CODE STATUS: DNR New MOST is ready for family to sign with DNR, limited scope. During my time seeing with the patient he was able to tell me about his upbringing; he was one of 13 children and grew up on a farm in [place].  He talked about fishing in the river and raising their food on the farm.  I will continue to follow to help Pt/ Family clarify goals of care make advance care planning adjustments and provide medical decision making  I spent 20  minutes providing this consultation. More than 50% of the time in this consultation was spent in counseling and care coordination. --------------------------------------------------------------------------------------- Symptom Management/Plan:  I met with patient in his nursing home. He was alert and oriented times 2 to 3. Staff had explained that he has been refusing lactulose  and that his ammonia levels have been around 104 to 112 consistently for sometime now. He does have a history of alcoholic cirrhosis.   I was asked to consult by nurse practitioner to clarify goals of care. Patient endorses not wanting to take lactulose  due to the stooling side effect. We have  of course discussed this was not just a side effect but with the intended effect so that the ammonia could leave his body. We discuss what elevating ammonia would do to him and that would cause him to have  confusion possibly seizures hallucinations etc.   He talked about his father who had had a brain tumor apparently in the context of seizures or other changes in mental status. I also was able to speak to daughter Ms.Bland. She states that usually her sister oversees patients care but she  was able to speak with me today.   We discussed his mentation as probably being compromise not just from elevated ammonia but also years of alcohol consumption  and other cognative impairment etiologies. We discuss that his self insight may not be such that he truly understands the impact of his actions and that his history of alcohol abuse was also a factor.   She stated that she did not know why he had not been on Xifaxan  but likely was a cost he was not willing to  assume. He has a history of taking.  Follow up Palliative  Care Visit: Palliative care will continue to follow for complex medical decision making, advance care planning, and clarification of goals. Return 2-4 weeks or prn.  This visit was coded based on medical decision making (MDM).  PPS: 40%  HOSPICE ELIGIBILITY/DIAGNOSIS: TBD  Chief Complaint: cognitive impairment  HISTORY OF PRESENT ILLNESS:  Joe [name] is a 76 y.o. year old male  with alcoholic cirrhosis, debility . Patient seen today to review palliative care needs to include medical decision making and advance care planning as appropriate.   History obtained from review of EMR, discussion with primary team, and interview with family, facility staff/caregiver and/or Joe [name].  I reviewed available labs, medications, imaging, studies and related documents from the EMR.  Records reviewed and summarized above.   ROS   General: NAD ENMT: denies dysphagia Cardiovascular: denies chest pain, denies DOE Pulmonary: denies cough, denies increased SOB Abdomen: endorses good appetite, denies constipation, endorses incontinence of bowel GU: denies dysuria, endorses incontinence of urine MSK:  denies increased weakness,  no falls reported Skin: denies rashes or wounds Neurological: denies pain, denies insomnia Psych: Endorses positive mood Heme/lymph/immuno: denies bruises, abnormal bleeding  Physical Exam: Current and past weights: 154 lbs, stable Constitutional: NAD General: frail appearing, thin EYES: anicteric sclera, lids intact, no discharge  ENMT: intact hearing, oral mucous membranes moist, dentition intact CV: no LE edema Pulmonary:  no increased work of breathing, no cough, room air Abdomen: intake 50%,  soft and non tender, no ascites GU: deferred MSK: + sarcopenia, moves all extremities, non ambulatory Skin: warm and dry, no rashes or wounds on visible skin Neuro:  + generalized weakness,  mild cognitive impairment Psych: non-anxious affect, A and O x 2 Hem/lymph/immuno: no  widespread bruising  CURRENT PROBLEM LIST:  Patient Active Problem List   Diagnosis Date Noted   Altered mental status 05/14/2018   Malnutrition of moderate degree 03/28/2018   Acute pancreatitis    Thrombocytopenia (HCC)    Acute deep vein thrombosis (DVT) of proximal vein of right lower extremity (HCC)    Acute on chronic pancreatitis (HCC) 03/19/2018   COPD (chronic obstructive pulmonary disease) (HCC) 04/29/2017   HTN (hypertension) 12/04/2014   Alcoholic cirrhosis of liver (HCC) 05/25/2011   Hyperlipidemia 01/13/2008   PAST MEDICAL HISTORY:  Active Ambulatory Problems    Diagnosis Date Noted   Acute on chronic pancreatitis (HCC) 03/19/2018   Acute pancreatitis    Thrombocytopenia (HCC)    Acute deep vein thrombosis (DVT) of proximal vein of right lower extremity (HCC)    Malnutrition of moderate degree 03/28/2018   Alcoholic cirrhosis of liver (HCC) 05/25/2011   Altered mental status 05/14/2018   COPD (chronic obstructive pulmonary disease) (HCC) 04/29/2017   HTN (hypertension) 12/04/2014   Hyperlipidemia 01/13/2008   Resolved Ambulatory Problems    Diagnosis Date Noted   No Resolved Ambulatory Problems   Past Medical History:  Diagnosis Date  Alcoholic liver disease (HCC)    Cerebral infarction (HCC)    Chronic pancreatitis (HCC)    Depression    DVT, lower extremity (HCC)    Dysphagia, oropharyngeal phase    GERD (gastroesophageal reflux disease)    Hypertension    Iron deficiency anemia    Muscle weakness (generalized)    Pancreatitis    Patient denies medical problems    Personal history of transient ischemic attack    Thrombocytopenia, unspecified (HCC)    Vitamin D deficiency, unspecified    SOCIAL HX:  Social History   Tobacco Use   Smoking status: Former    Packs/day: 0.50    Years: 50.00    Pack years: 25.00    Types: Cigarettes    Quit date: 02/16/2017    Years since quitting: 4.9   Smokeless tobacco: Current  Substance Use Topics    Alcohol use: Not Currently   FAMILY HX:  Family History  Problem Relation Age of Onset   Brain cancer Mother    Heart disease Brother       ALLERGIES:  Allergies  Allergen Reactions   Morphine Hives   Heparin     HIT   Morphine And Related Hives and Itching   Oxycodone -Acetaminophen  Other (See Comments)    Altered mental status, goes crazy per family     PERTINENT MEDICATIONS:  Outpatient Encounter Medications as of 01/30/2022  Medication Sig   amLODipine  (NORVASC ) 2.5 MG tablet Take 2.5 mg by mouth daily.   carvedilol  (COREG ) 3.125 MG tablet Take 3.125 mg by mouth 2 (two) times daily.   Cholecalciferol 1.25 MG (50000 UT) TABS Take by mouth.   lactulose  (CEPHULAC ) 10 g packet Take 10 g by mouth 3 (three) times daily.   lipase/protease/amylase (CREON ) 36000 UNITS CPEP capsule Take 72,000 Units by mouth 3 (three) times daily with meals.   melatonin 3 MG TABS tablet Take 3 mg by mouth at bedtime.   Multiple Vitamin (MULTIVITAMIN WITH MINERALS) TABS tablet Take 1 tablet by mouth daily.   oxyCODONE  (ROXICODONE ) 5 MG immediate release tablet Take 1 tablet (5 mg total) by mouth every 6 (six) hours as needed for severe pain.   pantoprazole  (PROTONIX ) 40 MG tablet Take 1 tablet (40 mg total) by mouth daily.   XARELTO  20 MG TABS tablet Take 20 mg by mouth daily.   XIFAXAN  550 MG TABS tablet Take 550 mg by mouth 2 (two) times daily.   No facility-administered encounter medications on file as of 01/30/2022.   Thank you for the opportunity to participate in the care of Joe [name].  The palliative care team will continue to follow. Please call our office at [phone] if we can be of additional assistance.   Joe Welby Sharps, NP , DNP, AGPCNP-BC  COVID-19 PATIENT SCREENING TOOL Asked and negative response unless otherwise noted:  Have you had symptoms of covid, tested positive or been in contact with someone with symptoms/positive test in the past 5-10 days?

## 2022-03-13 ENCOUNTER — Non-Acute Institutional Stay: Payer: Medicare Other | Admitting: Primary Care

## 2022-03-13 DIAGNOSIS — E440 Moderate protein-calorie malnutrition: Secondary | ICD-10-CM

## 2022-03-13 DIAGNOSIS — G3184 Mild cognitive impairment, so stated: Secondary | ICD-10-CM

## 2022-03-13 DIAGNOSIS — Z515 Encounter for palliative care: Secondary | ICD-10-CM

## 2022-03-13 DIAGNOSIS — K7030 Alcoholic cirrhosis of liver without ascites: Secondary | ICD-10-CM

## 2022-03-13 NOTE — Progress Notes (Signed)
[facility] Consult Note Telephone: [phone]  Fax: [phone]    Date of encounter: 03/13/22 12:47 PM PATIENT NAME: Joe [name] [id number]   [phone] (home)  DOB: 1946-06-10 MRN: [id number] PRIMARY CARE PROVIDER:    Rica Koyanagi, MD,  [place] [address] [phone]  REFERRING PROVIDER:   Rica Koyanagi, [place],  [place] [address] [phone]  RESPONSIBLE PARTY:    Contact Information     Name Relation Home Work Mobile   shateace,bland Daughter   [phone]   DAVIS,STEPHANIE Daughter [phone]          I met face to face with [facility] in [facility]. Palliative Care was asked to follow this patient by consultation request of  Rica Koyanagi, MD to address advance care planning and complex medical decision making. This is a follow up visit.                                   ASSESSMENT AND PLAN / RECOMMENDATIONS:   Advance Care Planning/Goals of Care: Goals include to maximize quality of life and symptom management. Patient/health care surrogate gave his/her permission to discuss.I spoke with daughter Ms Criss Rosales after our visit in person. Our advance care planning conversation included a discussion about:    The value and importance of advance care planning   Exploration of personal, cultural or spiritual beliefs that might influence medical decisions  Exploration of goals of care in the event of a sudden injury or illness  Identification of a healthcare agent - daughters Review  of an  advance directive document . Family will review current full scope and makes some revisions given more debility. CODE STATUS: FULL  I  reviewed current  MOST form today. The patient and family outlined their wishes for the following treatment decisions:  Cardiopulmonary Resuscitation: Attempt Resuscitation (CPR)  Medical Interventions: Full Scope of Treatment:  Use intubation, advanced airway interventions, mechanical ventilation, cardioversion as indicated, medical treatment, IV fluids, etc, also provide comfort measures. Transfer to the hospital if indicated  Antibiotics: Antibiotics if indicated  IV Fluids: IV fluids if indicated  Feeding Tube: Feeding tube long-term if indicated   Symptom Management/Plan:  I met with patient in her nursing home room. She endorses current dysuria and abdominal pain.  She has made improvement back to her baseline after serious illness several months ago. Today she states she would like some relief from the UTI. PCP Np will order Culture and sensitivity. Due to policy they will wait until this is back before treating. However, I asked her to put in a prescription for Pyridium, which she will do. No changes in any advance care planning. POA is her sister.   Follow up Palliative Care Visit: Palliative care will continue to follow for complex medical decision making, advance care planning, and clarification of goals. Return 6 weeks or prn.  I spent 30 minutes providing this consultation. More than 50% of the time in this consultation was spent in counseling and care coordination.  PPS: 30%  HOSPICE ELIGIBILITY/DIAGNOSIS: TBD  Chief Complaint: debility  HISTORY OF PRESENT ILLNESS:  Joe [name] is a 76 y.o. year old male  with cirrhosis, cognitive impairment,immobility. . Patient seen today to review palliative care needs to include medical decision making and advance care planning as appropriate.   History obtained from review  of EMR, discussion with primary team, and interview with family, facility staff/caregiver and/or Mr. Eliot.  I reviewed available labs, medications, imaging, studies and related documents from the EMR.  Records reviewed and summarized above.   ROS   General: NAD ENMT: denies dysphagia Cardiovascular: denies chest pain, denies DOE Pulmonary: denies cough, denies increased SOB Abdomen:  endorses good appetite, denies constipation, endorses continence of bowel GU: denies dysuria, endorses continence of urine MSK:  denies  increased weakness,  no falls reported Skin: denies rashes or wounds Neurological: denies pain, denies insomnia Psych: Endorses positive mood  Physical Exam: Current and past weights: 155 lbs Constitutional: NAD General: frail appearing, thin EYES: anicteric sclera, lids intact, no discharge  ENMT: intact hearing, oral mucous membranes moist CV: no LE edema Pulmonary:  no increased work of breathing, no cough, room air Abdomen: intake 75%, normo-active BS + 4 quadrants, soft and non tender, no ascites MSK: +sarcopenia, moves all extremities, non ambulatory Skin: warm and dry, no rashes or wounds on visible skin Neuro:  + generalized weakness,  + cognitive impairment, non-anxious affect   Thank you for the opportunity to participate in the care of Mr. Prabhakar.  The palliative care team will continue to follow. Please call our office at [phone] if we can be of additional assistance.   Jason Coop, NP DNP, AGPCNP-BC  COVID-19 PATIENT SCREENING TOOL Asked and negative response unless otherwise noted:   Have you had symptoms of covid, tested positive or been in contact with someone with symptoms/positive test in the past 5-10 days?

## 2022-05-06 ENCOUNTER — Non-Acute Institutional Stay: Payer: Medicare Other | Admitting: Primary Care

## 2022-05-06 DIAGNOSIS — J449 Chronic obstructive pulmonary disease, unspecified: Secondary | ICD-10-CM

## 2022-05-06 DIAGNOSIS — Z515 Encounter for palliative care: Secondary | ICD-10-CM

## 2022-05-06 DIAGNOSIS — E440 Moderate protein-calorie malnutrition: Secondary | ICD-10-CM

## 2022-05-06 DIAGNOSIS — G3184 Mild cognitive impairment, so stated: Secondary | ICD-10-CM

## 2022-05-06 NOTE — Progress Notes (Signed)
[facility] Consult Note Telephone: [phone]  Fax: [phone]    Date of encounter: 05/06/22 1:05 PM PATIENT NAME: Joe [name] [id number]   [phone] (home)  DOB: 1946/03/06 MRN: [id number] PRIMARY CARE PROVIDER:    Rica Koyanagi, MD,  [place] [address] [phone]  REFERRING PROVIDER:   Rica Koyanagi, [place],  [place] [address] [phone]  RESPONSIBLE PARTY:    Contact Information     Name Relation Home Work Mobile   shateace,bland Daughter   [phone]   DAVIS,STEPHANIE Daughter [phone]          I met face to face with patient  in [facility]. Palliative Care was asked to follow this patient by consultation request of  Rica Koyanagi, MD to address advance care planning and complex medical decision making. This is a follow up visit.                                   ASSESSMENT AND PLAN / RECOMMENDATIONS:   Advance Care Planning/Goals of Care: Goals include to maximize quality of life and symptom management. Patient/health care surrogate gave his/her permission to discuss.Our advance care planning conversation included a discussion about:    Endorses family support Exploration of personal, cultural or spiritual beliefs that might influence medical decisions  CODE STATUS: DNR  Symptom Management/Plan:  Patient resting in bed today, states he sleeps during the day and stays awake at night. Denies pain or discomfort. Endorses good appetite and has had a slight weight gain. He does appear constipated and I would recommend additional Senna daily. He is on lactulose but occasionally refuses it. This month to date he has refused 8 doses in tid dosing. His ammonia in late June was 111, which was an improvement. He had orders to repeat but that result not on SNF chart. He is additionally on rifaximin.  Albumin 3.5 implying good po intake.  Follow  up Palliative Care Visit: Palliative care will continue to follow for complex medical decision making, advance care planning, and clarification of goals. Return 8 weeks or prn.  I spent 15 minutes providing this consultation. More than 50% of the time in this consultation was spent in counseling and care coordination.  PPS: 30%  HOSPICE ELIGIBILITY/DIAGNOSIS: no  Chief Complaint: debility  HISTORY OF PRESENT ILLNESS:  Joe [name] is a 76 y.o. year old male  with h/o cirrhosis, pancreatitis, debility, immobility. Patient seen today to review palliative care needs to include medical decision making and advance care planning as appropriate.   History obtained from review of EMR, discussion with primary team, and interview with family, facility staff/caregiver and/or Mr. Thon.  I reviewed available labs, medications, imaging, studies and related documents from the EMR.  Records reviewed and summarized above.   ROS  General: NAD ENMT: denies dysphagia Cardiovascular: denies chest pain, denies DOE Pulmonary: denies cough, denies increased SOB Abdomen: endorses good appetite, denies constipation, endorses continence of bowel GU: denies dysuria, endorses continence of urine MSK:  denies  increased weakness,  no falls reported Skin: denies rashes or wounds Neurological: denies pain, endorses  insomnia Psych: Endorses positive mood  Physical Exam: Current and past weights: Constitutional: NAD General: frail appearing, thin EYES: anicteric sclera, lids intact, no discharge  ENMT: intact hearing, oral mucous membranes moist, dentition missing CV: S1S2, RRR, no  LE edema Pulmonary: LCTA, no increased work of breathing, no cough, room air Abdomen: intake 100%, soft and non tender, no ascites MSK: + sarcopenia, moves all extremities, min ambulatory, oob to w/c. Skin: warm and dry, no rashes or wounds on visible skin Neuro:  no increased generalized weakness,  mild  cognitive impairment,  non-anxious affect   Thank you for the opportunity to participate in the care of Mr. Trombly.  The palliative care team will continue to follow. Please call our office at [phone] if we can be of additional assistance.   Jason Coop, NP DNP, AGPCNP-BC  COVID-19 PATIENT SCREENING TOOL Asked and negative response unless otherwise noted:   Have you had symptoms of covid, tested positive or been in contact with someone with symptoms/positive test in the past 5-10 days?

## 2022-07-31 ENCOUNTER — Non-Acute Institutional Stay: Payer: Medicare Other | Admitting: Primary Care

## 2022-07-31 DIAGNOSIS — G3184 Mild cognitive impairment, so stated: Secondary | ICD-10-CM

## 2022-07-31 DIAGNOSIS — Z515 Encounter for palliative care: Secondary | ICD-10-CM

## 2022-07-31 NOTE — Progress Notes (Signed)
[facility] Consult Note Telephone: [phone]  Fax: [phone]    Date of encounter: 07/31/22 12:32 PM PATIENT NAME: Joe [name] [address] [place] [address]   [phone] (home)  DOB: 12-26-1945 MRN: [id number] PRIMARY CARE PROVIDER:    Rosetta Posner, MD,  [address] [place] [address] [phone]  REFERRING PROVIDER:   Rosetta Posner, MD [address] [place],  [place] [address] [phone]  RESPONSIBLE PARTY:    Contact Information     Name Relation Home Work Mobile   shateace,bland Daughter   [phone]   DAVIS,STEPHANIE Daughter [phone]          I met face to face with [facility] in [facility]. Palliative Care was asked to follow this patient by consultation request of  Rosetta Posner, MD to address advance care planning and complex medical decision making. This is a follow up visit.                                   ASSESSMENT AND PLAN / RECOMMENDATIONS:   Advance Care Planning/Goals of Care: Goals include to maximize quality of life and symptom management.  Identification of a healthcare agent - daughter CODE STATUS: DNR  Symptom Management/Plan:  Patient up in room, denies complaints of pain or SOB. Endorses at baseline. Enjoys his private room ( no roommate currently). Has good appetite and no constipation.  Endorses good QoL with current limitations.  Follow up Palliative Care Visit: Palliative care will continue to follow for complex medical decision making, advance care planning, and clarification of goals. Return 8 weeks or prn.  I spent 15 minutes providing this consultation. More than 50% of the time in this consultation was spent in counseling and care coordination.  PPS: 40%  HOSPICE ELIGIBILITY/DIAGNOSIS: TBD  Chief Complaint: debility  HISTORY OF PRESENT ILLNESS:  Joe [name] is a 76 y.o. year old male  with COPD, dementia, debility .   History obtained from review  of EMR, discussion with primary team, and interview with family, facility staff/caregiver and/or Joe [name].  I reviewed available labs, medications, imaging, studies and related documents from the EMR.  Records reviewed and summarized above.   ROS   General: NAD EYES: denies vision changes ENMT: denies dysphagia Cardiovascular: denies chest pain, denies DOE Pulmonary: denies cough, denies increased SOB Abdomen: endorses good appetite, denies constipation, endorses continence of bowel GU: denies dysuria, endorses continence of urine MSK:  denies increased weakness,  no falls reported Skin: denies rashes or wounds Neurological: denies pain, denies insomnia Psych: Endorses positive mood Heme/lymph/immuno: denies bruises, abnormal bleeding  Physical Exam: Current and past weights: 157 lbs Constitutional: NAD General: frail appearing, thin EYES: anicteric sclera, lids intact, no discharge  ENMT: hard of hearing, oral mucous membranes moist CV: no LE edema Pulmonary:  no increased work of breathing, no cough, room air Abdomen: intake 70%,  no ascites GU: deferred MSK: + sarcopenia, moves all extremities, ambulatory with device Skin: warm and dry, no rashes or wounds on visible skin Neuro:  +generalized weakness, + cognitive impairment Psych: non-anxious affect, A and O x 2 Hem/lymph/immuno: no widespread bruising   Thank you for the opportunity to participate in the care of Mr. Larose.  The palliative care team will continue to follow. Please call our office at [phone] if we can be of additional assistance.   Eliezer Lofts, NP  COVID-19 PATIENT SCREENING TOOL Asked and negative response unless otherwise noted:   Have you had symptoms of covid, tested positive or been in contact with someone with symptoms/positive test in the past 5-10 days?

## 2022-10-22 ENCOUNTER — Non-Acute Institutional Stay: Payer: Medicare Other | Admitting: Nurse Practitioner

## 2022-10-22 ENCOUNTER — Encounter: Payer: Self-pay | Admitting: Nurse Practitioner

## 2022-10-22 DIAGNOSIS — E440 Moderate protein-calorie malnutrition: Secondary | ICD-10-CM

## 2022-10-22 DIAGNOSIS — J449 Chronic obstructive pulmonary disease, unspecified: Secondary | ICD-10-CM

## 2022-10-22 DIAGNOSIS — Z515 Encounter for palliative care: Secondary | ICD-10-CM

## 2022-10-22 DIAGNOSIS — R0602 Shortness of breath: Secondary | ICD-10-CM

## 2022-10-22 NOTE — Progress Notes (Signed)
[facility] Consult Note Telephone: [phone]  Fax: [phone]    Date of encounter: 10/22/22 4:04 PM PATIENT NAME: Joe [name] [place] [address]   [phone] (home)  DOB: 01-Oct-1946 MRN: [id number] PRIMARY CARE PROVIDER:    [facility] [place]  RESPONSIBLE PARTY:    Contact Information     Name Relation Home Work Mobile   shateace,bland Daughter   [phone]   DAVIS,STEPHANIE Daughter [phone]        I met face to face with patient in facility. Palliative Care was asked to follow this patient by consultation request of [facility] to address advance care planning and complex medical decision making. This is a follow up visit.                                  ASSESSMENT AND PLAN / RECOMMENDATIONS:  Symptom Management/Plan: 1. Advance Care Planning;  DNR  Will continue to monitor and follow with PC for weights, symptoms currently stable, disease progression  2. Goals of Care: Goals include to maximize quality of life and symptom management. Our advance care planning conversation included a discussion about:    The value and importance of advance care planning  Exploration of personal, cultural or spiritual beliefs that might influence medical decisions  Exploration of goals of care in the event of a sudden injury or illness  Identification and preparation of a healthcare agent  Review and updating or creation of an advance directive document. 3. Palliative care encounter; Palliative care encounter; Palliative medicine team will continue to support patient, patient's family, and medical team. Visit consisted of counseling and education dealing with the complex and emotionally intense issues of symptom management and palliative care in the setting of serious and potentially life-threatening illness  4. Shortness of breath secondary to COPD/malnutrition, stable currently, no O2,  continue current medication regimen, reviewed. Continue to monitor respiratory status, weights, encourage pulmonary toileting as able  07/31/2022 weight 157 lbs 10/19/2022 weight 158.7 lbs Follow up Palliative Care Visit: Palliative care will continue to follow for complex medical decision making, advance care planning, and clarification of goals. Return 4 to 8 weeks or prn.  I spent 46 minutes providing this consultation. More than 50% of the time in this consultation was spent in counseling and care coordination. PPS: 40% Chief Complaint: Follow up palliative consult for complex medical decision making, address goals, manage ongoing symptoms  HISTORY OF PRESENT ILLNESS:  Laszlo Ellerby is a 77 y.o. year old male  with multiple medical problems including COPD, Dementia, alcoholic cirrhosis of liver, chronic pancreatitis, h/o thrombocytopenia, malnutrition, HLD, h/o DVT, HTN. Mr Gibbard resides LTC at [facility]. Mr Ellery requires assistance with transfers, bathing, dressing. Mr Faulcon does feed himself with fair appetite, weight stable, currently 158.7 lbs. Staff endorses no recent falls, wounds, infections, hospitalizations. Now Mr Woon does have a room-mate. At present Mr Mellinger is lying in bed, appears comfortable, denies any complaints or s/s, shortness of breath/pain controlled. No recent exacerbations. Talked about quality of life, importance of appetite, supplements, healthy food choices, oob, activity, socialization. Medical goals reviewed, poc, medications. No new changes recommended today as Mr Raburn is stable, attempted to contact dtg for update, updated staff.   History obtained from review of EMR, discussion with primary team, and interview with family, facility staff/caregiver and/or Mr. Lor.  I reviewed available  labs, medications, imaging, studies and related documents from the EMR.  Records reviewed and summarized above.  Physical Exam: Constitutional: NAD General: frail appearing, thin, pleasant,  debilitated male ENMT: oral mucous membranes moist CV: S1S2, RRR Pulmonary: LCTA Abdomen: normo-active BS + 4 quadrants, soft and non tender Skin: warm and dry Neuro:  no generalized weakness Psych: flat affect, A and Oriented to self Thank you for the opportunity to participate in the care of Mr. Tesch. Please call our office at [phone] if we can be of additional assistance.   Munir [place] Ihor Gully, NP

## 2022-12-01 ENCOUNTER — Encounter: Payer: Self-pay | Admitting: Nurse Practitioner

## 2022-12-01 ENCOUNTER — Non-Acute Institutional Stay: Payer: Medicare Other | Admitting: Nurse Practitioner

## 2022-12-01 DIAGNOSIS — Z515 Encounter for palliative care: Secondary | ICD-10-CM

## 2022-12-01 DIAGNOSIS — J449 Chronic obstructive pulmonary disease, unspecified: Secondary | ICD-10-CM

## 2022-12-01 DIAGNOSIS — R0602 Shortness of breath: Secondary | ICD-10-CM

## 2022-12-01 DIAGNOSIS — R051 Acute cough: Secondary | ICD-10-CM

## 2022-12-01 NOTE — Progress Notes (Signed)
[facility] Consult Note Telephone: [phone]  Fax: [phone]    Date of encounter: 12/01/22 1:27 PM PATIENT NAME: Joe [name] [place] [address]   [phone] (home)  DOB: 10-25-1945 MRN: [id number] PRIMARY CARE PROVIDER:    [facility]  RESPONSIBLE PARTY:    Contact Information     Name Relation Home Work Mobile   Joe [name] Daughter   [phone]   Joe [name] Daughter [phone]       I met face to face with patient in facility. Palliative Care was asked to follow this patient by consultation request of [facility] to address advance care planning and complex medical decision making. This is a follow up visit.                                  ASSESSMENT AND PLAN / RECOMMENDATIONS:  Symptom Management/Plan: 1. Advance Care Planning;  DNR   Will continue to monitor and follow with PC for weights, symptoms currently stable, disease progression   2. Goals of Care: Goals include to maximize quality of life and symptom management. Our advance care planning conversation included a discussion about:    The value and importance of advance care planning  Exploration of personal, cultural or spiritual beliefs that might influence medical decisions  Exploration of goals of care in the event of a sudden injury or illness  Identification and preparation of a healthcare agent  Review and updating or creation of an advance directive document. 3. Palliative care encounter; Palliative care encounter; Palliative medicine team will continue to support patient, patient's family, and medical team. Visit consisted of counseling and education dealing with the complex and emotionally intense issues of symptom management and palliative care in the setting of serious and potentially life-threatening illness   4. Cold symptoms/cough with shortness of breath secondary to COPD/malnutrition, reviewed  weights; no O2, continue current medication regimen, reviewed. Continue to monitor respiratory status, weights, encourage pulmonary toileting as able. Discussed chronic disease progression with Joe [name]. 07/31/2022 weight 157 lbs 10/19/2022 weight 158.7 lbs 11/19/2022 weight 158.6 lbs Follow up Palliative Care Visit: Palliative care will continue to follow for complex medical decision making, advance care planning, and clarification of goals. Return 4 to 8 weeks or prn.   I spent 45 minutes providing this consultation starting at 12:00 pm. More than 50% of the time in this consultation was spent in counseling and care coordination. PPS: 40% Chief Complaint: Follow up palliative consult for complex medical decision making, address goals, manage ongoing symptoms   HISTORY OF PRESENT ILLNESS:  Joe [name] is a 77 y.o. year old male  with multiple medical problems including COPD, Dementia, alcoholic cirrhosis of liver, chronic pancreatitis, h/o thrombocytopenia, malnutrition, HLD, h/o DVT, HTN. Joe [name] resides LTC at [facility]. Joe [name] requires assistance with transfers, bathing, dressing. Joe [name] does feed himself with fair appetite, decreased with current cold symptoms. Family does bring him food to eat. Staff endorses no recent falls, hospitalizations. Joe [name] does have a cough and cold symptoms being managed by primary. Joe [name] at present is lying in bed, appears comfortable, no visitors present. Joe [name] endorses his cough continues to is starting to improve, shared he has cough drops that helps. We talked about overall he has felt okay, ros, functional debility. We talked about appetite, foods he likes. The foods his family brings  into the facility for him. We talked about daily routine, quality of life, "I want to go home". Reassurance, talked about residing facility, coping strategies. Support provided. Joe [name] was cooperative with assessment. Medical goc, poc, medications reviewed. Continue supportive  management of symptoms with primary. Joe [name] remains stable, attempted to contact dtg.    History obtained from review of EMR, discussion with primary team, and interview with family, facility staff/caregiver and/or Joe. [name].  I reviewed available labs, medications, imaging, studies and related documents from the EMR.  Records reviewed and summarized above.  Physical Exam: Constitutional: NAD General: chronically ill, debilitated, pleasant, debilitated male ENMT: oral mucous membranes moist CV: S1S2, RRR Pulmonary: Decreased bases, few wheezes Skin: warm and dry Neuro:  + generalized weakness Psych: flat affect, Alert, engaging Thank you for the opportunity to participate in the care of Joe. [name]. Please call our office at [phone] if we can be of additional assistance.   Joe Handlin Ihor Gully, NP

## 2022-12-31 ENCOUNTER — Non-Acute Institutional Stay: Payer: Medicare Other | Admitting: Nurse Practitioner

## 2022-12-31 DIAGNOSIS — Z515 Encounter for palliative care: Secondary | ICD-10-CM

## 2022-12-31 DIAGNOSIS — E440 Moderate protein-calorie malnutrition: Secondary | ICD-10-CM

## 2022-12-31 DIAGNOSIS — J449 Chronic obstructive pulmonary disease, unspecified: Secondary | ICD-10-CM

## 2022-12-31 DIAGNOSIS — R0602 Shortness of breath: Secondary | ICD-10-CM

## 2022-12-31 NOTE — Progress Notes (Addendum)
[place] Consult Note Telephone: [phone]  Fax: [phone]    Date of encounter: 12/31/22 4:41 PM PATIENT NAME: Elis Simonini [place] [address]   [phone] (home)  DOB: [birth year] MRN: [id number] PRIMARY CARE PROVIDER:    [facility]  RESPONSIBLE PARTY:    Contact Information     Name Relation Home Work Mobile   shateace,bland Daughter   [phone]   DAVIS,STEPHANIE Daughter [phone]        I met face to face with patient in facility. Palliative Care was asked to follow this patient by consultation request of [facility] to address advance care planning and complex medical decision making. This is a follow up visit.                                  ASSESSMENT AND PLAN / RECOMMENDATIONS:  Symptom Management/Plan: 1. Advance Care Planning;  DNR   Will continue to monitor and follow with PC for weights, symptoms currently stable, disease progression   2. Goals of Care: Goals include to maximize quality of life and symptom management. Our advance care planning conversation included a discussion about:    The value and importance of advance care planning  Exploration of personal, cultural or spiritual beliefs that might influence medical decisions  Exploration of goals of care in the event of a sudden injury or illness  Identification and preparation of a healthcare agent  Review and updating or creation of an advance directive document. 3. Palliative care encounter; Palliative care encounter; Palliative medicine team will continue to support patient, patient's family, and medical team. Visit consisted of counseling and education dealing with the complex and emotionally intense issues of symptom management and palliative care in the setting of serious and potentially life-threatening illness   4. shortness of breath secondary to COPD/malnutrition, stable, reviewed weights; no O2, continue  current medication regimen, reviewed. Continue to monitor respiratory status, weights, encourage pulmonary toileting as able. Discussed chronic disease progression with Mr Mccoin. 07/31/2022 weight 157 lbs 10/19/2022 weight 158.7 lbs 11/19/2022 weight 158.6 lbs Follow up Palliative Care Visit: Palliative care will continue to follow for complex medical decision making, advance care planning, and clarification of goals. Return 4 to 8 weeks or prn.   I spent 45 minutes providing this consultation starting at 12:00 pm. More than 50% of the time in this consultation was spent in counseling and care coordination. PPS: 40% Chief Complaint: Follow up palliative consult for complex medical decision making, address goals, manage ongoing symptoms   HISTORY OF PRESENT ILLNESS:  Adali Calamia is a 77 y.o. year old male  with multiple medical problems including COPD, Dementia, alcoholic cirrhosis of liver, chronic pancreatitis, h/o thrombocytopenia, malnutrition, HLD, h/o DVT, HTN. Mr Ata resides LTC at [facility]. Mr Ciaramella requires assistance with transfers, bathing, dressing. Mr Reichl does feed himself with fair appetite, decreased. Staff endorses no recent falls, hospitalizations. Mr Lascelles does have a cough and cold symptoms being managed by primary. Mr Freedland at present is sitting up in bed, looking out the window, he was making comments about his [place]. Mr Kralik and I talked about how he was feeling, ros, appetite, daily routine, what he likes to do. Limited with cognitive impairment. We talked about his family. Most pc visit Supportive though limited. Mr Schriever was cooperative and appeared to enjoy conversation. Medications, poc,  goc reviewed. Will continue current plan. Attempted to contact dtg, Medications, goc, poc reviewed.    History obtained from review of EMR, discussion with primary team, and interview with family, facility staff/caregiver and/or Mr. Calendine.  I reviewed available labs, medications,  imaging, studies and related documents from the EMR.  Records reviewed and summarized above.  Physical Exam: General: chronically ill, debilitated, pleasant, debilitated male ENMT: oral mucous membranes moist CV: S1S2, RRR Pulmonary: Decreased bases, few wheezes Neuro:  + generalized weakness Psych: smiling, interactive Thank you for the opportunity to participate in the care of Mr. Linarez. Please call our office at [phone] if we can be of additional assistance.   Alfreda Hammad Ihor Gully, NP

## 2023-02-22 ENCOUNTER — Non-Acute Institutional Stay: Payer: Medicare Other | Admitting: Nurse Practitioner

## 2023-02-22 ENCOUNTER — Encounter: Payer: Self-pay | Admitting: Nurse Practitioner

## 2023-02-22 DIAGNOSIS — G3184 Mild cognitive impairment, so stated: Secondary | ICD-10-CM

## 2023-02-22 DIAGNOSIS — R0602 Shortness of breath: Secondary | ICD-10-CM

## 2023-02-22 DIAGNOSIS — Z515 Encounter for palliative care: Secondary | ICD-10-CM

## 2023-02-22 DIAGNOSIS — J449 Chronic obstructive pulmonary disease, unspecified: Secondary | ICD-10-CM

## 2023-02-22 DIAGNOSIS — E440 Moderate protein-calorie malnutrition: Secondary | ICD-10-CM

## 2023-02-22 NOTE — Progress Notes (Signed)
Therapist, nutritional Palliative Care Consult Note Telephone: [phone]  Fax: [phone]    Date of encounter: 02/22/23 8:52 PM PATIENT NAME: Joe [name] [address] [place] [address]   [phone] (home)  DOB: [birth year] MRN: [id number] PRIMARY CARE PROVIDER:    [facility]  RESPONSIBLE PARTY:    Contact Information     Name Relation Home Work Mobile   shateace,bland Daughter   [phone]   DAVIS,STEPHANIE Daughter [phone]        I met face to face with patient in facility. Palliative Care was asked to follow this patient by consultation request of [facility] to address advance care planning and complex medical decision making. This is a follow up visit.                                  ASSESSMENT AND PLAN / RECOMMENDATIONS:  Symptom Management/Plan: 1. Advance Care Planning;  DNR   2. Palliative care encounter; Palliative care encounter; Palliative medicine team will continue to support patient, patient's family, and medical team. Visit consisted of counseling and education dealing with the complex and emotionally intense issues of symptom management and palliative care in the setting of serious and potentially life-threatening illness   3. shortness of breath secondary to COPD/malnutrition,weightloss, reviewed weights; no O2, continue current medication regimen, reviewed. Continue to monitor respiratory status, weights, encourage pulmonary toileting as able.  Encourage nutrition, supplement 07/31/2022 weight 157 lbs 10/19/2022 weight 158.7 lbs 11/19/2022 weight 158.6 lbs 02/12/2023 weight 152.2 lb 6.4 lbs/2 months loss; will continue to monitor, Follow up Palliative Care Visit: Palliative care will continue to follow for complex medical decision making, advance care planning, and clarification of goals. Return 4 to 8 weeks or prn.   I spent 47 minutes providing this consultation. More than 50% of the time in this  consultation was spent in counseling and care coordination. PPS: 40% Chief Complaint: Follow up palliative consult for complex medical decision making, address goals, manage ongoing symptoms   HISTORY OF PRESENT ILLNESS:  Joe [name] is a 77 y.o. year old male  with multiple medical problems including COPD, Dementia, alcoholic cirrhosis of liver, chronic pancreatitis, h/o thrombocytopenia, malnutrition, HLD, h/o DVT, HTN. Joe [name] resides LTC at [facility]. Joe [name] requires assistance with transfers, bathing, dressing. Joe [name] does feed himself with fair appetite, fair. No recent falls, wounds, hospitalizations, infections per staff. At present Joe [name] is sitting on the side of the bed, looking out the window. Joe [name] appears comfortable. No visitors present. Joe [name] makes eye contact. Joe [name] was able to answer simple questions, denies pain or sob. Joe [name] endorses he will "sit" today. We talked about food, he was engaging about food he likes. Joe [name] did answer questions about family though limited with cognitive impairment. Most pc visit Supportive though limited. Joe [name] was cooperative and appeared to enjoy conversation. Medications, poc, goc reviewed. Will continue current plan. Attempted to contact dtg, Updated staff   History obtained from review of EMR, discussion with primary team, and interview with family, facility staff/caregiver and/or Joe. [name].  I reviewed available labs, medications, imaging, studies and related documents from the EMR.  Records reviewed and summarized above.  Physical Exam: General: pleasant male ENMT: oral mucous membranes moist CV: S1S2, RRR Pulmonary: breath sounds clear Neuro:  + generalized weakness Psych: smiling, interactive  Thank you for the  opportunity to participate in the care of Joe. [name]. Please call our office at [phone] if we can be of additional assistance.   Linsey Hirota Prince [place], NP

## 2023-03-08 ENCOUNTER — Non-Acute Institutional Stay: Payer: Medicare Other | Admitting: Nurse Practitioner

## 2023-03-08 ENCOUNTER — Encounter: Payer: Self-pay | Admitting: Nurse Practitioner

## 2023-03-08 DIAGNOSIS — Z515 Encounter for palliative care: Secondary | ICD-10-CM

## 2023-03-08 DIAGNOSIS — J449 Chronic obstructive pulmonary disease, unspecified: Secondary | ICD-10-CM

## 2023-03-08 DIAGNOSIS — R0602 Shortness of breath: Secondary | ICD-10-CM

## 2023-03-08 DIAGNOSIS — E440 Moderate protein-calorie malnutrition: Secondary | ICD-10-CM

## 2023-03-08 NOTE — Progress Notes (Signed)
Therapist, nutritional Palliative Care Consult Note Telephone: [phone]  Fax: [phone]    Date of encounter: 03/08/23 9:23 PM PATIENT NAME: Joe [name] [address] [place] [address]   [phone] (home)  DOB: [birth year] MRN: [id number] PRIMARY CARE PROVIDER:    [facility]  RESPONSIBLE PARTY:    Contact Information     Name Relation Home Work Mobile   shateace,bland Daughter   [phone]   DAVIS,STEPHANIE Daughter [phone]       I met face to face with patient in facility. Palliative Care was asked to follow this patient by consultation request of [facility] to address advance care planning and complex medical decision making. This is a follow up visit.                                  ASSESSMENT AND PLAN / RECOMMENDATIONS:  Symptom Management/Plan: 1. Advance Care Planning;  DNR   2. Palliative care encounter; Palliative care encounter; Palliative medicine team will continue to support patient, patient's family, and medical team. Visit consisted of counseling and education dealing with the complex and emotionally intense issues of symptom management and palliative care in the setting of serious and potentially life-threatening illness   3. Shortness of breath secondary to COPD/malnutrition,weightloss, reviewed weights; no O2, continue current medication regimen, reviewed. Continue to monitor respiratory status, weights, encourage pulmonary toileting as able.  Encourage nutrition, supplement 07/31/2022 weight 157 lbs 10/19/2022 weight 158.7 lbs 11/19/2022 weight 158.6 lbs 02/12/2023 weight 152.2 lb Waiting for a current weight 6.4 lbs/2 months loss; will continue to monitor, Follow up Palliative Care Visit: Palliative care will continue to follow for complex medical decision making, advance care planning, and clarification of goals. Return 2 to 8 weeks or prn.   I spent 45 minutes providing this consultation. More than 50%  of the time in this consultation was spent in counseling and care coordination. PPS: 40% Chief Complaint: Follow up palliative consult for complex medical decision making, address goals, manage ongoing symptoms   HISTORY OF PRESENT ILLNESS:  Joe [name] is a 77 y.o. year old male  with multiple medical problems including COPD, Dementia, alcoholic cirrhosis of liver, chronic pancreatitis, h/o thrombocytopenia, malnutrition, HLD, h/o DVT, HTN. Joe [name] resides LTC at [facility]. Joe [name] requires assistance with transfers, bathing, dressing. Joe [name] does feed himself with fair appetite, fair. No recent falls, wounds, hospitalizations, infections per staff. At present Joe [name] is sitting in the w/c in his room. Purpose of today visit was requested by [facility] NP due to decline. At present Joe [name] is sitting in the w/c in his room, appears debilitated, comfortable. Joe [name] does make eye contact   Most pc visit Supportive though limited. Joe [name] was cooperative and appeared to enjoy conversation. Medications, poc, goc reviewed. Will continue current plan. Attempted to contact dtg, Updated staff   History obtained from review of EMR, discussion with primary team, and interview with family, facility staff/caregiver and/or Joe. [name].  I reviewed available labs, medications, imaging, studies and related documents from the EMR.  Records reviewed and summarized above.  Physical Exam: General: pleasant male ENMT: oral mucous membranes moist CV: S1S2, RRR Pulmonary: breath sounds clear Neuro:  + generalized weakness Psych: smiling, interactive Thank you for the opportunity to participate in the care of Joe. [name]. Please call our office at [phone] if we can be of  additional assistance.   Onie Kasparek Prince [place], NP

## 2023-03-11 ENCOUNTER — Non-Acute Institutional Stay: Payer: Medicare Other | Admitting: Nurse Practitioner

## 2023-03-11 ENCOUNTER — Encounter: Payer: Self-pay | Admitting: Nurse Practitioner

## 2023-03-11 DIAGNOSIS — Z515 Encounter for palliative care: Secondary | ICD-10-CM

## 2023-03-11 DIAGNOSIS — J449 Chronic obstructive pulmonary disease, unspecified: Secondary | ICD-10-CM

## 2023-03-11 DIAGNOSIS — R0602 Shortness of breath: Secondary | ICD-10-CM

## 2023-03-11 DIAGNOSIS — E440 Moderate protein-calorie malnutrition: Secondary | ICD-10-CM

## 2023-03-11 NOTE — Progress Notes (Addendum)
Therapist, nutritional Palliative Care Consult Note Telephone: [phone]  Fax: [phone]    Date of encounter: 03/11/23 9:58 PM PATIENT NAME: Joe [name] [address] [place] [address]   [phone] (home)  DOB: 1945/12/12 MRN: [id number] PRIMARY CARE PROVIDER:    [facility]  RESPONSIBLE PARTY:    Contact Information     Name Relation Home Work Mobile   shateace,bland Daughter   [phone]   DAVIS,STEPHANIE Daughter [phone]       I met face to face with patient in facility. Palliative Care was asked to follow this patient by consultation request of [facility] to address advance care planning and complex medical decision making. This is a follow up visit.                                  ASSESSMENT AND PLAN / RECOMMENDATIONS:  Symptom Management/Plan: 1. Advance Care Planning;  DNR   2. Palliative care encounter; Palliative care encounter; Palliative medicine team will continue to support patient, patient's family, and medical team. Visit consisted of counseling and education dealing with the complex and emotionally intense issues of symptom management and palliative care in the setting of serious and potentially life-threatening illness   3. Shortness of breath secondary to COPD/malnutrition,weightloss, reviewed weights; no O2, continue current medication regimen, reviewed. Continue to monitor respiratory status, weights, encourage pulmonary toileting as able.  Encourage nutrition, supplement 07/31/2022 weight 157 lbs 10/19/2022 weight 158.7 lbs 11/19/2022 weight 158.6 lbs 02/12/2023 weight 152.2 lb Waiting for a current weight 6.4 lbs/2 months loss; will continue to monitor, Follow up Palliative Care Visit: Palliative care will continue to follow for complex medical decision making, advance care planning, and clarification of goals. Return 2 to 8 weeks or prn.   I spent 46 minutes providing this consultation. More than 50%  of the time in this consultation was spent in counseling and care coordination. PPS: 40% Chief Complaint: Follow up palliative consult for complex medical decision making, address goals, manage ongoing symptoms   HISTORY OF PRESENT ILLNESS:  Joe [name] is a 77 y.o. year old male  with multiple medical problems including COPD, Dementia, alcoholic cirrhosis of liver, chronic pancreatitis, h/o thrombocytopenia, malnutrition, HLD, h/o DVT, HTN. Joe [name] resides LTC at [facility]. Joe [name] requires assistance with transfers, bathing, dressing. Joe [name] does feed himself with fair appetite, fair. No recent falls, wounds, hospitalizations, infections per staff. At present Joe [name] is sitting on the side of his bed, confused. Joe [name] did make eye contact though limited with cognitive impairment. Joe [name] was cooperative with assessment, declines food tray "I can't eat anymore" Continue to encourage to eat, snacks, supplements, reviewed weights. Requested primary/[facility] and last weight/labs from [facility]. Most pc visit Supportive though limited. Joe Flight was cooperative and appeared to enjoy conversation. Medications, poc, goc reviewed. Will continue current plan. Attempted to contact dtg, Updated staff   History obtained from review of EMR, discussion with primary team, and interview with family, facility staff/caregiver and/or Joe. [name].  I reviewed available labs, medications, imaging, studies and related documents from the EMR.  Records reviewed and summarized above.  Physical Exam: General: pleasant male ENMT: oral mucous membranes moist CV: S1S2, RRR Pulmonary: breath sounds clear Neuro:  + generalized weakness Psych: smiling, interactive Thank you for the opportunity to participate in the care of Joe. [name]. Please call our office at  [phone] if we can be of additional assistance.   Mayuri Staples Prince [place], NP

## 2023-04-01 ENCOUNTER — Emergency Department: Payer: Medicare Other

## 2023-04-01 ENCOUNTER — Other Ambulatory Visit: Payer: Self-pay

## 2023-04-01 ENCOUNTER — Inpatient Hospital Stay
Admission: EM | Admit: 2023-04-01 | Discharge: 2023-04-05 | DRG: 521 | Disposition: A | Discharge status: Skilled Nursing Facility | Payer: Medicare Other | Source: Skilled Nursing Facility | Attending: Internal Medicine | Admitting: Internal Medicine

## 2023-04-01 ENCOUNTER — Encounter: Payer: Self-pay | Admitting: Family Medicine

## 2023-04-01 DIAGNOSIS — F32A Depression, unspecified: Secondary | ICD-10-CM | POA: Diagnosis present

## 2023-04-01 DIAGNOSIS — I10 Essential (primary) hypertension: Secondary | ICD-10-CM | POA: Diagnosis present

## 2023-04-01 DIAGNOSIS — Z885 Allergy status to narcotic agent status: Secondary | ICD-10-CM | POA: Diagnosis not present

## 2023-04-01 DIAGNOSIS — Z8249 Family history of ischemic heart disease and other diseases of the circulatory system: Secondary | ICD-10-CM | POA: Diagnosis not present

## 2023-04-01 DIAGNOSIS — Z888 Allergy status to other drugs, medicaments and biological substances status: Secondary | ICD-10-CM | POA: Diagnosis not present

## 2023-04-01 DIAGNOSIS — K861 Other chronic pancreatitis: Secondary | ICD-10-CM | POA: Diagnosis present

## 2023-04-01 DIAGNOSIS — W1830XA Fall on same level, unspecified, initial encounter: Secondary | ICD-10-CM | POA: Diagnosis present

## 2023-04-01 DIAGNOSIS — K219 Gastro-esophageal reflux disease without esophagitis: Secondary | ICD-10-CM | POA: Diagnosis present

## 2023-04-01 DIAGNOSIS — J449 Chronic obstructive pulmonary disease, unspecified: Secondary | ICD-10-CM | POA: Diagnosis present

## 2023-04-01 DIAGNOSIS — Z79890 Hormone replacement therapy (postmenopausal): Secondary | ICD-10-CM | POA: Diagnosis not present

## 2023-04-01 DIAGNOSIS — Z808 Family history of malignant neoplasm of other organs or systems: Secondary | ICD-10-CM | POA: Diagnosis not present

## 2023-04-01 DIAGNOSIS — Z86718 Personal history of other venous thrombosis and embolism: Secondary | ICD-10-CM

## 2023-04-01 DIAGNOSIS — K860 Alcohol-induced chronic pancreatitis: Secondary | ICD-10-CM | POA: Diagnosis not present

## 2023-04-01 DIAGNOSIS — I81 Portal vein thrombosis: Secondary | ICD-10-CM | POA: Diagnosis present

## 2023-04-01 DIAGNOSIS — G309 Alzheimer's disease, unspecified: Secondary | ICD-10-CM | POA: Diagnosis present

## 2023-04-01 DIAGNOSIS — Z87891 Personal history of nicotine dependence: Secondary | ICD-10-CM | POA: Diagnosis not present

## 2023-04-01 DIAGNOSIS — F0283 Dementia in other diseases classified elsewhere, unspecified severity, with mood disturbance: Secondary | ICD-10-CM | POA: Diagnosis present

## 2023-04-01 DIAGNOSIS — Z86711 Personal history of pulmonary embolism: Secondary | ICD-10-CM

## 2023-04-01 DIAGNOSIS — R296 Repeated falls: Secondary | ICD-10-CM | POA: Diagnosis present

## 2023-04-01 DIAGNOSIS — S72001D Fracture of unspecified part of neck of right femur, subsequent encounter for closed fracture with routine healing: Secondary | ICD-10-CM | POA: Diagnosis not present

## 2023-04-01 DIAGNOSIS — S72001A Fracture of unspecified part of neck of right femur, initial encounter for closed fracture: Principal | ICD-10-CM | POA: Diagnosis present

## 2023-04-01 DIAGNOSIS — Z79899 Other long term (current) drug therapy: Secondary | ICD-10-CM | POA: Diagnosis not present

## 2023-04-01 DIAGNOSIS — D696 Thrombocytopenia, unspecified: Secondary | ICD-10-CM | POA: Diagnosis present

## 2023-04-01 DIAGNOSIS — Z8673 Personal history of transient ischemic attack (TIA), and cerebral infarction without residual deficits: Secondary | ICD-10-CM

## 2023-04-01 DIAGNOSIS — Z90411 Acquired partial absence of pancreas: Secondary | ICD-10-CM | POA: Diagnosis not present

## 2023-04-01 DIAGNOSIS — Z7901 Long term (current) use of anticoagulants: Secondary | ICD-10-CM | POA: Diagnosis not present

## 2023-04-01 DIAGNOSIS — Z66 Do not resuscitate: Secondary | ICD-10-CM | POA: Diagnosis present

## 2023-04-01 DIAGNOSIS — D62 Acute posthemorrhagic anemia: Secondary | ICD-10-CM | POA: Diagnosis not present

## 2023-04-01 DIAGNOSIS — K709 Alcoholic liver disease, unspecified: Secondary | ICD-10-CM | POA: Diagnosis present

## 2023-04-01 DIAGNOSIS — W19XXXA Unspecified fall, initial encounter: Principal | ICD-10-CM

## 2023-04-01 DIAGNOSIS — Y92129 Unspecified place in nursing home as the place of occurrence of the external cause: Secondary | ICD-10-CM

## 2023-04-01 LAB — COMPREHENSIVE METABOLIC PANEL
ALT: 15 U/L (ref 0–44)
AST: 38 U/L (ref 15–41)
Albumin: 3 g/dL — ABNORMAL LOW (ref 3.5–5.0)
Alkaline Phosphatase: 65 U/L (ref 38–126)
Anion gap: 9 (ref 5–15)
BUN: 12 mg/dL (ref 8–23)
CO2: 20 mmol/L — ABNORMAL LOW (ref 22–32)
Calcium: 8.5 mg/dL — ABNORMAL LOW (ref 8.9–10.3)
Chloride: 109 mmol/L (ref 98–111)
Creatinine, Ser: 0.59 mg/dL — ABNORMAL LOW (ref 0.61–1.24)
GFR, Estimated: >60 mL/min (ref >60)
Glucose, Bld: 91 mg/dL (ref 70–99)
Potassium: 4.1 mmol/L (ref 3.5–5.1)
Sodium: 138 mmol/L (ref 135–145)
Total Bilirubin: 2.5 mg/dL — ABNORMAL HIGH (ref 0.3–1.2)
Total Protein: 6.3 g/dL — ABNORMAL LOW (ref 6.5–8.1)

## 2023-04-01 LAB — PROTIME-INR
INR: 1.4 — ABNORMAL HIGH (ref 0.8–1.2)
Prothrombin Time: 17 seconds — ABNORMAL HIGH (ref 11.4–15.2)

## 2023-04-01 LAB — CBC
HCT: 34.2 % — ABNORMAL LOW (ref 39.0–52.0)
Hemoglobin: 11.2 g/dL — ABNORMAL LOW (ref 13.0–17.0)
MCH: 28.4 pg (ref 26.0–34.0)
MCHC: 32.7 g/dL (ref 30.0–36.0)
MCV: 86.6 fL (ref 80.0–100.0)
Platelets: 95 10*3/uL — ABNORMAL LOW (ref 150–400)
RBC: 3.95 MIL/uL — ABNORMAL LOW (ref 4.22–5.81)
RDW: 15.2 % (ref 11.5–15.5)
WBC: 5.9 10*3/uL (ref 4.0–10.5)
nRBC: 0 % (ref 0.0–0.2)

## 2023-04-01 LAB — APTT: aPTT: 34 seconds (ref 24–36)

## 2023-04-01 MED ORDER — FENTANYL CITRATE PF 50 MCG/ML IJ SOSY
25.0000 ug | PREFILLED_SYRINGE | INTRAMUSCULAR | Status: DC | PRN
  Administered 2023-04-01 – 2023-04-02 (×2): 25 ug via INTRAVENOUS
  Filled 2023-04-01 (×2): qty 1

## 2023-04-01 MED ORDER — FENTANYL CITRATE PF 50 MCG/ML IJ SOSY
50.0000 ug | PREFILLED_SYRINGE | Freq: Once | INTRAMUSCULAR | Status: AC
  Administered 2023-04-01: 50 ug via INTRAVENOUS
  Filled 2023-04-01: qty 1

## 2023-04-01 MED ORDER — TRAZODONE HCL 50 MG PO TABS: 25.0000 mg | ORAL_TABLET | Freq: Every evening | ORAL | Status: DC | PRN

## 2023-04-01 MED ORDER — SODIUM CHLORIDE 0.9 % IV SOLN: INTRAVENOUS | Status: DC

## 2023-04-01 MED ORDER — CEFAZOLIN SODIUM-DEXTROSE 2-4 GM/100ML-% IV SOLN
2.0000 g | INTRAVENOUS | Status: AC
  Administered 2023-04-02: 2 g via INTRAVENOUS
  Filled 2023-04-01: qty 100

## 2023-04-01 MED ORDER — HYDRALAZINE HCL 20 MG/ML IJ SOLN
10.0000 mg | Freq: Once | INTRAMUSCULAR | Status: AC
  Administered 2023-04-01: 10 mg via INTRAVENOUS
  Filled 2023-04-01: qty 1

## 2023-04-01 MED ORDER — ACETAMINOPHEN 325 MG PO TABS: 650.0000 mg | ORAL_TABLET | ORAL | Status: DC | PRN

## 2023-04-01 MED ORDER — HYDRALAZINE HCL 20 MG/ML IJ SOLN: 10.0000 mg | Freq: Four times a day (QID) | INTRAMUSCULAR | Status: DC | PRN

## 2023-04-01 MED ORDER — ONDANSETRON HCL 4 MG/2ML IJ SOLN: 4.0000 mg | INTRAMUSCULAR | Status: DC | PRN

## 2023-04-01 NOTE — Consult Note (Signed)
ORTHOPAEDIC CONSULTATION  REQUESTING PHYSICIAN: [name], MD  Chief Complaint:   R hip pain  History of Present Illness: History obtained from review of chart, patient, patient's daughter (medical PoA) at bedside, and discussion with medical providers.   Joe [name] is a 77 y.o. male who had a fall earlier today.  The patient noted immediate hip pain and inability to ambulate.  The patient ambulates with a walker at baseline. He has had multiple falls in the past few weeks due to unknown etiology. The patient lives at [facility].  Pain is worse with any sort of movement.  X-rays in the emergency department show a right displaced femoral neck fracture.  His medical history is significant for alcoholic liver disease, chronic pancreatitis, COPD, portal vein thrombosis and PE as well as DVT of the right lower extremity on Xarelto, GERD, and hypertension. Per daughter, he also has early dementia and depending on the day will not recall recent events. Last reported xaralto dose was yesterday AM. He has had prior Whipple surgery.   Past Medical History:  Diagnosis Date   Alcoholic liver disease (HCC)    Cerebral infarction (HCC)    Chronic pancreatitis (HCC)    Depression    DVT, lower extremity (HCC)    Dysphagia, oropharyngeal phase    GERD (gastroesophageal reflux disease)    Hypertension    Iron deficiency anemia    Muscle weakness (generalized)    Pancreatitis    Patient denies medical problems    Personal history of transient ischemic attack    Thrombocytopenia, unspecified (HCC)    Vitamin D deficiency, unspecified    Past Surgical History:  Procedure Laterality Date   WHIPPLE PROCEDURE     Social History   Socioeconomic History   Marital status: Single    Spouse name: Not on file   Number of children: Not on file   Years of education: Not on file   Highest education level: Not on file   Occupational History   Not on file  Tobacco Use   Smoking status: Former    Packs/day: 0.50    Years: 50.00    Additional pack years: 0.00    Total pack years: 25.00    Types: Cigarettes    Quit date: 02/16/2017    Years since quitting: 6.1   Smokeless tobacco: Current  Substance and Sexual Activity   Alcohol use: Not Currently   Drug use: Not Currently   Sexual activity: Not on file  Other Topics Concern   Not on file  Social History Narrative   Not on file   Social Determinants of Health   Financial Resource Strain: Not on file  Food Insecurity: Not on file  Transportation Needs: Not on file  Physical Activity: Not on file  Stress: Not on file  Social Connections: Not on file   Family History  Problem Relation Age of Onset   Brain cancer Mother    Heart disease Brother    Allergies  Allergen Reactions   Morphine Hives   Heparin     HIT   Morphine And Codeine Hives and Itching   Oxycodone-Acetaminophen Other (See Comments)    Altered mental status, goes "crazy" per family   Prior to Admission medications   Medication Sig Start Date End Date Taking? Authorizing Provider  amLODipine (NORVASC) 2.5 MG tablet Take 2.5 mg by mouth daily. 04/29/21   [provider]  carvedilol (COREG) 3.125 MG tablet Take 3.125 mg by mouth 2 (two) times  daily. 05/26/21   [provider]  Cholecalciferol 1.25 MG (50000 UT) TABS Take by mouth.    [provider]  lactulose (CEPHULAC) 10 g packet Take 10 g by mouth 3 (three) times daily.    [provider]  lipase/protease/amylase (CREON) 36000 UNITS CPEP capsule Take 72,000 Units by mouth 3 (three) times daily with meals.    [provider]  melatonin 3 MG TABS tablet Take 3 mg by mouth at bedtime.    [provider]  Multiple Vitamin (MULTIVITAMIN WITH MINERALS) TABS tablet Take 1 tablet by mouth daily. 03/29/18   Enedina Finner, MD  oxyCODONE (ROXICODONE) 5 MG immediate release tablet Take  1 tablet (5 mg total) by mouth every 6 (six) hours as needed for severe pain. 12/20/18   Rockne Menghini, MD  pantoprazole (PROTONIX) 40 MG tablet Take 1 tablet (40 mg total) by mouth daily. 03/29/18   Enedina Finner, MD  XARELTO 20 MG TABS tablet Take 20 mg by mouth daily. 05/29/21   [provider]  XIFAXAN 550 MG TABS tablet Take 550 mg by mouth 2 (two) times daily. 05/26/21   [provider]   No results for input(s): "WBC", "HGB", "HCT", "PLT", "K", "CL", "CO2", "BUN", "CREATININE", "GLUCOSE", "CALCIUM", "LABPT", "INR" in the last 72 hours. DG Chest 1 View  Result Date: 04/01/2023 CLINICAL DATA:  Status post fall. EXAM: CHEST  1 VIEW COMPARISON:  March 26, 2018 FINDINGS: The heart size and mediastinal contours are within normal limits. Both lungs are clear. The visualized skeletal structures are unremarkable. IMPRESSION: No active disease. Electronically Signed   By: Aram Candela M.D.   On: 04/01/2023 18:57   DG HIP UNILAT WITH PELVIS 2-3 VIEWS RIGHT  Result Date: 04/01/2023 CLINICAL DATA:  Status post fall. EXAM: DG HIP (WITH OR WITHOUT PELVIS) 2-3V RIGHT COMPARISON:  None Available. FINDINGS: There is an acute fracture deformity extending through the neck of the proximal right femur. There is no evidence of dislocation. Mild degenerative changes are seen in the form of joint space narrowing and acetabular sclerosis. IMPRESSION: Acute fracture of the proximal right femur. Electronically Signed   By: Aram Candela M.D.   On: 04/01/2023 18:54   CT HEAD WO CONTRAST ( )  Result Date: 04/01/2023 CLINICAL DATA:  Head trauma, minor (Age >= 65y); Neck trauma (Age >= 65y) EXAM: CT HEAD WITHOUT CONTRAST CT CERVICAL SPINE WITHOUT CONTRAST TECHNIQUE: Multidetector CT imaging of the head and cervical spine was performed following the standard protocol without intravenous contrast. Multiplanar CT image reconstructions of the cervical spine were also generated. RADIATION DOSE  REDUCTION: This exam was performed according to the departmental dose-optimization program which includes automated exposure control, adjustment of the mA and/or kV according to patient size and/or use of iterative reconstruction technique. COMPARISON:  None Available. FINDINGS: CT HEAD FINDINGS Brain: No evidence of acute infarction, hemorrhage, hydrocephalus, extra-axial collection or mass lesion/mass effect. Moderate to severe chronic microvascular ischemic change with generalized volume loss. Vascular: No hyperdense vessel or unexpected calcification. Skull: Normal. Negative for fracture or focal lesion. Sinuses/Orbits: No middle ear or mastoid effusion. Paranasal sinuses are clear. Orbits are unremarkable. Other: None. CT CERVICAL SPINE FINDINGS Alignment: Straightening of the normal cervical lordosis. Trace retrolisthesis of C3 on C4 and C4 on C5. Skull base and vertebrae: No acute fracture. No primary bone lesion or focal pathologic process. Soft tissues and spinal canal: No prevertebral fluid or swelling. No visible canal hematoma. 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 Other: Asymmetric medialization of the right vocal cord, nonspecific. Correlate for symptoms of dysphonia. IMPRESSION: 1. No acute intracranial abnormality. 2. No acute fracture or traumatic subluxation of the cervical spine. 3. 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 4. Asymmetric medialization of the right vocal cord, nonspecific. Correlate for symptoms of dysphonia. Electronically Signed   By: Lorenza Cambridge M.D.   On: 04/01/2023 18:31   CT Cervical Spine Wo Contrast  Result Date: 04/01/2023 CLINICAL DATA:  Head trauma, minor (Age >= 65y); Neck trauma (Age  >= 65y) EXAM: CT HEAD WITHOUT CONTRAST CT CERVICAL SPINE WITHOUT CONTRAST TECHNIQUE: Multidetector CT imaging of the head and cervical spine was performed following the standard protocol without intravenous contrast. Multiplanar CT image reconstructions of the cervical spine were also generated. RADIATION DOSE REDUCTION: This exam was performed according to the departmental dose-optimization program which includes automated exposure control, adjustment of the mA and/or kV according to patient size and/or use of iterative reconstruction technique. COMPARISON:  None Available. FINDINGS: CT HEAD FINDINGS Brain: No evidence of acute infarction, hemorrhage, hydrocephalus, extra-axial collection or mass lesion/mass effect. Moderate to severe chronic microvascular ischemic change with generalized volume loss. Vascular: No hyperdense vessel or unexpected calcification. Skull: Normal. Negative for fracture or focal lesion. Sinuses/Orbits: No middle ear or mastoid effusion. Paranasal sinuses are clear. Orbits are unremarkable. Other: None. CT CERVICAL SPINE FINDINGS Alignment: Straightening of the normal cervical lordosis. Trace retrolisthesis of C3 on C4 and C4 on C5. Skull base and vertebrae: No acute fracture. No primary bone lesion or focal pathologic process. Soft tissues and spinal canal: No prevertebral fluid or swelling. No visible canal hematoma. 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 Other: Asymmetric medialization of the right vocal cord, nonspecific. Correlate for symptoms of dysphonia. IMPRESSION: 1. No acute intracranial abnormality. 2. No acute fracture or traumatic subluxation of the cervical spine. 3. 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 4. Asymmetric medialization of the right vocal cord, nonspecific. Correlate for symptoms of dysphonia. Electronically Signed   By: Lorenza Cambridge M.D.   On: 04/01/2023 18:31     Positive ROS: All other systems have been reviewed and were otherwise negative with the exception of those mentioned in the HPI and as above.  Physical Exam: BP (!) 187/94   Pulse 77   Temp 98.1 F (36.7 C) (Oral)   Resp 20   Ht 5\' 9"  (1.753 m)   Wt 69.2 kg   SpO2 100%   BMI 22.52 kg/m  General:  Alert, no acute distress  Orthopedic Exam:  RLE: + DF/PF/EHL SILT grossly over foot Foot wwp +Log roll/axial load Leg shortened and externally rotated   X-rays:  As above: R displaced femoral neck fracture  Assessment/Plan: Walther Graetz is a 77 y.o. male with a R displaced femoral neck fracture   1. I discussed the various treatment options including both surgical and non-surgical management of the fracture with the patient and/or family (medical PoA). We discussed the high risk of perioperative complications due to patient's age, dementia, and other co-morbidities. After discussion of risks, benefits, and alternatives to  surgery, the family and/or patient were in agreement to proceed with surgery. The goals of surgery would be to provide adequate pain relief and allow for mobilization. Plan for surgery is R hip hemiarthroplasty tomorrow, 04/02/23. Consent obtained from patient's daughter at bedside.  2. NPO after midnight 3. Hold anticoagulation in advance of OR 4. Admit to Hospitalist service   Signa Kell   04/01/2023 7:24 PM

## 2023-04-01 NOTE — Assessment & Plan Note (Signed)
-   continue his Creon. 

## 2023-04-01 NOTE — Assessment & Plan Note (Addendum)
-   The patient will be admitted to the surgical telemetry bed. - Pain management will be provided. - His Xarelto will be held off. - Orthopedic consultation was obtained by Dr. Allena Katz in the ED. - The patient will be kept n.p.o. after midnight, for possible ORIF in AM. - He has no history of CVA, CAD, CHF, diabetes mellitus on insulin or renal failure with a creatinine more than 2.  He is considered at average risk for his age for perioperative cardiovascular events.  Revised cardiac risk index.  He has no current pulmonary issues.

## 2023-04-01 NOTE — H&P (Signed)
[place]   PATIENT NAME: Joe [name]    MR#:  [id number]  DATE OF BIRTH:  September 05, 1946  DATE OF ADMISSION:  04/01/2023  PRIMARY CARE PHYSICIAN: Rosetta Posner, MD   Patient is coming from: SNF  REQUESTING/REFERRING PHYSICIAN: [name], MD  CHIEF COMPLAINT:   Chief Complaint  Patient presents with   Fall    HISTORY OF PRESENT ILLNESS:  Joe [name] is a 77 y.o. 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  He usually ambulates with a walker.  He noted acute onset of right hip pain with inability to ambulate after his fall.  He stated that he possibly hit his head with no injuries or loss of consciousness.  No paresthesias or focal muscle weakness.  He denies any nausea or vomiting.  No chest pain or palpitations.  No cough or wheezing or dyspnea.  No dysuria, oliguria or hematuria or flank pain.  No fever or chills.  His last Xarelto dose was yesterday morning.  He is overall a poor historian likely due to underlying dementia.  ED Course: When he came to the ER, BP was 170/85 with otherwise normal vital signs.  Labs revealed a calcium of 8.5 with a CO2 of 20, total protein of 6.3 and albumin 3 with total bili of 2.5.  CBC showed anemia with hemoglobin 11.2 and hematocrit 34.2 slightly worse compared to 3 years ago and thrombocytopenia of 95 compared to 253 then. EKG as reviewed by me : EKG showed likely sinus rhythm with a with a rate of 78 but was suboptimal Imaging: Right hip with pelvic x-ray revealed acute fracture of the proximal right femur.  Noncontrasted head CT scan revealed no acute intracranial normalities and C-spine CT showed no acute fracture or traumatic subluxation.  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  Portable chest x-ray showed no acute cardiopulmonary disease.  The patient was given 50 mcg of IV fentanyl.  He will be admitted to a surgical telemetry bed for further evaluation and management. PAST MEDICAL HISTORY:   Past Medical History:  Diagnosis Date   Alcoholic liver disease (HCC)    Cerebral infarction (HCC)    Chronic pancreatitis (HCC)    Depression    DVT, lower extremity (HCC)    Dysphagia, oropharyngeal phase    GERD (gastroesophageal reflux disease)    Hypertension    Iron deficiency anemia    Muscle weakness (generalized)    Pancreatitis    Patient denies medical problems    Personal history of transient ischemic attack    Thrombocytopenia, unspecified (HCC)    Vitamin D deficiency, unspecified     PAST SURGICAL HISTORY:   Past Surgical History:  Procedure Laterality Date   WHIPPLE PROCEDURE      SOCIAL HISTORY:   Social History   Tobacco Use   Smoking status: Former    Packs/day: 0.50    Years: 50.00    Additional pack years: 0.00    Total pack years: 25.00    Types: Cigarettes    Quit date: 02/16/2017    Years since  quitting: 6.1   Smokeless tobacco: Current  Substance Use Topics   Alcohol use: Not Currently    FAMILY HISTORY:   Family History  Problem Relation Age of Onset   Brain cancer Mother    Heart disease Brother     DRUG ALLERGIES:   Allergies  Allergen Reactions   Morphine Hives   Heparin     HIT   Morphine And Codeine Hives and Itching   Oxycodone-Acetaminophen Other (See Comments)    Altered mental status, goes "crazy" per family    REVIEW OF SYSTEMS:   ROS As per history of present illness. All pertinent systems were reviewed above. Constitutional, HEENT, cardiovascular, respiratory, GI, GU,  musculoskeletal, neuro, psychiatric, endocrine, integumentary and hematologic systems were reviewed and are otherwise negative/unremarkable except for positive findings mentioned above in the HPI.   MEDICATIONS AT HOME:   Prior to Admission medications   Medication Sig Start Date End Date Taking? Authorizing Provider  amLODipine (NORVASC) 2.5 MG tablet Take 2.5 mg by mouth daily. 04/29/21   [provider]  carvedilol (COREG) 3.125 MG tablet Take 3.125 mg by mouth 2 (two) times daily. 05/26/21   [provider]  Cholecalciferol 1.25 MG (50000 UT) TABS Take by mouth.    [provider]  lactulose (CEPHULAC) 10 g packet Take 10 g by mouth 3 (three) times daily.    [provider]  lipase/protease/amylase (CREON) 36000 UNITS CPEP capsule Take 72,000 Units by mouth 3 (three) times daily with meals.    [provider]  melatonin 3 MG TABS tablet Take 3 mg by mouth at bedtime.    [provider]  Multiple Vitamin (MULTIVITAMIN WITH MINERALS) TABS tablet Take 1 tablet by mouth daily. 03/29/18   Enedina Finner, MD  oxyCODONE (ROXICODONE) 5 MG immediate release tablet Take 1 tablet (5 mg total) by mouth every 6 (six) hours as needed for severe pain. 12/20/18   Rockne Menghini, MD  pantoprazole (PROTONIX) 40 MG tablet Take 1 tablet (40 mg total) by mouth daily. 03/29/18   Enedina Finner, MD  XARELTO 20 MG TABS tablet Take 20 mg by mouth daily. 05/29/21   [provider]  XIFAXAN 550 MG TABS tablet Take 550 mg by mouth 2 (two) times daily. 05/26/21   [provider]      VITAL SIGNS:  Blood pressure (!) 193/94, pulse 82, temperature 98.3 F (36.8 C), resp. rate 20, height 5\' 9"  (1.753 m), weight 69.2 kg, SpO2 94 %.  PHYSICAL EXAMINATION:  Physical Exam  GENERAL:  77 y.o.-year-old male patient lying in the bed with no acute distress.  EYES: Pupils equal, round, reactive to light and accommodation. No scleral icterus. Extraocular muscles  intact.  HEENT: Head atraumatic, normocephalic. Oropharynx and nasopharynx clear.  NECK:  Supple, no jugular venous distention. No thyroid enlargement, no tenderness.  LUNGS: Normal breath sounds bilaterally, no wheezing, rales,rhonchi or crepitation. No use of accessory muscles of respiration.  CARDIOVASCULAR: Regular rate and rhythm, S1, S2 normal. No murmurs, rubs, or gallops.  ABDOMEN: Soft, nondistended, nontender. Bowel sounds present. No organomegaly or mass.  EXTREMITIES: No pedal edema, cyanosis, or clubbing. Musculoskeletal: Right lateral hip tenderness. NEUROLOGIC: Cranial nerves II through XII are intact. Muscle strength 5/5 in all extremities. Sensation intact. Gait not checked.  PSYCHIATRIC: The patient is alert and oriented x 3.  Normal affect and good eye contact. SKIN: No obvious rash, lesion, or ulcer.   LABORATORY PANEL:   CBC Recent Labs  Lab 04/01/23 1856  WBC 5.9  HGB 11.2*  HCT 34.2*  PLT 95*   ------------------------------------------------------------------------------------------------------------------  Chemistries  Recent Labs  Lab 04/01/23 1856  NA 138  K 4.1  CL 109  CO2 20*  GLUCOSE 91  BUN 12  CREATININE 0.59*  CALCIUM 8.5*  AST 38  ALT 15  ALKPHOS 65  BILITOT 2.5*   ------------------------------------------------------------------------------------------------------------------  Cardiac Enzymes No results for input(s): "TROPONINI" in the last 168 hours. ------------------------------------------------------------------------------------------------------------------  RADIOLOGY:  DG Chest 1 View  Result Date: 04/01/2023 CLINICAL DATA:  Status post fall. EXAM: CHEST  1 VIEW COMPARISON:  March 26, 2018 FINDINGS: The heart size and mediastinal contours are within normal limits. Both lungs are clear. The visualized skeletal structures are unremarkable. IMPRESSION: No active disease. Electronically Signed   By: Aram Candela M.D.   On:  04/01/2023 18:57   DG HIP UNILAT WITH PELVIS 2-3 VIEWS RIGHT  Result Date: 04/01/2023 CLINICAL DATA:  Status post fall. EXAM: DG HIP (WITH OR WITHOUT PELVIS) 2-3V RIGHT COMPARISON:  None Available. FINDINGS: There is an acute fracture deformity extending through the neck of the proximal right femur. There is no evidence of dislocation. Mild degenerative changes are seen in the form of joint space narrowing and acetabular sclerosis. IMPRESSION: Acute fracture of the proximal right femur. Electronically Signed   By: Aram Candela M.D.   On: 04/01/2023 18:54   CT HEAD WO CONTRAST ( )  Result Date: 04/01/2023 CLINICAL DATA:  Head trauma, minor (Age >= 65y); Neck trauma (Age >= 65y) EXAM: CT HEAD WITHOUT CONTRAST CT CERVICAL SPINE WITHOUT CONTRAST TECHNIQUE: Multidetector CT imaging of the head and cervical spine was performed following the standard protocol without intravenous contrast. Multiplanar CT image reconstructions of the cervical spine were also generated. RADIATION DOSE REDUCTION: This exam was performed according to the departmental dose-optimization program which includes automated exposure control, adjustment of the mA and/or kV according to patient size and/or use of iterative reconstruction technique. COMPARISON:  None Available. FINDINGS: CT HEAD FINDINGS Brain: No evidence of acute infarction, hemorrhage, hydrocephalus, extra-axial collection or mass lesion/mass effect. Moderate to severe chronic microvascular ischemic change with generalized volume loss. Vascular: No hyperdense vessel or unexpected calcification. Skull: Normal. Negative for fracture or focal lesion. Sinuses/Orbits: No middle ear or mastoid effusion. Paranasal sinuses are clear. Orbits are unremarkable. Other: None. CT CERVICAL SPINE FINDINGS Alignment: Straightening of the normal cervical lordosis. Trace retrolisthesis of C3 on C4 and C4 on C5. Skull base and vertebrae: No acute fracture. No primary bone lesion or focal  pathologic process. Soft tissues and spinal canal: No prevertebral fluid or swelling. No visible canal hematoma. 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 Other: Asymmetric medialization of the right vocal cord, nonspecific. Correlate for symptoms of dysphonia. IMPRESSION: 1. No acute intracranial abnormality. 2. No acute fracture or traumatic subluxation of the cervical spine. 3. 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 4. Asymmetric medialization of the right vocal cord, nonspecific. Correlate for symptoms of dysphonia. Electronically Signed   By: Lorenza Cambridge M.D.   On: 04/01/2023 18:31   CT Cervical Spine Wo Contrast  Result Date: 04/01/2023 CLINICAL DATA:  Head trauma, minor (Age >= 65y); Neck trauma (Age >= 65y) EXAM: CT HEAD WITHOUT CONTRAST CT CERVICAL SPINE WITHOUT CONTRAST TECHNIQUE: Multidetector CT imaging of the head  and cervical spine was performed following the standard protocol without intravenous contrast. Multiplanar CT image reconstructions of the cervical spine were also generated. RADIATION DOSE REDUCTION: This exam was performed according to the departmental dose-optimization program which includes automated exposure control, adjustment of the mA and/or kV according to patient size and/or use of iterative reconstruction technique. COMPARISON:  None Available. FINDINGS: CT HEAD FINDINGS Brain: No evidence of acute infarction, hemorrhage, hydrocephalus, extra-axial collection or mass lesion/mass effect. Moderate to severe chronic microvascular ischemic change with generalized volume loss. Vascular: No hyperdense vessel or unexpected calcification. Skull: Normal. Negative for fracture or focal lesion. Sinuses/Orbits:  No middle ear or mastoid effusion. Paranasal sinuses are clear. Orbits are unremarkable. Other: None. CT CERVICAL SPINE FINDINGS Alignment: Straightening of the normal cervical lordosis. Trace retrolisthesis of C3 on C4 and C4 on C5. Skull base and vertebrae: No acute fracture. No primary bone lesion or focal pathologic process. Soft tissues and spinal canal: No prevertebral fluid or swelling. No visible canal hematoma. 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 Other: Asymmetric medialization of the right vocal cord, nonspecific. Correlate for symptoms of dysphonia. IMPRESSION: 1. No acute intracranial abnormality. 2. No acute fracture or traumatic subluxation of the cervical spine. 3. 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 4. Asymmetric medialization of the right vocal cord, nonspecific. Correlate for symptoms of dysphonia. Electronically Signed   By: Lorenza Cambridge M.D.   On: 04/01/2023 18:31      IMPRESSION AND PLAN:  Assessment and Plan: * Closed right hip fracture [facility]) - The patient will be admitted to the surgical telemetry bed. - Pain management will be provided. - His Xarelto will be held off. - Orthopedic consultation was obtained by Dr. Allena Katz in the ED. - The patient will be kept n.p.o. after midnight, for possible ORIF in AM. - He has no history of CVA, CAD, CHF, diabetes mellitus on insulin or renal failure with a creatinine more than 2.  He is considered at average risk for his age for perioperative cardiovascular events.  Revised cardiac risk index.  He has no current pulmonary issues.  Essential hypertension - We will continue his antihypertensives. - Beta-blocker therapy with Coreg should provide  perioperative cardiovascular risk reduction.  Chronic pancreatitis (HCC) - We will continue his Creon.  Alcoholic liver disease (HCC) - We will continue his lactulose and Xifaxan.  GERD without esophagitis - We will continue PPI therapy.    DVT prophylaxis: SCDs. Advanced Care Planning:  Code Status: full code.  Family Communication:  The plan of care was discussed in details with the patient (and family). I answered all questions. The patient agreed to proceed with the above mentioned plan. Further management will depend upon hospital course. Disposition Plan: Back to previous home environment Consults called: Orthopedic surgery All the records are reviewed and case discussed with ED provider.  Status is: Inpatient  At the time of the admission, it appears that the appropriate admission status for this patient is inpatient.  This is judged to be reasonable and necessary in order to provide the required intensity of service to ensure the patient's safety given the presenting symptoms, physical exam findings and initial radiographic and laboratory data in the context of comorbid conditions.  The patient requires inpatient status due to high intensity of  service, high risk of further deterioration and high frequency of surveillance required.  I certify that at the time of admission, it is my clinical judgment that the patient will require inpatient hospital care extending more than 2 midnights.                            Dispo: The patient is from: SNF              Anticipated d/c is to: SNF              Patient currently is not medically stable to d/c.              Difficult to place patient: No  Hannah Beat M.D on 04/01/2023 at 8:52 PM  [facility]   From 7 PM-7 AM, contact night-coverage [email or web address]  CC: Primary care physician; Rosetta Posner, MD

## 2023-04-01 NOTE — ED Notes (Signed)
Patient transported to CT 

## 2023-04-01 NOTE — ED Notes (Signed)
Pt requested to use urinal. Took off patients clothing and put in gown at this time.

## 2023-04-01 NOTE — Assessment & Plan Note (Signed)
-   We will continue his lactulose and Xifaxan.

## 2023-04-01 NOTE — Assessment & Plan Note (Signed)
-   We will continue PPI therapy 

## 2023-04-01 NOTE — ED Provider Notes (Signed)
[facility] Provider Note    Event Date/Time   First MD Initiated Contact with Patient 04/01/23 1722     (approximate)  History   Chief Complaint: Fall  HPI  Joe [name] is a 77 y.o. male with a past medical history of gastric reflux hypertension, anemia, presents to the emergency department for right hip pain after a fall.  According to report patient is coming from his nursing facility where he had a fall.  States he possibly hit his head as well no LOC, no vomiting.  Patient is having moderate pain to the right hip, worse with movement.  Denies any other pain.  Physical Exam   Triage Vital Signs: ED Triage Vitals  Enc Vitals Group     BP 04/01/23 1725 (!) 170/85     Pulse Rate 04/01/23 1725 80     Resp 04/01/23 1725 20     Temp 04/01/23 1725 98.1 F (36.7 C)     Temp Source 04/01/23 1725 Oral     SpO2 04/01/23 1725 100 %     Weight 04/01/23 1729 152 lb 8 oz (69.2 kg)     Height 04/01/23 1729 5\' 9"  (1.753 m)     Head Circumference --      Peak Flow --      Pain Score 04/01/23 1724 10     Pain Loc --      Pain Edu? --      Excl. in GC? --     Most recent vital signs: Vitals:   04/01/23 1725  BP: (!) 170/85  Pulse: 80  Resp: 20  Temp: 98.1 F (36.7 C)  SpO2: 100%    General: Awake, no distress.  CV:  Good peripheral perfusion.  Regular rate and rhythm  Resp:  Normal effort.  Equal breath sounds bilaterally.  Abd:  No distention.  Soft, nontender.  No rebound or guarding. Other:  Moderate right hip tenderness to palpation slightly shortened and externally rotated right lower extremity neurovascular intact distally.  Pain with any attempted range of motion passive or active.   ED Results / Procedures / Treatments   EKG  EKG viewed and interpreted by myself shows what appears to be a sinus rhythm with a narrow QRS, normal axis, normal intervals.  Significant electrical interference but no obvious ST elevation.  RADIOLOGY  I reviewed  and interpreted the x-ray images.  Patient has a right femoral neck fracture on my evaluation. Radiology confirms acute fracture of the proximal right femur.  Chest x-ray is negative, CT scan of the head and C-spine are negative for any acute intracranial abnormality or fracture.   MEDICATIONS ORDERED IN ED: Medications - No data to display   IMPRESSION / MDM / ASSESSMENT AND PLAN / ED COURSE  I reviewed the triage vital signs and the nursing notes.  Patient's presentation is most consistent with acute presentation with potential threat to life or bodily function.  Patient presents emergency department for right hip pain after a fall.  On examination patient has moderate tenderness to palpation externally rotated and possibly slightly shortened right lower extremity neurovascular intact distally.  Given the patient's possible head injury we will obtain CT imaging of the head and C-spine as precaution we will obtain hip and pelvis x-rays and continue to closely monitor.  Patient agreeable to plan of care.  FINAL CLINICAL IMPRESSION(S) / ED DIAGNOSES   Right hip pain Fall   Note:  This document was prepared using Dragon  voice recognition software and may include unintentional dictation errors.   [name], MD 04/01/23 2322

## 2023-04-01 NOTE — ED Notes (Signed)
Patient being transported up to inpatient room at this time.

## 2023-04-01 NOTE — ED Triage Notes (Signed)
Patient came via EMS from [facility]. Pt fell at approximately 1130. Facility states he hit head but he is not complaining of any head pain only R hip pain, has no shortening.   EMS vitals - BP 197/97, HR 80, SPO2 98%

## 2023-04-01 NOTE — Assessment & Plan Note (Signed)
-   We will continue his antihypertensives. - Beta-blocker therapy with Coreg should provide perioperative cardiovascular risk reduction.

## 2023-04-02 ENCOUNTER — Encounter: Payer: Self-pay | Admitting: Family Medicine

## 2023-04-02 ENCOUNTER — Other Ambulatory Visit: Payer: Self-pay

## 2023-04-02 ENCOUNTER — Inpatient Hospital Stay: Payer: Medicare Other | Admitting: Anesthesiology

## 2023-04-02 ENCOUNTER — Inpatient Hospital Stay: Payer: Medicare Other

## 2023-04-02 ENCOUNTER — Encounter
Admission: EM | Disposition: A | Discharge status: Skilled Nursing Facility | Payer: Self-pay | Source: Skilled Nursing Facility | Attending: Internal Medicine

## 2023-04-02 DIAGNOSIS — S72001D Fracture of unspecified part of neck of right femur, subsequent encounter for closed fracture with routine healing: Secondary | ICD-10-CM

## 2023-04-02 HISTORY — PX: HIP ARTHROPLASTY: SHX981

## 2023-04-02 LAB — TYPE AND SCREEN
ABO/RH(D): A POS
Antibody Screen: NEGATIVE

## 2023-04-02 LAB — MRSA NEXT GEN BY PCR, NASAL: MRSA by PCR Next Gen: NOT DETECTED

## 2023-04-02 SURGERY — HEMIARTHROPLASTY, HIP, DIRECT ANTERIOR APPROACH, FOR FRACTURE
Anesthesia: General | Site: Hip | Laterality: Right

## 2023-04-02 MED ORDER — FLEET ENEMA 7-19 GM/118ML RE ENEM: 1.0000 | ENEMA | Freq: Once | RECTAL | Status: DC | PRN

## 2023-04-02 MED ORDER — PROPOFOL 10 MG/ML IV BOLUS
INTRAVENOUS | Status: DC | PRN
  Administered 2023-04-02: 80 mg via INTRAVENOUS

## 2023-04-02 MED ORDER — MIRTAZAPINE 15 MG PO TABS
15.0000 mg | ORAL_TABLET | Freq: Every day | ORAL | Status: DC
  Administered 2023-04-02 – 2023-04-04 (×3): 15 mg via ORAL
  Filled 2023-04-02 (×4): qty 1

## 2023-04-02 MED ORDER — PHENYLEPHRINE HCL (PRESSORS) 10 MG/ML IV SOLN
INTRAVENOUS | Status: DC | PRN
  Administered 2023-04-02: 80 ug via INTRAVENOUS

## 2023-04-02 MED ORDER — ACETAMINOPHEN 10 MG/ML IV SOLN
INTRAVENOUS | Status: DC | PRN
  Administered 2023-04-02: 1000 mg via INTRAVENOUS

## 2023-04-02 MED ORDER — PROPOFOL 10 MG/ML IV BOLUS
INTRAVENOUS | Status: AC
  Filled 2023-04-02: qty 20

## 2023-04-02 MED ORDER — PHENYLEPHRINE 80 MCG/ML (10ML) SYRINGE FOR IV PUSH (FOR BLOOD PRESSURE SUPPORT)
PREFILLED_SYRINGE | INTRAVENOUS | Status: AC
  Filled 2023-04-02: qty 10

## 2023-04-02 MED ORDER — CEFAZOLIN SODIUM-DEXTROSE 2-4 GM/100ML-% IV SOLN
2.0000 g | Freq: Four times a day (QID) | INTRAVENOUS | Status: AC
  Administered 2023-04-02 – 2023-04-03 (×2): 2 g via INTRAVENOUS
  Filled 2023-04-02 (×2): qty 100

## 2023-04-02 MED ORDER — MELATONIN 3 MG PO TABS
3.0000 mg | ORAL_TABLET | Freq: Every day | ORAL | Status: DC
  Filled 2023-04-02: qty 1

## 2023-04-02 MED ORDER — ENSURE ENLIVE PO LIQD
237.0000 mL | Freq: Two times a day (BID) | ORAL | Status: DC
  Administered 2023-04-03 – 2023-04-05 (×3): 237 mL via ORAL

## 2023-04-02 MED ORDER — FENTANYL CITRATE (PF) 100 MCG/2ML IJ SOLN
INTRAMUSCULAR | Status: AC
  Filled 2023-04-02: qty 2

## 2023-04-02 MED ORDER — HYDROMORPHONE HCL 1 MG/ML IJ SOLN
0.2000 mg | INTRAMUSCULAR | Status: DC | PRN
  Administered 2023-04-03: 0.4 mg via INTRAVENOUS
  Filled 2023-04-02: qty 1

## 2023-04-02 MED ORDER — DEXAMETHASONE SODIUM PHOSPHATE 10 MG/ML IJ SOLN
INTRAMUSCULAR | Status: DC | PRN
  Administered 2023-04-02: 5 mg via INTRAVENOUS

## 2023-04-02 MED ORDER — ONDANSETRON HCL 4 MG/2ML IJ SOLN
INTRAMUSCULAR | Status: AC
  Filled 2023-04-02: qty 2

## 2023-04-02 MED ORDER — TRANEXAMIC ACID-NACL 1000-0.7 MG/100ML-% IV SOLN
INTRAVENOUS | Status: DC | PRN
  Administered 2023-04-02: 1000 mg via INTRAVENOUS

## 2023-04-02 MED ORDER — BUPIVACAINE HCL (PF) 0.5 % IJ SOLN
INTRAMUSCULAR | Status: AC
  Filled 2023-04-02: qty 30

## 2023-04-02 MED ORDER — BISACODYL 10 MG RE SUPP: 10.0000 mg | Freq: Every day | RECTAL | Status: DC | PRN

## 2023-04-02 MED ORDER — TRAMADOL HCL 50 MG PO TABS
50.0000 mg | ORAL_TABLET | Freq: Four times a day (QID) | ORAL | Status: DC | PRN
  Administered 2023-04-03 – 2023-04-05 (×3): 50 mg via ORAL
  Filled 2023-04-02 (×3): qty 1

## 2023-04-02 MED ORDER — SENNOSIDES-DOCUSATE SODIUM 8.6-50 MG PO TABS: 1.0000 | ORAL_TABLET | Freq: Every evening | ORAL | Status: DC | PRN

## 2023-04-02 MED ORDER — MELATONIN 5 MG PO TABS
5.0000 mg | ORAL_TABLET | Freq: Every day | ORAL | Status: DC
  Administered 2023-04-02 – 2023-04-04 (×3): 5 mg via ORAL
  Filled 2023-04-02 (×3): qty 1

## 2023-04-02 MED ORDER — LIDOCAINE HCL (CARDIAC) PF 100 MG/5ML IV SOSY
PREFILLED_SYRINGE | INTRAVENOUS | Status: DC | PRN
  Administered 2023-04-02: 100 mg via INTRAVENOUS

## 2023-04-02 MED ORDER — ROCURONIUM BROMIDE 100 MG/10ML IV SOLN
INTRAVENOUS | Status: DC | PRN
  Administered 2023-04-02: 50 mg via INTRAVENOUS

## 2023-04-02 MED ORDER — METOCLOPRAMIDE HCL 5 MG/ML IJ SOLN: 5.0000 mg | Freq: Three times a day (TID) | INTRAMUSCULAR | Status: DC | PRN

## 2023-04-02 MED ORDER — TRANEXAMIC ACID 1000 MG/10ML IV SOLN
INTRAVENOUS | Status: AC
  Filled 2023-04-02: qty 10

## 2023-04-02 MED ORDER — KETOROLAC TROMETHAMINE 15 MG/ML IJ SOLN
7.5000 mg | Freq: Four times a day (QID) | INTRAMUSCULAR | Status: AC
  Administered 2023-04-02 – 2023-04-03 (×3): 7.5 mg via INTRAVENOUS
  Filled 2023-04-02 (×3): qty 1

## 2023-04-02 MED ORDER — METOCLOPRAMIDE HCL 5 MG PO TABS: 5.0000 mg | ORAL_TABLET | Freq: Three times a day (TID) | ORAL | Status: DC | PRN

## 2023-04-02 MED ORDER — AMLODIPINE BESYLATE 5 MG PO TABS
2.5000 mg | ORAL_TABLET | Freq: Every day | ORAL | Status: DC
  Administered 2023-04-02 – 2023-04-05 (×4): 2.5 mg via ORAL
  Filled 2023-04-02 (×4): qty 1

## 2023-04-02 MED ORDER — TRANEXAMIC ACID-NACL 1000-0.7 MG/100ML-% IV SOLN
INTRAVENOUS | Status: AC
  Filled 2023-04-02: qty 100

## 2023-04-02 MED ORDER — ACETAMINOPHEN 500 MG PO TABS
1000.0000 mg | ORAL_TABLET | Freq: Three times a day (TID) | ORAL | Status: DC
  Administered 2023-04-02 – 2023-04-05 (×6): 1000 mg via ORAL
  Filled 2023-04-02 (×7): qty 2

## 2023-04-02 MED ORDER — 0.9 % SODIUM CHLORIDE (POUR BTL) OPTIME
TOPICAL | Status: DC | PRN
  Administered 2023-04-02: 1200 mL

## 2023-04-02 MED ORDER — SENNA 8.6 MG PO TABS
1.0000 | ORAL_TABLET | Freq: Two times a day (BID) | ORAL | Status: DC
  Administered 2023-04-02 – 2023-04-05 (×5): 8.6 mg via ORAL
  Filled 2023-04-02 (×6): qty 1

## 2023-04-02 MED ORDER — LACTULOSE 10 GM/15ML PO SOLN
10.0000 g | Freq: Three times a day (TID) | ORAL | Status: DC
  Administered 2023-04-02 – 2023-04-05 (×6): 10 g via ORAL
  Filled 2023-04-02 (×8): qty 30

## 2023-04-02 MED ORDER — ONDANSETRON HCL 4 MG/2ML IJ SOLN: 4.0000 mg | Freq: Four times a day (QID) | INTRAMUSCULAR | Status: DC | PRN

## 2023-04-02 MED ORDER — ONDANSETRON HCL 4 MG/2ML IJ SOLN
INTRAMUSCULAR | Status: DC | PRN
  Administered 2023-04-02: 4 mg via INTRAVENOUS

## 2023-04-02 MED ORDER — SUGAMMADEX SODIUM 200 MG/2ML IV SOLN
INTRAVENOUS | Status: DC | PRN
  Administered 2023-04-02: 140 mg via INTRAVENOUS

## 2023-04-02 MED ORDER — RIVAROXABAN 20 MG PO TABS
20.0000 mg | ORAL_TABLET | Freq: Every day | ORAL | Status: DC
  Administered 2023-04-03 – 2023-04-05 (×3): 20 mg via ORAL
  Filled 2023-04-02 (×3): qty 1

## 2023-04-02 MED ORDER — RIFAXIMIN 550 MG PO TABS
550.0000 mg | ORAL_TABLET | Freq: Two times a day (BID) | ORAL | Status: DC
  Administered 2023-04-02 – 2023-04-05 (×7): 550 mg via ORAL
  Filled 2023-04-02 (×7): qty 1

## 2023-04-02 MED ORDER — PANTOPRAZOLE SODIUM 40 MG PO TBEC
40.0000 mg | DELAYED_RELEASE_TABLET | Freq: Every day | ORAL | Status: DC
  Administered 2023-04-02 – 2023-04-05 (×4): 40 mg via ORAL
  Filled 2023-04-02 (×4): qty 1

## 2023-04-02 MED ORDER — CARVEDILOL 3.125 MG PO TABS
3.1250 mg | ORAL_TABLET | Freq: Two times a day (BID) | ORAL | Status: DC
  Administered 2023-04-02 – 2023-04-05 (×6): 3.125 mg via ORAL
  Filled 2023-04-02 (×7): qty 1

## 2023-04-02 MED ORDER — SODIUM CHLORIDE 0.9 % IV SOLN: INTRAVENOUS | Status: DC

## 2023-04-02 MED ORDER — DEXMEDETOMIDINE HCL IN NACL 80 MCG/20ML IV SOLN
INTRAVENOUS | Status: DC | PRN
  Administered 2023-04-02 (×2): 8 ug via INTRAVENOUS

## 2023-04-02 MED ORDER — FENTANYL CITRATE (PF) 100 MCG/2ML IJ SOLN
25.0000 ug | INTRAMUSCULAR | Status: DC | PRN
  Administered 2023-04-02 (×2): 25 ug via INTRAVENOUS

## 2023-04-02 MED ORDER — DOCUSATE SODIUM 100 MG PO CAPS
100.0000 mg | ORAL_CAPSULE | Freq: Two times a day (BID) | ORAL | Status: DC
  Administered 2023-04-02 – 2023-04-05 (×5): 100 mg via ORAL
  Filled 2023-04-02 (×6): qty 1

## 2023-04-02 MED ORDER — KETOROLAC TROMETHAMINE 15 MG/ML IJ SOLN
15.0000 mg | Freq: Four times a day (QID) | INTRAMUSCULAR | Status: DC | PRN
  Administered 2023-04-02: 15 mg via INTRAVENOUS
  Filled 2023-04-02: qty 1

## 2023-04-02 MED ORDER — BUPIVACAINE LIPOSOME 1.3 % IJ SUSP
INTRAMUSCULAR | Status: AC
  Filled 2023-04-02: qty 20

## 2023-04-02 MED ORDER — PHENYLEPHRINE HCL (PRESSORS) 10 MG/ML IV SOLN
INTRAVENOUS | Status: AC
  Filled 2023-04-02: qty 1

## 2023-04-02 MED ORDER — PHENOL 1.4 % MT LIQD: 1.0000 | OROMUCOSAL | Status: DC | PRN

## 2023-04-02 MED ORDER — ADULT MULTIVITAMIN W/MINERALS CH
1.0000 | ORAL_TABLET | Freq: Every day | ORAL | Status: DC
  Administered 2023-04-03 – 2023-04-05 (×3): 1 via ORAL
  Filled 2023-04-02 (×3): qty 1

## 2023-04-02 MED ORDER — FENTANYL CITRATE (PF) 100 MCG/2ML IJ SOLN
INTRAMUSCULAR | Status: DC | PRN
  Administered 2023-04-02: 25 ug via INTRAVENOUS
  Administered 2023-04-02: 50 ug via INTRAVENOUS
  Administered 2023-04-02: 25 ug via INTRAVENOUS

## 2023-04-02 MED ORDER — BUPIVACAINE LIPOSOME 1.3 % IJ SUSP
INTRAMUSCULAR | Status: DC | PRN
  Administered 2023-04-02: 50 mL

## 2023-04-02 MED ORDER — ONDANSETRON HCL 4 MG PO TABS: 4.0000 mg | ORAL_TABLET | Freq: Four times a day (QID) | ORAL | Status: DC | PRN

## 2023-04-02 MED ORDER — SODIUM CHLORIDE (PF) 0.9 % IJ SOLN
INTRAMUSCULAR | Status: AC
  Filled 2023-04-02: qty 100

## 2023-04-02 MED ORDER — PANCRELIPASE (LIP-PROT-AMYL) 36000-114000 UNITS PO CPEP
72000.0000 [IU] | ORAL_CAPSULE | Freq: Three times a day (TID) | ORAL | Status: DC
  Administered 2023-04-03 – 2023-04-05 (×5): 72000 [IU] via ORAL
  Filled 2023-04-02 (×6): qty 2

## 2023-04-02 MED ORDER — MENTHOL 3 MG MT LOZG: 1.0000 | LOZENGE | OROMUCOSAL | Status: DC | PRN

## 2023-04-02 MED ORDER — TRANEXAMIC ACID-NACL 1000-0.7 MG/100ML-% IV SOLN
1000.0000 mg | Freq: Once | INTRAVENOUS | Status: AC
  Administered 2023-04-02: 1000 mg via INTRAVENOUS

## 2023-04-02 SURGICAL SUPPLY — 73 items
ADH SKN CLS APL DERMABOND .7 (GAUZE/BANDAGES/DRESSINGS) ×1
APL PRP STRL LF DISP 70% ISPRP (MISCELLANEOUS) ×2
BLADE SAW SGTL 13X75X1.27 (BLADE) ×1 IMPLANT
BNDG CMPR 5X4 CHSV STRCH STRL (GAUZE/BANDAGES/DRESSINGS) ×1
BNDG COHESIVE 4X5 TAN STRL LF (GAUZE/BANDAGES/DRESSINGS) ×1 IMPLANT
BOWL CEMENT MIXING ADV NOZZLE (MISCELLANEOUS) ×1 IMPLANT
CEMENT BONE 10-PACK (Cement) ×2 IMPLANT
CHLORAPREP W/TINT 26 (MISCELLANEOUS) ×2 IMPLANT
COVER BACK TABLE REUSABLE LG (DRAPES) ×1 IMPLANT
COVER MAYO STAND STRL (DRAPES) ×1 IMPLANT
DERMABOND ADVANCED .7 DNX12 (GAUZE/BANDAGES/DRESSINGS) ×1 IMPLANT
DRAPE 3/4 80X56 (DRAPES) ×1 IMPLANT
DRAPE INCISE IOBAN 66X60 STRL (DRAPES) ×1 IMPLANT
DRAPE ORTHO SPLIT 77X108 STRL (DRAPES) ×1
DRAPE SURG ORHT 6 SPLT 77X108 (DRAPES) ×1 IMPLANT
DRAPE U-SHAPE 47X51 STRL (DRAPES) ×1 IMPLANT
DRSG OPSITE POSTOP 4X10 (GAUZE/BANDAGES/DRESSINGS) IMPLANT
DRSG OPSITE POSTOP 4X8 (GAUZE/BANDAGES/DRESSINGS) IMPLANT
ELECT REM PT RETURN 9FT ADLT (ELECTROSURGICAL) ×2
ELECTRODE REM PT RTRN 9FT ADLT (ELECTROSURGICAL) ×1 IMPLANT
GAUZE SPONGE 4X4 12PLY STRL (GAUZE/BANDAGES/DRESSINGS) IMPLANT
GAUZE XEROFORM 1X8 LF (GAUZE/BANDAGES/DRESSINGS) ×1 IMPLANT
GLOVE BIOGEL PI IND STRL 8 (GLOVE) ×2 IMPLANT
GLOVE SKINSENSE STRL SZ8.0 LF (GLOVE) ×1 IMPLANT
GLOVE SURG ORTHO 8.0 STRL STRW (GLOVE) ×2 IMPLANT
GOWN STRL REUS W/ TWL LRG LVL3 (GOWN DISPOSABLE) ×1 IMPLANT
GOWN STRL REUS W/ TWL XL LVL3 (GOWN DISPOSABLE) ×1 IMPLANT
GOWN STRL REUS W/TWL LRG LVL3 (GOWN DISPOSABLE) ×1
GOWN STRL REUS W/TWL XL LVL3 (GOWN DISPOSABLE) ×1
HEAD MODULAR ENDO (Orthopedic Implant) ×1 IMPLANT
HEAD UNPLR 52XMDLR STRL HIP (Orthopedic Implant) IMPLANT
HEMOVAC 400ML (MISCELLANEOUS)
HOOD PEEL AWAY T7 (MISCELLANEOUS) ×2 IMPLANT
IMMBOLIZER KNEE 19 BLUE UNIV (SOFTGOODS) ×1 IMPLANT
IV NS IRRIG 3000ML ARTHROMATIC (IV SOLUTION) ×1 IMPLANT
KIT DRAIN HEMOVAC JP 7FR 400ML (MISCELLANEOUS) IMPLANT
KIT PREP HIP W/CEMENT RESTRICT (Miscellaneous) ×1 IMPLANT
KIT PREPARATION TOTAL HIP (Miscellaneous) ×1 IMPLANT
KIT TURNOVER KIT A (KITS) ×1 IMPLANT
MANIFOLD NEPTUNE II (INSTRUMENTS) ×2 IMPLANT
NDL FILTER BLUNT 18X1 1/2 (NEEDLE) ×1 IMPLANT
NDL HYPO 22X1.5 SAFETY MO (MISCELLANEOUS) ×1 IMPLANT
NDL MAYO CATGUT SZ1 (NEEDLE) ×1 IMPLANT
NDL SAFETY ECLIP 18X1.5 (MISCELLANEOUS) ×1 IMPLANT
NEEDLE FILTER BLUNT 18X1 1/2 (NEEDLE) ×1 IMPLANT
NEEDLE HYPO 22X1.5 SAFETY MO (MISCELLANEOUS) ×2 IMPLANT
NEEDLE MAYO CATGUT SZ1 (NEEDLE) ×1 IMPLANT
NS IRRIG 1000ML POUR BTL (IV SOLUTION) ×1 IMPLANT
PACK HIP PROSTHESIS (MISCELLANEOUS) ×1 IMPLANT
PENCIL SMOKE EVACUATOR (MISCELLANEOUS) ×1 IMPLANT
PULSAVAC PLUS IRRIG FAN TIP (DISPOSABLE) ×1
SLEEVE UNITRAX V40 (Orthopedic Implant) ×1 IMPLANT
SLEEVE UNITRAX V40 +4 (Orthopedic Implant) IMPLANT
SPACER CENTRAL ACCOLADE 4/5X14 (Spacer) IMPLANT
SPONGE T-LAP 18X18 ~~LOC~~+RFID (SPONGE) ×2 IMPLANT
STAPLER SKIN PROX 35W (STAPLE) ×1 IMPLANT
STEM HIP ACCOLADE SZ4 35X137 (Stem) IMPLANT
SUT ETHIBOND #5 BRAIDED 30INL (SUTURE) ×1 IMPLANT
SUT MNCRL 4-0 (SUTURE) ×1
SUT MNCRL 4-0 27XMFL (SUTURE) ×1
SUT VIC AB 0 CT1 36 (SUTURE) ×1 IMPLANT
SUT VIC AB 2-0 CT2 27 (SUTURE) ×2 IMPLANT
SUTURE MNCRL 4-0 27XMF (SUTURE) ×1 IMPLANT
SYR 20ML LL LF (SYRINGE) ×2 IMPLANT
SYR 50ML LL SCALE MARK (SYRINGE) ×1 IMPLANT
TAPE MICROFOAM 4IN (TAPE) ×1 IMPLANT
TIP BRUSH PULSAVAC PLUS 24.33 (MISCELLANEOUS) ×1 IMPLANT
TIP FAN IRRIG PULSAVAC PLUS (DISPOSABLE) ×1 IMPLANT
TRAP FLUID SMOKE EVACUATOR (MISCELLANEOUS) ×1 IMPLANT
TUBE KAMVAC SUCTION (TUBING) ×1 IMPLANT
TUBE SUCT KAM VAC (TUBING) ×1 IMPLANT
TUBING CONNECTING 10 (TUBING) IMPLANT
WATER STERILE IRR 500ML POUR (IV SOLUTION) ×1 IMPLANT

## 2023-04-02 NOTE — Op Note (Signed)
DATE OF SURGERY: 04/02/2023  PREOPERATIVE DIAGNOSIS: Right femoral neck fracture  POSTOPERATIVE DIAGNOSIS: Right femoral neck fracture  PROCEDURE: Right hip hemiarthroplasty  SURGEON: Rosealee Albee, MD  ASSISTANTS:   EBL: 300 cc  COMPONENTS:  Stryker - Accolade C Size 4 Stem Stryker - Unitrax 52mm head with +4 offset neck   INDICATIONS: Joe [name] is a 77 y.o. male who sustained a displaced femoral neck fracture after a fall. Risks and benefits of hip hemiarthroplasty were explained to the patient and/or family. Risks include but are not limited to bleeding, infection, injury to tissues, nerves, vessels, periprosthetic infection, dislocation, limb length discrepancy and risks of anesthesia. The patient and/or family understands these risks, has completed an informed consent and wishes to proceed.   PROCEDURE:  The patient was identified in the preoperative holding area and the operative extremity was marked.  The patient was then transferred to the operating room suite and mobilized from the hospital gurney to the operating room table. Spinal anesthesia was administered without complication. The patient was then transitioned to a lateral position.  All bony prominences were padded per protocol.  An axillary roll was placed.  Careful attention was paid to the contralateral side peroneal nerve, which was free from pressure with use of appropriate padding and blankets. A time-out was performed to confirm the patient's identity and the correct laterality of surgery. The patient was then prepped and draped in the usual sterile fashion. Appropriate pre-operative antibiotics were administered. Tranexamic acid was administered preoperatively.    An incision that centered on the posterior tip of the greater trochanter with a posterior curve was made. Dissection was carried down through the subcutaneous tissue.  Careful attention was made to maintain hemostasis using electrocautery.  Dissection  brought [place] to the level of the deep fascia where the gluteus maximus muscle and proximal portion of the IT band were identified.  The proximal region of the IT band was incised in linear fashion and this incision was extended proximally in a curvilinear fashion to split the gluteus maximus muscle parallel to its fibers to minimize bleeding.  This was accomplished using a combination bovie electrocautery as well as blunt dissection.  The trochanteric bursa was then visualized and dissected from anterior to posterior. A blunt homan retractor was placed beneath the abductors. The piriformis tendon and short external rotators were visualized. Bovie electrocautery was used to cut these with the capsule as one L-shaped flap. This was tagged at the corner with #5 Ethibond. At this point, the femoral neck fracture was visualized. An oscillating saw was used to make a new neck cut approximately 15mm above the lesser tuberosity with the use of a neck cut guide. The head was then freed from its remaining soft tissue attachments and measured. The head trial was then inserted into the acetabulum and sized appropriately.    We then turned our attention to preparing the femoral canal. First, a box cut was performed utilizing the box osteotome. A canal finder was inserted by hand and sequential broaching was then performed. The calcar planer was inserted onto the broach and used to smooth the calcar appropriately.  A trial stem, neutral neck, and head were inserted into the acetabulum and placed through range of motion. Intraoperative radiographs were taken to assess hardware position and leg lengths. The hip was again dislocated and the femoral trial components were removed. An appropriately sized cement restrictor was placed. The canal was irrigated with pulse lavage and dried. A cement gun was used  to place cement into the femoral canal. Cement was then pressurized. The actual stem was inserted into the femoral canal and then  driven onto the calcar. Position was held until the cement hardened. The trial head was then again inserted on the femoral component and found to be appropriate. Trial was removed and the permanent head/neck was Morse tapered onto the femoral stem and then reduced into the acetabulum.    The hip stability and length were reassessed and found to be satisfactory.  The wound was then copiously irrigated with normal saline solution. The tagged sutures of the capsule and piriformis were sewn to the gluteus medius tendon. This adequately closed the hip capsule. The IT band and gluteus maximus fascia were then closed with 0-Vicryl in a running, locked fashion. A mixture of Exparel and bupivicaine was administered.  The subdermal layer was closed with 2-0 Vicryl in a buried interrupted fashion. Skin was approximated with staples.  Sterile dressing was applied. A knee immobilizer was placed. The patient was mobilized from the lateral position back to supine on the operating room table and then awakened from general anesthesia without complication.   POSTOPERATIVE PLAN: The patient will be WBAT on operative extremity. Pre-admission Xarelto to start on POD#1. IV Abx x 24 hours. PT/OT on POD#1. Posterior hip precautions.

## 2023-04-02 NOTE — Anesthesia Preprocedure Evaluation (Addendum)
Anesthesia Evaluation  Patient identified by MRN, date of birth, ID band Patient awake    Reviewed: Allergy & Precautions, NPO status , Patient's Chart, lab work & pertinent test results  History of Anesthesia Complications (+) Emergence Delirium and history of anesthetic complications  Airway Mallampati: III  TM Distance: >3 FB Neck ROM: full    Dental  (+) Chipped, Poor Dentition, Missing   Pulmonary neg shortness of breath, COPD, former smoker   Pulmonary exam normal        Cardiovascular Exercise Tolerance: Good hypertension, (-) angina (-) Past MI Normal cardiovascular exam     Neuro/Psych  PSYCHIATRIC DISORDERS      CVA, Residual Symptoms    GI/Hepatic Neg liver ROS,GERD  Controlled,,  Endo/Other  negative endocrine ROS    Renal/GU      Musculoskeletal   Abdominal   Peds  Hematology negative hematology ROS (+)   Anesthesia Other Findings Past Medical History: No date: Alcoholic liver disease (HCC) No date: Cerebral infarction (HCC) No date: Chronic pancreatitis (HCC) No date: Depression No date: DVT, lower extremity (HCC) No date: Dysphagia, oropharyngeal phase No date: GERD (gastroesophageal reflux disease) No date: Hypertension No date: Iron deficiency anemia No date: Muscle weakness (generalized) No date: Pancreatitis No date: Patient denies medical problems No date: Personal history of transient ischemic attack No date: Thrombocytopenia, unspecified (HCC) No date: Vitamin D deficiency, unspecified  Past Surgical History: No date: WHIPPLE PROCEDURE  BMI    Body Mass Index: 22.52 kg/m      Reproductive/Obstetrics negative OB ROS                             Anesthesia Physical Anesthesia Plan  ASA: 3  Anesthesia Plan: General ETT   Post-op Pain Management:    Induction: Intravenous  PONV Risk Score and Plan: Ondansetron, Dexamethasone, Midazolam and  Treatment may vary due to age or medical condition  Airway Management Planned: Oral ETT  Additional Equipment:   Intra-op Plan:   Post-operative Plan: Extubation in OR  Informed Consent: I have reviewed the patients History and Physical, chart, labs and discussed the procedure including the risks, benefits and alternatives for the proposed anesthesia with the patient or authorized representative who has indicated his/her understanding and acceptance.   Patient has DNR.  Discussed DNR with patient, Discussed DNR with power of attorney and Suspend DNR.   Dental Advisory Given  Plan Discussed with: Anesthesiologist, CRNA and Surgeon  Anesthesia Plan Comments: (Patient and daughter consented for risks of anesthesia including but not limited to:  - adverse reactions to medications - damage to eyes, teeth, lips or other oral mucosa - nerve damage due to positioning  - sore throat or hoarseness - Damage to heart, brain, nerves, lungs, other parts of body or loss of life  They voiced understanding.)       Anesthesia Quick Evaluation

## 2023-04-02 NOTE — Progress Notes (Signed)
PROGRESS NOTE    Joe [name]  AVW:[id number] DOB: Dec 02, 1945 DOA: 04/01/2023 PCP: Rosetta Posner, MD    Brief Narrative:  77 y.o. 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  He usually ambulates with a walker.  He noted acute onset of right hip pain with inability to ambulate after his fall.  He stated that he possibly hit his head with no injuries or loss of consciousness.  No paresthesias or focal muscle weakness.  He denies any nausea or vomiting.  No chest pain or palpitations.  No cough or wheezing or dyspnea.  No dysuria, oliguria or hematuria or flank pain.  No fever or chills.  His last Xarelto dose was yesterday morning.  He is overall a poor historian likely due to underlying dementia   Assessment & Plan:   Principal Problem:   Closed right hip fracture (HCC) Active Problems:   Essential hypertension   GERD without esophagitis   Alcoholic liver disease (HCC)   Chronic pancreatitis (HCC)  * Closed right hip fracture [facility]) Orthopedics consulted from ED.  Plan for surgical management. 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   History limited by underlying dementia.  Patient seems somewhat agitated.  Objective: Vitals:   04/01/23 2028 04/01/23 2257 04/02/23 0501 04/02/23 0855  BP: (!) 193/94 (!) 191/97 (!) 162/82 (!) 122/46  Pulse: 82 64 90 87  Resp: 20  18 17   Temp: 98.3 F (36.8 C)  97.8 F (36.6 C) 98.1 F (36.7 C)  TempSrc:      SpO2: 94%  100% 98%  Weight:      Height:        Intake/Output Summary (Last 24 hours) at 04/02/2023 1120 Last data filed at 04/02/2023 0900 Gross per 24 hour  Intake --  Output 350 ml  Net -350 ml   Filed Weights   04/01/23 1729  Weight: 69.2 kg    Examination:  General exam: NAD.  Appears confused Respiratory system: Clear to auscultation. Respiratory effort normal. Cardiovascular system: S1-S2, RRR, no murmurs, no pedal edema Gastrointestinal system: Abdomen is nondistended, soft and nontender. No organomegaly or masses felt. Normal bowel sounds heard. Central nervous system: Alert and oriented. No focal neurological deficits. Extremities: Right lower EXTR shortened and externally rotated Skin: No rashes, lesions or ulcers Psychiatry: Judgement and insight appear impaired. Mood & affect agitated.     Data Reviewed: I have personally reviewed following labs and imaging studies  CBC: Recent Labs  Lab 04/01/23 1856  WBC 5.9  HGB 11.2*  HCT 34.2*  MCV 86.6  PLT 95*   Basic Metabolic Panel: Recent Labs  Lab 04/01/23 1856  NA 138  K 4.1  CL 109  CO2 20*  GLUCOSE 91  BUN  12  CREATININE 0.59*  CALCIUM 8.5*   GFR: Estimated Creatinine Clearance: 75.7 mL/min (A) (by C-G formula based on SCr of 0.59 mg/dL (L)). Liver Function Tests: Recent Labs  Lab 04/01/23 1856  AST 38  ALT 15  ALKPHOS 65  BILITOT 2.5*  PROT 6.3*  ALBUMIN 3.0*   No results for input(s): "LIPASE", "AMYLASE" in the last 168 hours. No results for input(s): "AMMONIA" in the last 168 hours. Coagulation Profile: Recent Labs  Lab 04/01/23 2310  INR 1.4*   Cardiac  Enzymes: No results for input(s): "CKTOTAL", "CKMB", "CKMBINDEX", "TROPONINI" in the last 168 hours. BNP (last 3 results) No results for input(s): "PROBNP" in the last 8760 hours. HbA1C: No results for input(s): "HGBA1C" in the last 72 hours. CBG: No results for input(s): "GLUCAP" in the last 168 hours. Lipid Profile: No results for input(s): "CHOL", "HDL", "LDLCALC", "TRIG", "CHOLHDL", "LDLDIRECT" in the last 72 hours. Thyroid Function Tests: No results for input(s): "TSH", "T4TOTAL", "FREET4", "T3FREE", "THYROIDAB" in the last 72 hours. Anemia Panel: No results for input(s): "VITAMINB12", "FOLATE", "FERRITIN", "TIBC", "IRON", "RETICCTPCT" in the last 72 hours. Sepsis Labs: No results for input(s): "PROCALCITON", "LATICACIDVEN" in the last 168 hours.  Recent Results (from the past 240 hour(s))  MRSA Next Gen by PCR, Nasal     Status: None   Collection Time: 04/01/23 11:00 PM   Specimen: Nasal Mucosa; Nasal Swab  Result Value Ref Range Status   MRSA by PCR Next Gen NOT DETECTED NOT DETECTED Final    Comment: (NOTE) The GeneXpert MRSA Assay (FDA approved for NASAL specimens only), is one component of a comprehensive MRSA colonization surveillance program. It is not intended to diagnose MRSA infection nor to guide or monitor treatment for MRSA infections. Test performance is not FDA approved in patients less than 71 years old. Performed at [facility], [address], [place], [place] [address]          Radiology Studies: DG Chest 1 View  Result Date: 04/01/2023 CLINICAL DATA:  Status post fall. EXAM: CHEST  1 VIEW COMPARISON:  March 26, 2018 FINDINGS: The heart size and mediastinal contours are within normal limits. Both lungs are clear. The visualized skeletal structures are unremarkable. IMPRESSION: No active disease. Electronically Signed   By: Aram Candela M.D.   On: 04/01/2023 18:57   DG HIP UNILAT WITH PELVIS 2-3 VIEWS RIGHT  Result Date:  04/01/2023 CLINICAL DATA:  Status post fall. EXAM: DG HIP (WITH OR WITHOUT PELVIS) 2-3V RIGHT COMPARISON:  None Available. FINDINGS: There is an acute fracture deformity extending through the neck of the proximal right femur. There is no evidence of dislocation. Mild degenerative changes are seen in the form of joint space narrowing and acetabular sclerosis. IMPRESSION: Acute fracture of the proximal right femur. Electronically Signed   By: Aram Candela M.D.   On: 04/01/2023 18:54   CT HEAD WO CONTRAST ( )  Result Date: 04/01/2023 CLINICAL DATA:  Head trauma, minor (Age >= 65y); Neck trauma (Age >= 65y) EXAM: CT HEAD WITHOUT CONTRAST CT CERVICAL SPINE WITHOUT CONTRAST TECHNIQUE: Multidetector CT imaging of the head and cervical spine was performed following the standard protocol without intravenous contrast. Multiplanar CT image reconstructions of the cervical spine were also generated. RADIATION DOSE REDUCTION: This exam was performed according to the departmental dose-optimization program which includes automated exposure control, adjustment of the mA and/or kV according to patient size and/or use of iterative reconstruction technique. COMPARISON:  None Available. FINDINGS: CT HEAD FINDINGS Brain:  No evidence of acute infarction, hemorrhage, hydrocephalus, extra-axial collection or mass lesion/mass effect. Moderate to severe chronic microvascular ischemic change with generalized volume loss. Vascular: No hyperdense vessel or unexpected calcification. Skull: Normal. Negative for fracture or focal lesion. Sinuses/Orbits: No middle ear or mastoid effusion. Paranasal sinuses are clear. Orbits are unremarkable. Other: None. CT CERVICAL SPINE FINDINGS Alignment: Straightening of the normal cervical lordosis. Trace retrolisthesis of C3 on C4 and C4 on C5. Skull base and vertebrae: No acute fracture. No primary bone lesion or focal pathologic process. Soft tissues and spinal canal: No prevertebral fluid or  swelling. No visible canal hematoma. 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 Other: Asymmetric medialization of the right vocal cord, nonspecific. Correlate for symptoms of dysphonia. IMPRESSION: 1. No acute intracranial abnormality. 2. No acute fracture or traumatic subluxation of the cervical spine. 3. 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 4. Asymmetric medialization of the right vocal cord, nonspecific. Correlate for symptoms of dysphonia. Electronically Signed   By: Lorenza Cambridge M.D.   On: 04/01/2023 18:31   CT Cervical Spine Wo Contrast  Result Date: 04/01/2023 CLINICAL DATA:  Head trauma, minor (Age >= 65y); Neck trauma (Age >= 65y) EXAM: CT HEAD WITHOUT CONTRAST CT CERVICAL SPINE WITHOUT CONTRAST TECHNIQUE: Multidetector CT imaging of the head and cervical spine was performed following the standard protocol without intravenous contrast. Multiplanar CT image reconstructions of the cervical spine were also generated. RADIATION DOSE REDUCTION: This exam was performed according to the departmental dose-optimization program which includes automated exposure control, adjustment of the mA and/or kV according to patient size and/or use of iterative reconstruction technique. COMPARISON:  None Available. FINDINGS: CT HEAD FINDINGS Brain: No evidence of acute infarction, hemorrhage, hydrocephalus, extra-axial collection or mass lesion/mass effect. Moderate to severe chronic microvascular ischemic change with generalized volume loss. Vascular: No hyperdense vessel or unexpected calcification. Skull: Normal. Negative for fracture or focal lesion. Sinuses/Orbits: No middle ear or mastoid effusion. Paranasal sinuses are clear. Orbits are  unremarkable. Other: None. CT CERVICAL SPINE FINDINGS Alignment: Straightening of the normal cervical lordosis. Trace retrolisthesis of C3 on C4 and C4 on C5. Skull base and vertebrae: No acute fracture. No primary bone lesion or focal pathologic process. Soft tissues and spinal canal: No prevertebral fluid or swelling. No visible canal hematoma. 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 Other: Asymmetric medialization of the right vocal cord, nonspecific. Correlate for symptoms of dysphonia. IMPRESSION: 1. No acute intracranial abnormality. 2. No acute fracture or traumatic subluxation of the cervical spine. 3. 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 4. Asymmetric medialization of the right vocal cord, nonspecific. Correlate for symptoms of dysphonia. Electronically Signed   By: Lorenza Cambridge M.D.   On: 04/01/2023 18:31        Scheduled Meds:  amLODipine  2.5 mg Oral Daily   carvedilol  3.125 mg Oral BID   lactulose  10 g Oral TID   lipase/protease/amylase  72,000 Units Oral TID WC   melatonin  3 mg Oral QHS   pantoprazole  40 mg Oral Daily   rifaximin  550  mg Oral BID   Continuous Infusions:  sodium chloride 100 mL/hr at 04/01/23 2259    ceFAZolin (ANCEF) IV       LOS: 1 day     Tresa Moore, MD [facility]   If 7PM-7AM, please contact night-coverage  04/02/2023, 11:20 AM

## 2023-04-02 NOTE — Anesthesia Postprocedure Evaluation (Signed)
Anesthesia Post Note  Patient: Joe [name]  Procedure(s) Performed: ARTHROPLASTY BIPOLAR HIP (HEMIARTHROPLASTY) (Right: Hip)  Patient location during evaluation: PACU Anesthesia Type: General Level of consciousness: awake and alert Pain management: pain level controlled Vital Signs Assessment: post-procedure vital signs reviewed and stable Respiratory status: spontaneous breathing, nonlabored ventilation, respiratory function stable and patient connected to nasal cannula oxygen Cardiovascular status: blood pressure returned to baseline and stable Postop Assessment: no apparent nausea or vomiting Anesthetic complications: no   No notable events documented.   Last Vitals:  Vitals:   04/02/23 1645 04/02/23 1700  BP: (!) 136/101 (!) 129/90  Pulse: 81 71  Resp: 16 13  Temp:    SpO2: 96% 93%    Last Pain:  Vitals:   04/02/23 1700  TempSrc:   PainSc: Asleep                 Louie Boston

## 2023-04-02 NOTE — TOC Progression Note (Signed)
Transition of Care [facility]) - Progression Note    Patient Details  Name: Joe [name] MRN: [id number] Date of Birth: [birth year]  [facility] of [facility] CM/SW Contact  Marlowe Sax, RN Phone Number: 04/02/2023, 1:42 PM  Clinical Narrative:    Spoke with the patient's daughter [name] I called [facility] and Spoke with Tammy and Almira Coaster The patient is a long term resident for the past 5 years at [facility] The bed hold for 3-5 days is 340$ per day, however they said that they will still have a bed when he is ready to return, it may not be the one he currently has unless he pays the bed hold which would hold his exact bed.  I explained to his daughter , she stated understanding         Expected Discharge Plan and Services                                               Social Determinants of Health (SDOH) Interventions SDOH Screenings   Food Insecurity: No Food Insecurity (04/01/2023)  Housing: Low Risk  (04/01/2023)  Transportation Needs: No Transportation Needs (04/01/2023)  Utilities: Not At Risk (04/01/2023)  Tobacco Use: High Risk (04/02/2023)    Readmission Risk Interventions     No data to display

## 2023-04-02 NOTE — Progress Notes (Signed)
Initial Nutrition Assessment  DOCUMENTATION CODES:   Not applicable  INTERVENTION:   -Once diet is advanced:  -Ensure Enlive po BID, each supplement provides 350 kcal and 20 grams of protein -MVI with minerals daily  NUTRITION DIAGNOSIS:   Increased nutrient needs related to post-op healing as evidenced by estimated needs.  GOAL:   Patient will meet greater than or equal to 90% of their needs  MONITOR:   PO intake, Supplement acceptance, Diet advancement  REASON FOR ASSESSMENT:   Consult Assessment of nutrition requirement/status  ASSESSMENT:   Pt with medical history significant for alcoholic liver disease, chronic pancreatitis, depression, COPD chronic portal vein thrombosis and PE as well as DVT of the right lower extremity on Xarelto, GERD, hypertension, who presented with acute onset of accidental mechanical fall earlier today at his SNF.  Pt admitted with closed rt hip fracture.   Reviewed I/O's: -200 ml x 24 hours   Case discussed with RN, who confirms pt is NPO for surgery today. Pt is from [facility].   Spoke with pt at bedside, who was pleasant and in good spirits today. Pt able to answer some questions appropriately. He reports he had a good appetite PTA and consumed 3 meals per day. He denies any chewing or swallowing difficulty. He is looking forward to eating lunch later today after surgery.   Reviewed wt hx; no wt loss noted over the past 2 years. Pt denies any weight loss, but unsure of UBW.   Discussed importance of good meal and supplement intake to promote healing. Pt amenable to supplements.   Medications reviewed and include lactulose, creon, xifaxan, and melatonin.   Labs reviewed: CBGS: 109.   NUTRITION - FOCUSED PHYSICAL EXAM:  Flowsheet Row Most Recent Value  Orbital Region Mild depletion  Upper Arm Region No depletion  Thoracic and Lumbar Region No depletion  Buccal Region No depletion  Temple Region Mild depletion  Clavicle  Bone Region No depletion  Clavicle and Acromion Bone Region No depletion  Scapular Bone Region No depletion  Dorsal Hand Mild depletion  Patellar Region Mild depletion  Anterior Thigh Region Mild depletion  Posterior Calf Region Mild depletion  Edema (RD Assessment) None  Hair Reviewed  Eyes Reviewed  Mouth Reviewed  Skin Reviewed  Nails Reviewed       Diet Order:   Diet Order             Diet NPO time specified Except for: Sips with Meds  Diet effective midnight                   EDUCATION NEEDS:   Education needs have been addressed  Skin:  Skin Assessment: Reviewed RN Assessment  Last BM:  Unknown  Height:   Ht Readings from Last 1 Encounters:  04/01/23 5\' 9"  (1.753 m)    Weight:   Wt Readings from Last 1 Encounters:  04/01/23 69.2 kg    Ideal Body Weight:  72.7 kg  BMI:  Body mass index is 22.52 kg/m.  Estimated Nutritional Needs:   Kcal:  1750-1950  Protein:  90-105 grams  Fluid:  > 1.7 L    Levada Schilling, RD, LDN, CDCES Registered Dietitian II Certified Diabetes Care and Education Specialist Please refer to [facility] for RD and/or RD on-call/weekend/after hours pager

## 2023-04-02 NOTE — Transfer of Care (Signed)
Immediate Anesthesia Transfer of Care Note  Patient: Joe [name]  Procedure(s) Performed: ARTHROPLASTY BIPOLAR HIP (HEMIARTHROPLASTY) (Right: Hip)  Patient Location: PACU  Anesthesia Type:General  Level of Consciousness: awake and drowsy  Airway & Oxygen Therapy: Patient Spontanous Breathing and Patient connected to face mask oxygen  Post-op Assessment: Report given to RN and Post -op Vital signs reviewed and stable  Post vital signs: Reviewed and stable  Last Vitals:  Vitals Value Taken Time  BP 163/101 04/02/23 1631  Temp 36.6 C 04/02/23 1628  Pulse 80 04/02/23 1638  Resp 18 04/02/23 1638  SpO2 100 % 04/02/23 1638  Vitals shown include unvalidated device data.  Last Pain:  Vitals:   04/02/23 1628  TempSrc:   PainSc: Asleep      Patients Stated Pain Goal: 0 (04/02/23 1313)  Complications: No notable events documented.

## 2023-04-02 NOTE — Anesthesia Procedure Notes (Signed)
Procedure Name: Intubation Date/Time: 04/02/2023 2:21 PM  Performed by: Morene Crocker, CRNAPre-anesthesia Checklist: Patient identified, Patient being monitored, Timeout performed, Emergency Drugs available and Suction available Patient Re-evaluated:Patient Re-evaluated prior to induction Oxygen Delivery Method: Circle system utilized Preoxygenation: Pre-oxygenation with 100% oxygen Induction Type: IV induction Ventilation: Mask ventilation without difficulty Laryngoscope Size: 3 and McGraph Grade View: Grade I Tube type: Oral Tube size: 7.0 mm Number of attempts: 1 Airway Equipment and Method: Stylet Placement Confirmation: ETT inserted through vocal cords under direct vision, positive ETCO2 and breath sounds checked- equal and bilateral Secured at: 22 cm Tube secured with: Tape Dental Injury: Teeth and Oropharynx as per pre-operative assessment  Comments: Smooth atraumatic intubation, no complications noted. Pt. Intubated in his bed for his comfort.

## 2023-04-02 NOTE — Plan of Care (Signed)

## 2023-04-02 NOTE — Plan of Care (Signed)
  Problem: Activity: Goal: Risk for activity intolerance will decrease Outcome: Progressing   Problem: Elimination: Goal: Will not experience complications related to bowel motility Outcome: Progressing Goal: Will not experience complications related to urinary retention Outcome: Progressing   Problem: Pain Managment: Goal: General experience of comfort will improve Outcome: Progressing   Problem: Safety: Goal: Ability to remain free from injury will improve Outcome: Progressing   Problem: Skin Integrity: Goal: Risk for impaired skin integrity will decrease Outcome: Progressing   

## 2023-04-02 NOTE — H&P (Signed)
H&P reviewed. No significant changes noted.  

## 2023-04-03 DIAGNOSIS — S72001D Fracture of unspecified part of neck of right femur, subsequent encounter for closed fracture with routine healing: Secondary | ICD-10-CM | POA: Diagnosis not present

## 2023-04-03 LAB — CBC
HCT: 29.6 % — ABNORMAL LOW (ref 39.0–52.0)
Hemoglobin: 9.4 g/dL — ABNORMAL LOW (ref 13.0–17.0)
MCH: 28.7 pg (ref 26.0–34.0)
MCHC: 31.8 g/dL (ref 30.0–36.0)
MCV: 90.2 fL (ref 80.0–100.0)
Platelets: 108 10*3/uL — ABNORMAL LOW (ref 150–400)
RBC: 3.28 MIL/uL — ABNORMAL LOW (ref 4.22–5.81)
RDW: 15.9 % — ABNORMAL HIGH (ref 11.5–15.5)
WBC: 11.1 10*3/uL — ABNORMAL HIGH (ref 4.0–10.5)
nRBC: 0 % (ref 0.0–0.2)

## 2023-04-03 LAB — BASIC METABOLIC PANEL
Anion gap: 10 (ref 5–15)
BUN: 22 mg/dL (ref 8–23)
CO2: 17 mmol/L — ABNORMAL LOW (ref 22–32)
Calcium: 7.8 mg/dL — ABNORMAL LOW (ref 8.9–10.3)
Chloride: 111 mmol/L (ref 98–111)
Creatinine, Ser: 1.26 mg/dL — ABNORMAL HIGH (ref 0.61–1.24)
GFR, Estimated: 59 mL/min — ABNORMAL LOW (ref >60)
Glucose, Bld: 105 mg/dL — ABNORMAL HIGH (ref 70–99)
Potassium: 3.5 mmol/L (ref 3.5–5.1)
Sodium: 138 mmol/L (ref 135–145)

## 2023-04-03 MED ORDER — METHOCARBAMOL 500 MG PO TABS
500.0000 mg | ORAL_TABLET | Freq: Three times a day (TID) | ORAL | Status: DC
  Administered 2023-04-03 – 2023-04-05 (×5): 500 mg via ORAL
  Filled 2023-04-03 (×6): qty 1

## 2023-04-03 NOTE — [place] FL2 (Signed)
[place] MEDICAID FL2 LEVEL OF CARE FORM     IDENTIFICATION  Patient Name: Joe [name] Birthdate: April 05, 1946 Sex: male Admission Date (Current Location): 04/01/2023  Ambulatory Care Center and [place] Number:  Chiropodist and Address:  [facility], [address], [place], [place] [address]      Provider Number: [id number]  Attending Physician Name and Address:  Tresa Moore, MD  Relative Name and Phone Number:  [facility] [phone]    Current Level of Care: Hospital Recommended Level of Care: Skilled Nursing Facility Prior Approval Number:    Date Approved/Denied:   PASRR Number: [id number]  Discharge Plan: SNF    Current Diagnoses: Patient Active Problem List   Diagnosis Date Noted   Closed right hip fracture (HCC) 04/01/2023   GERD without esophagitis 04/01/2023   Alcoholic liver disease (HCC) 04/01/2023   Chronic pancreatitis (HCC) 04/01/2023   Mild cognitive impairment 01/30/2022   Altered mental status 05/14/2018   Malnutrition of moderate degree 03/28/2018   Acute pancreatitis    Thrombocytopenia (HCC)    Acute deep vein thrombosis (DVT) of proximal vein of right lower extremity (HCC)    Acute on chronic pancreatitis (HCC) 03/19/2018   COPD (chronic obstructive pulmonary disease) (HCC) 04/29/2017   Essential hypertension 12/04/2014   Alcoholic cirrhosis of liver (HCC) 05/25/2011   Hyperlipidemia 01/13/2008    Orientation RESPIRATION BLADDER Height & Weight     Self, Situation  Normal External catheter, Incontinent Weight: 152 lb 8 oz (69.2 kg) Height:  5\' 9"  (175.3 cm)  BEHAVIORAL SYMPTOMS/MOOD NEUROLOGICAL BOWEL NUTRITION STATUS      Continent Diet (Ensure-Liquid )  AMBULATORY STATUS COMMUNICATION OF NEEDS Skin   Extensive Assist Verbally Surgical wounds (Right Hip)                       Personal Care Assistance Level of Assistance  Bathing, Feeding, Dressing Bathing Assistance: Maximum  assistance Feeding assistance: Independent Dressing Assistance: Maximum assistance     Functional Limitations Info  Sight, Speech, Hearing Sight Info: Adequate Hearing Info: Adequate Speech Info: Adequate    SPECIAL CARE FACTORS FREQUENCY  PT (By licensed PT), OT (By licensed OT)     PT Frequency: 5X Per week OT Frequency: 5X Per week            Contractures Contractures Info: Not present    Additional Factors Info  Code Status Code Status Info: DNR             Current Medications (04/03/2023):  This is the current hospital active medication list Current Facility-Administered Medications  Medication Dose Route Frequency Provider Last Rate Last Admin   0.9 %  sodium chloride infusion   Intravenous Continuous Signa Kell, MD 75 mL/hr at 04/02/23 1846 New Bag at 04/02/23 1846   acetaminophen (TYLENOL) tablet 1,000 mg  1,000 mg Oral Q8H Signa Kell, MD   1,000 mg at 04/03/23 6440   amLODipine (NORVASC) tablet 2.5 mg  2.5 mg Oral Daily Signa Kell, MD   2.5 mg at 04/03/23 3474   bisacodyl (DULCOLAX) suppository 10 mg  10 mg Rectal Daily PRN Signa Kell, MD       carvedilol (COREG) tablet 3.125 mg  3.125 mg Oral BID Signa Kell, MD   3.125 mg at 04/03/23 0906   docusate sodium (COLACE) capsule 100 mg  100 mg Oral BID Signa Kell, MD   100 mg at 04/03/23 0905   feeding supplement (ENSURE ENLIVE / ENSURE PLUS)  liquid 237 mL  237 mL Oral BID BM Signa Kell, MD       ketorolac (TORADOL) 15 MG/ML injection 7.5 mg  7.5 mg Intravenous Q6H Signa Kell, MD   7.5 mg at 04/03/23 0628   lactulose (CHRONULAC) 10 GM/15ML solution 10 g  10 g Oral TID Signa Kell, MD   10 g at 04/03/23 0903   lipase/protease/amylase (CREON) capsule 72,000 Units  72,000 Units Oral TID WC Signa Kell, MD   72,000 Units at 04/03/23 0908   melatonin tablet 5 mg  5 mg Oral QHS Orson Aloe, RPH   5 mg at 04/02/23 2305   menthol-cetylpyridinium (CEPACOL) lozenge 3 mg  1 lozenge Oral PRN Signa Kell, MD       Or   phenol (CHLORASEPTIC) mouth spray 1 spray  1 spray Mouth/Throat PRN Signa Kell, MD       methocarbamol (ROBAXIN) tablet 500 mg  500 mg Oral TID Lolita Patella B, MD       metoCLOPramide (REGLAN) tablet 5-10 mg  5-10 mg Oral Q8H PRN Signa Kell, MD       Or   metoCLOPramide (REGLAN) injection 5-10 mg  5-10 mg Intravenous Q8H PRN Signa Kell, MD       mirtazapine (REMERON) tablet 15 mg  15 mg Oral QHS Lolita Patella B, MD   15 mg at 04/02/23 2304   multivitamin with minerals tablet 1 tablet  1 tablet Oral Daily Signa Kell, MD   1 tablet at 04/03/23 0905   ondansetron (ZOFRAN) tablet 4 mg  4 mg Oral Q6H PRN Signa Kell, MD       Or   ondansetron [facility]) injection 4 mg  4 mg Intravenous Q6H PRN Signa Kell, MD       pantoprazole (PROTONIX) EC tablet 40 mg  40 mg Oral Daily Signa Kell, MD   40 mg at 04/03/23 0906   rifaximin (XIFAXAN) tablet 550 mg  550 mg Oral BID Signa Kell, MD   550 mg at 04/03/23 1610   rivaroxaban (XARELTO) tablet 20 mg  20 mg Oral Daily Signa Kell, MD   20 mg at 04/03/23 0908   senna (SENOKOT) tablet 8.6 mg  1 tablet Oral BID Signa Kell, MD   8.6 mg at 04/03/23 9604   senna-docusate (Senokot-S) tablet 1 tablet  1 tablet Oral QHS PRN Signa Kell, MD       sodium phosphate (FLEET) 7-19 GM/118ML enema 1 enema  1 enema Rectal Once PRN Signa Kell, MD       traMADol Janean Sark) tablet 50 mg  50 mg Oral Q6H PRN Signa Kell, MD   50 mg at 04/03/23 0906   traZODone (DESYREL) tablet 25 mg  25 mg Oral QHS PRN Signa Kell, MD         Discharge Medications: Please see discharge summary for a list of discharge medications.  Relevant Imaging Results:  Relevant Lab Results:   Additional Information [id number]  Colette Ribas, LCSWA

## 2023-04-03 NOTE — Plan of Care (Signed)
  Problem: Clinical Measurements: Goal: Will remain free from infection Outcome: Progressing   Problem: Safety: Goal: Ability to remain free from injury will improve Outcome: Progressing   Problem: Activity: Goal: Ability to avoid complications of mobility impairment will improve Outcome: Progressing Goal: Ability to tolerate increased activity will improve Outcome: Progressing   Problem: Clinical Measurements: Goal: Postoperative complications will be avoided or minimized Outcome: Progressing   Problem: Pain Management: Goal: Pain level will decrease with appropriate interventions Outcome: Progressing   Problem: Skin Integrity: Goal: Will show signs of wound healing Outcome: Progressing

## 2023-04-03 NOTE — Progress Notes (Signed)
An USGPIV (ultrasound guided PIV) has been placed for short-term vasopressor infusion. A correctly placed ivWatch must be used when administering Vasopressors. Should this treatment be needed beyond 72 hours, central line access should be obtained.  It will be the responsibility of the bedside nurse to follow best practice to prevent extravasations.   

## 2023-04-03 NOTE — Evaluation (Signed)
Occupational Therapy Evaluation Patient Details Name: Joe [name] MRN: [id number] DOB: [birth year] Today's Date: 04/03/2023   History of Present Illness Pt is a 77 yo male s/p R hip hemiarthroplasty. 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   Clinical Impression   Pt seen for OT evaluation this date.  Daughter present and able to provide PLOF; pt resided at LTC and requiring more assist with ADLs d/t cognition and frequent falls.  Cotx necessary d/t dementia with agitation.   Per family, pt responds better to females.  Pt tolerated standing at bedside only, max A of 2, highly anxious throughout.  Poor response to vc.  Responds to 1 step cues, hand over hand assist, and extra time.  Attempted forward step toward chair but pt was only able to slide/scoot feet minimally back and sideways to the bed.  Total A bed mobility.  Pt will benefit from skilled OT in the acute setting to maximize participation in basic self care and functional mobility.  Returned to supine with bed alarm set and daughter present in the room at end of session.      Recommendations for follow up therapy are one component of a multi-disciplinary discharge planning process, led by the attending physician.  Recommendations may be updated based on patient status, additional functional criteria and insurance authorization.   Assistance Recommended at Discharge Frequent or constant Supervision/Assistance  Patient can return home with the following Assistance with cooking/housework;Two people to help with walking and/or transfers;Two people to help with bathing/dressing/bathroom;Direct supervision/assist for medications management;Help with stairs or ramp for entrance;Assist for transportation    Functional Status Assessment  Patient has had a recent decline in their functional status  and demonstrates the ability to make significant improvements in function in a reasonable and predictable amount of time.  Equipment Recommendations   (defer to next venue)    Recommendations for Other Services       Precautions / Restrictions Precautions Precautions: Fall;Posterior Hip Precaution Booklet Issued: No Restrictions Weight Bearing Restrictions: Yes RLE Weight Bearing: Weight bearing as tolerated      Mobility Bed Mobility Overal bed mobility: Needs Assistance Bed Mobility: Supine to Sit, Sit to Supine     Supine to sit: Total assist, +2 for physical assistance Sit to supine: Total assist, +2 for physical assistance     Patient Response: Anxious, Restless (agitated)  Transfers Overall transfer level: Needs assistance Equipment used: Rolling walker (2 wheels) Transfers: Sit to/from Stand Sit to Stand: Total assist, +2 safety/equipment, From elevated surface           General transfer comment: extra time to proceess commands      Balance Overall balance assessment: Needs assistance Sitting-balance support: Feet supported, Bilateral upper extremity supported Sitting balance-Leahy Scale: Fair     Standing balance support: Reliant on assistive device for balance Standing balance-Leahy Scale: Poor                             ADL either performed or assessed with clinical judgement   ADL Overall ADL's : Needs assistance/impaired                                     Functional mobility during ADLs: Maximal assistance;+2 for physical  assistance General ADL Comments: UB ADLs at least min A to initiate activity and sequence steps d/t cognitive impairment, LB ADLs total assist     Vision   Additional Comments: unable to accurately assess d/t dementia     Perception     Praxis      Pertinent Vitals/Pain Pain Assessment Pain Assessment: Faces Faces Pain Scale: Hurts a little bit Pain Location: pt did not specify Pain  Descriptors / Indicators: Grimacing, Guarding, Moaning Pain Intervention(s): Limited activity within patient's tolerance, Monitored during session, Repositioned     Hand Dominance     Extremity/Trunk Assessment Upper Extremity Assessment Upper Extremity Assessment: Difficult to assess due to impaired cognition;Generalized weakness   Lower Extremity Assessment Lower Extremity Assessment: Generalized weakness;Difficult to assess due to impaired cognition       Communication Communication Communication: No difficulties   Cognition Arousal/Alertness: Awake/alert   Overall Cognitive Status: History of cognitive impairments - at baseline                                 General Comments: 1 step cues needed.  Family informed pt does better when utilizing "don't ask, tell" directions.     General Comments       Exercises     Shoulder Instructions      Home Living Family/patient expects to be discharged to:: Other (Comment) (LTC at [facility])                                        Prior Functioning/Environment               Mobility Comments: per family, pt was ambulatory without AD at baseline, able to ambulate to the bathroom on his own. has had an increase in falls recently ADLs Comments: per family, LTC recently moved patient's room closer to their nurses station d/t more frequently falls, 1-2/week, requiring more assist for basic self care        OT Problem List: Decreased strength;Decreased knowledge of use of DME or AE;Decreased knowledge of precautions;Decreased activity tolerance;Decreased cognition;Impaired balance (sitting and/or standing);Decreased safety awareness;Pain      OT Treatment/Interventions: Self-care/ADL training;Patient/family education;Balance training;Therapeutic activities;DME and/or AE instruction    OT Goals(Current goals can be found in the care plan section) Acute Rehab OT Goals Patient Stated Goal: Daughter  anticipates pt will return home to LTC OT Goal Formulation: With patient/family Time For Goal Achievement: 04/17/23 Potential to Achieve Goals: Good ADL Goals Pt Will Perform Grooming: with min assist;standing Pt Will Transfer to Toilet: with mod assist;bedside commode;with +2 assist Pt Will Perform Toileting - Clothing Manipulation and hygiene: with mod assist;with 2+ total assist;sit to/from stand  OT Frequency: Min 2X/week    Co-evaluation   Reason for Co-Treatment: For patient/therapist safety;To address functional/ADL transfers;Necessary to address cognition/behavior during functional activity PT goals addressed during session: Mobility/safety with mobility;Balance;Proper use of DME OT goals addressed during session: ADL's and self-care;Proper use of Adaptive equipment and DME      AM-PAC OT "6 Clicks" Daily Activity     Outcome Measure Help from another person eating meals?: A Little Help from another person taking care of personal grooming?: A Lot Help from another person toileting, which includes using toliet, bedpan, or urinal?: Total Help from another person bathing (including washing, rinsing, drying)?: Total Help from another person to  put on and taking off regular upper body clothing?: A Little Help from another person to put on and taking off regular lower body clothing?: Total 6 Click Score: 11   End of Session Equipment Utilized During Treatment: Gait belt;Rolling walker (2 wheels)  Activity Tolerance: Treatment limited secondary to agitation Patient left: in bed;with call bell/phone within reach;with bed alarm set;with family/visitor present  OT Visit Diagnosis: Other abnormalities of gait and mobility (R26.89);Repeated falls (R29.6);Muscle weakness (generalized) (M62.81);Other symptoms and signs involving cognitive function                Time: 1610-9604 OT Time Calculation (min): 23 min Charges:  OT General Charges $OT Visit: 1 Visit OT Evaluation $OT Eval  Moderate Complexity: 1 Mod  Danelle Earthly, MS, OTR/L [place] 04/03/2023, 12:52 PM

## 2023-04-03 NOTE — Progress Notes (Addendum)
   Subjective: 1 Day Post-Op Procedure(s) (LRB): ARTHROPLASTY BIPOLAR HIP (HEMIARTHROPLASTY) (Right) Patient reports pain as mild.   Patient is well, and has had no acute complaints or problems Denies any CP, SOB, ABD pain. We will continue therapy today.  Plan is to go Skilled nursing facility after hospital stay.  Objective: Vital signs in last 24 hours: Temp:  [97.5 F (36.4 C)-98.7 F (37.1 C)] 97.5 F (36.4 C) (06/15 0815) Pulse Rate:  [71-88] 82 (06/15 0815) Resp:  [13-20] 18 (06/15 0815) BP: (91-165)/(51-128) 116/51 (06/15 0815) SpO2:  [93 %-100 %] 93 % (06/15 0815) Weight:  [69.2 kg] 69.2 kg (06/14 1313)  Intake/Output from previous day: 06/14 0701 - 06/15 0700 In: 1600 [I.V.:1300; IV Piggyback:300] Out: 580 [Urine:280; Blood:300] Intake/Output this shift: No intake/output data recorded.  Recent Labs    04/01/23 1856  HGB 11.2*   Recent Labs    04/01/23 1856  WBC 5.9  RBC 3.95*  HCT 34.2*  PLT 95*   Recent Labs    04/01/23 1856  NA 138  K 4.1  CL 109  CO2 20*  BUN 12  CREATININE 0.59*  GLUCOSE 91  CALCIUM 8.5*   Recent Labs    04/01/23 2310  INR 1.4*    EXAM General - Patient is Alert, Appropriate, and Oriented Extremity - Neurovascular intact Sensation intact distally Intact pulses distally Dorsiflexion/Plantar flexion intact Knee immobilizer intact.  Compartments soft. Dressing - dressing C/D/I and scant drainage Motor Function - intact, moving foot and toes well on exam.   Past Medical History:  Diagnosis Date   Alcoholic liver disease (HCC)    Cerebral infarction (HCC)    Chronic pancreatitis (HCC)    Depression    DVT, lower extremity (HCC)    Dysphagia, oropharyngeal phase    GERD (gastroesophageal reflux disease)    Hypertension    Iron deficiency anemia    Muscle weakness (generalized)    Pancreatitis    Patient denies medical problems    Personal history of transient ischemic attack    Thrombocytopenia, unspecified  (HCC)    Vitamin D deficiency, unspecified     Assessment/Plan:   1 Day Post-Op Procedure(s) (LRB): ARTHROPLASTY BIPOLAR HIP (HEMIARTHROPLASTY) (Right) Principal Problem:   Closed right hip fracture (HCC) Active Problems:   Essential hypertension   GERD without esophagitis   Alcoholic liver disease (HCC)   Chronic pancreatitis (HCC)  Estimated body mass index is 22.52 kg/m as calculated from the following:   Height as of this encounter: 5\' 9"  (1.753 m).   Weight as of this encounter: 69.2 kg. Advance diet Up with therapy, weightbearing as tolerated Pain well-controlled Labs and vital signs are stable Care management to assist with discharge to skilled nursing facility.  DVT Prophylaxis - Xarelto, TED hose, and SCDs Weight-Bearing as tolerated to right leg   T. Cranston Neighbor, PA-C [facility] 04/03/2023, 9:31 AM   Patient seen and examined, agree with above plan.  The patient is doing well status post right hip hemiarthroplasty, no concerns at this time.  Pain is controlled.  Discussed DVT prophylaxis, pain medication use, and safe transition to SNF.  All questions answered the patient agrees with above plan.   Reinaldo Berber MD

## 2023-04-03 NOTE — Plan of Care (Signed)
  Problem: Education: Goal: Knowledge of General Education information will improve Description: Including pain rating scale, medication(s)/side effects and non-pharmacologic comfort measures Outcome: Not Progressing   Problem: Clinical Measurements: Goal: Ability to maintain clinical measurements within normal limits will improve Outcome: Not Progressing Goal: Will remain free from infection Outcome: Not Progressing   

## 2023-04-03 NOTE — Progress Notes (Signed)
PROGRESS NOTE    Joe [name]  NWG:[id number] DOB: [birth year] DOA: 04/01/2023 PCP: Rosetta Posner, MD    Brief Narrative:  77 y.o. 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  He usually ambulates with a walker.  He noted acute onset of right hip pain with inability to ambulate after his fall.  He stated that he possibly hit his head with no injuries or loss of consciousness.  No paresthesias or focal muscle weakness.  He denies any nausea or vomiting.  No chest pain or palpitations.  No cough or wheezing or dyspnea.  No dysuria, oliguria or hematuria or flank pain.  No fever or chills.  His last Xarelto dose was yesterday morning.  He is overall a poor historian likely due to underlying dementia   Assessment & Plan:   Principal Problem:   Closed right hip fracture (HCC) Active Problems:   Essential hypertension   GERD without esophagitis   Alcoholic liver disease (HCC)   Chronic pancreatitis (HCC)  * Closed right hip fracture [facility]) Orthopedics consulted from ED.  Plan for surgical management. Status post right hip hemiarthroplasty on 6/14 Tolerated procedure well.  No immediate complications Plan: Okay to resume Xarelto for DVT prophylaxis.  PT OT to start today.  Bowel regimen.  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  Much more awake and alert this morning.  Not agitated.  Pain well-controlled  Objective: Vitals:   04/02/23 2236 04/03/23 0222 04/03/23 0513 04/03/23 0815  BP: (!) 103/57 107/69 109/63 (!) 116/51  Pulse: 72 79 78 82  Resp: 20 18 18 18   Temp: 97.8 F (36.6 C) 98.2 F (36.8 C) 98.7 F (37.1 C) (!) 97.5 F (36.4 C)  TempSrc:      SpO2: 99% 93% 94% 93%  Weight:      Height:        Intake/Output Summary (Last 24 hours) at 04/03/2023 1128 Last data filed at 04/03/2023 1005 Gross per 24 hour  Intake 1720 ml  Output 430 ml  Net 1290 ml   Filed Weights   04/01/23 1729 04/02/23 1313  Weight: 69.2 kg 69.2 kg    Examination:  General exam: No acute distress.  Feels well Respiratory system: Clear to auscultation. Respiratory effort normal. Cardiovascular system: S1-S2, RRR, no murmurs, no pedal edema Gastrointestinal system: Abdomen is nondistended, soft and nontender. No organomegaly or masses felt. Normal bowel sounds heard. Central nervous system: Alert and oriented. No focal neurological deficits. Extremities: Right hip surgical dressing CDI Skin: No rashes, lesions or ulcers Psychiatry: Judgement and insight appear impaired. Mood & affect intact.     Data Reviewed: I have personally reviewed following labs and imaging studies  CBC: Recent Labs  Lab 04/01/23 1856  04/03/23 0931  WBC 5.9 11.1*  HGB 11.2* 9.4*  HCT 34.2* 29.6*  MCV 86.6 90.2  PLT 95* 108*   Basic Metabolic Panel: Recent Labs  Lab 04/01/23 1856 04/03/23 0931  NA 138 138  K 4.1 3.5  CL 109 111  CO2 20* 17*  GLUCOSE 91 105*  BUN 12 22  CREATININE 0.59* 1.26*  CALCIUM 8.5* 7.8*   GFR: Estimated Creatinine Clearance: 48.1 mL/min (A) (by C-G formula based on SCr of 1.26 mg/dL  (H)). Liver Function Tests: Recent Labs  Lab 04/01/23 1856  AST 38  ALT 15  ALKPHOS 65  BILITOT 2.5*  PROT 6.3*  ALBUMIN 3.0*   No results for input(s): "LIPASE", "AMYLASE" in the last 168 hours. No results for input(s): "AMMONIA" in the last 168 hours. Coagulation Profile: Recent Labs  Lab 04/01/23 2310  INR 1.4*   Cardiac Enzymes: No results for input(s): "CKTOTAL", "CKMB", "CKMBINDEX", "TROPONINI" in the last 168 hours. BNP (last 3 results) No results for input(s): "PROBNP" in the last 8760 hours. HbA1C: No results for input(s): "HGBA1C" in the last 72 hours. CBG: No results for input(s): "GLUCAP" in the last 168 hours. Lipid Profile: No results for input(s): "CHOL", "HDL", "LDLCALC", "TRIG", "CHOLHDL", "LDLDIRECT" in the last 72 hours. Thyroid Function Tests: No results for input(s): "TSH", "T4TOTAL", "FREET4", "T3FREE", "THYROIDAB" in the last 72 hours. Anemia Panel: No results for input(s): "VITAMINB12", "FOLATE", "FERRITIN", "TIBC", "IRON", "RETICCTPCT" in the last 72 hours. Sepsis Labs: No results for input(s): "PROCALCITON", "LATICACIDVEN" in the last 168 hours.  Recent Results (from the past 240 hour(s))  MRSA Next Gen by PCR, Nasal     Status: None   Collection Time: 04/01/23 11:00 PM   Specimen: Nasal Mucosa; Nasal Swab  Result Value Ref Range Status   MRSA by PCR Next Gen NOT DETECTED NOT DETECTED Final    Comment: (NOTE) The GeneXpert MRSA Assay (FDA approved for NASAL specimens only), is one component of a comprehensive MRSA colonization surveillance program. It is not intended to diagnose MRSA infection nor to guide or monitor treatment for MRSA infections. Test performance is not FDA approved in patients less than 79 years old. Performed at [facility], [address] [place], [place] [address]          Radiology Studies: DG Pelvis Portable  Result Date: 04/02/2023 CLINICAL DATA:  Status right post hip hemiarthroplasty. EXAM:  PORTABLE PELVIS 1-2 VIEWS COMPARISON:  Intraoperative AP view of the bilateral hips 04/02/2023; Pelvis and right hip radiographs 04/01/2023 FINDINGS: Interval right hip hemiarthroplasty. Expected postoperative changes including intra-articular and lateral thigh subcutaneous air. Lateral right hip surgical skin staples. The left femoroacetabular joint space is maintained. Mild-to-moderate atherosclerotic calcifications. A Foley catheter is noted. IMPRESSION: Interval right hip hemiarthroplasty with expected postoperative changes. Electronically Signed   By: Neita Garnet M.D.   On: 04/02/2023 18:05   DG Pelvis Portable  Result Date: 04/02/2023 CLINICAL DATA:  Fracture. Intraoperative film for right hemiarthroplasty EXAM: PORTABLE PELVIS 1-2 VIEWS COMPARISON:  CT 10/25/2019.  Pelvis and hips radiographs 04/01/2023 FINDINGS: Intraoperative localization image demonstrates placement of a femoral portion of a right hemiarthroplasty. Component appears well seated. Soft tissue gas is consistent with intraoperative procedure. The femoral head has been resected in the interval. IMPRESSION: Intraoperative localization for right hip hemiarthroplasty. Electronically Signed   By: Burman Nieves M.D.   On: 04/02/2023 15:42   DG Chest 1 View  Result Date: 04/01/2023 CLINICAL DATA:  Status post fall. EXAM: CHEST  1 VIEW COMPARISON:  March 26, 2018 FINDINGS: The heart size and mediastinal contours are within normal limits. Both lungs are clear. The visualized skeletal structures are unremarkable. IMPRESSION: No active disease. Electronically Signed   By: Aram Candela M.D.   On: 04/01/2023 18:57   DG HIP UNILAT WITH PELVIS 2-3 VIEWS RIGHT  Result Date: 04/01/2023 CLINICAL DATA:  Status post fall. EXAM: DG HIP (WITH OR WITHOUT PELVIS) 2-3V RIGHT COMPARISON:  None Available. FINDINGS: There is an acute fracture deformity extending through the neck of the proximal right femur. There is no evidence of dislocation. Mild  degenerative changes are seen in the form of joint space narrowing and acetabular sclerosis. IMPRESSION: Acute fracture of the proximal right femur. Electronically Signed   By: Aram Candela M.D.   On: 04/01/2023 18:54   CT HEAD WO CONTRAST ( )  Result Date: 04/01/2023 CLINICAL DATA:  Head trauma, minor (Age >= 65y); Neck trauma (Age >= 65y) EXAM: CT HEAD WITHOUT CONTRAST CT CERVICAL SPINE WITHOUT CONTRAST TECHNIQUE: Multidetector CT imaging of the head and cervical spine was performed following the standard protocol without intravenous contrast. Multiplanar CT image reconstructions of the cervical spine were also generated. RADIATION DOSE REDUCTION: This exam was performed according to the departmental dose-optimization program which includes automated exposure control, adjustment of the mA and/or kV according to patient size and/or use of iterative reconstruction technique. COMPARISON:  None Available. FINDINGS: CT HEAD FINDINGS Brain: No evidence of acute infarction, hemorrhage, hydrocephalus, extra-axial collection or mass lesion/mass effect. Moderate to severe chronic microvascular ischemic change with generalized volume loss. Vascular: No hyperdense vessel or unexpected calcification. Skull: Normal. Negative for fracture or focal lesion. Sinuses/Orbits: No middle ear or mastoid effusion. Paranasal sinuses are clear. Orbits are unremarkable. Other: None. CT CERVICAL SPINE FINDINGS Alignment: Straightening of the normal cervical lordosis. Trace retrolisthesis of C3 on C4 and C4 on C5. Skull base and vertebrae: No acute fracture. No primary bone lesion or focal pathologic process. Soft tissues and spinal canal: No prevertebral fluid or swelling. No visible canal hematoma. 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 Other:  Asymmetric medialization of the right vocal cord, nonspecific. Correlate for symptoms of dysphonia. IMPRESSION: 1. No acute intracranial abnormality. 2. No acute fracture or traumatic subluxation of the cervical spine. 3. 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 4. Asymmetric medialization of the right vocal cord, nonspecific. Correlate for symptoms of dysphonia. Electronically Signed   By: Lorenza Cambridge M.D.   On: 04/01/2023 18:31   CT Cervical Spine Wo Contrast  Result Date: 04/01/2023 CLINICAL DATA:  Head trauma, minor (Age >= 65y); Neck trauma (Age >= 65y) EXAM: CT HEAD WITHOUT CONTRAST CT CERVICAL SPINE WITHOUT CONTRAST TECHNIQUE: Multidetector CT imaging of the head and cervical spine was performed following the standard protocol without intravenous contrast. Multiplanar CT image reconstructions of the cervical spine were also generated. RADIATION DOSE REDUCTION: This exam was performed according to the departmental dose-optimization program which includes automated exposure control, adjustment of the mA and/or kV according to patient size and/or use of iterative reconstruction technique. COMPARISON:  None Available. FINDINGS: CT HEAD FINDINGS Brain: No evidence of acute infarction, hemorrhage, hydrocephalus, extra-axial collection or mass lesion/mass effect. Moderate to severe chronic microvascular ischemic change with generalized volume loss. Vascular: No hyperdense vessel or unexpected  calcification. Skull: Normal. Negative for fracture or focal lesion. Sinuses/Orbits: No middle ear or mastoid effusion. Paranasal sinuses are clear. Orbits are unremarkable. Other: None. CT CERVICAL SPINE FINDINGS Alignment: Straightening of the normal cervical lordosis. Trace retrolisthesis of C3 on C4 and C4 on C5. Skull base and vertebrae: No acute fracture. No primary bone lesion or focal pathologic process. Soft  tissues and spinal canal: No prevertebral fluid or swelling. No visible canal hematoma. 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 Other: Asymmetric medialization of the right vocal cord, nonspecific. Correlate for symptoms of dysphonia. IMPRESSION: 1. No acute intracranial abnormality. 2. No acute fracture or traumatic subluxation of the cervical spine. 3. 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 4. Asymmetric medialization of the right vocal cord, nonspecific. Correlate for symptoms of dysphonia. Electronically Signed   By: Lorenza Cambridge M.D.   On: 04/01/2023 18:31        Scheduled Meds:  acetaminophen  1,000 mg Oral Q8H   amLODipine  2.5 mg Oral Daily   carvedilol  3.125 mg Oral BID   docusate sodium  100 mg Oral BID   feeding supplement  237 mL Oral BID BM   ketorolac  7.5 mg Intravenous Q6H   lactulose  10 g Oral TID   lipase/protease/amylase  72,000 Units Oral TID WC   melatonin  5 mg Oral QHS   mirtazapine  15 mg Oral QHS   multivitamin with minerals  1 tablet Oral Daily   pantoprazole  40 mg Oral Daily   rifaximin  550 mg Oral BID   rivaroxaban  20 mg Oral Daily   senna  1 tablet Oral BID   Continuous Infusions:  sodium chloride 75 mL/hr at 04/02/23 1846     LOS: 2 days     Tresa Moore, MD [facility]   If 7PM-7AM, please contact night-coverage  04/03/2023, 11:28 AM

## 2023-04-03 NOTE — Evaluation (Signed)
Physical Therapy Evaluation Patient Details Name: Joe [name] MRN: [id number] DOB: 1946/07/11 Today's Date: 04/03/2023  History of Present Illness  Pt is a 77 yo male s/p R hip hemiarthroplasty. 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   Clinical Impression  Pt oriented to name, per family at bedside he is a resident at [facility] long term. Previously ambulatory with AD, but has had an increase in falls lately. Pt intermittently agitated when overwhelmed or over stimulated, but redirectable. Extra time to increase pt participation. Ultimately totalAx2 for all mobility tasks. Pt unable/unwilling to truly ambulate but with time and facilitation did take a few steps backwards towards EOB And to the R. KI donned throughout session.  Overall the patient demonstrated deficits (see "PT Problem List") that impede the patient's functional abilities, safety, and mobility and would benefit from skilled PT intervention. Recommendation is to continue skilled PT services as able to maximize pt function and independence.        Recommendations for follow up therapy are one component of a multi-disciplinary discharge planning process, led by the attending physician.  Recommendations may be updated based on patient status, additional functional criteria and insurance authorization.  Follow Up Recommendations       Assistance Recommended at Discharge Frequent or constant Supervision/Assistance  Patient can return home with the following  Two people to help with walking and/or transfers;Two people to help with bathing/dressing/bathroom;Assistance with cooking/housework;Assist for transportation;Direct supervision/assist for medications management;Assistance with feeding;Help with stairs or ramp for entrance    Equipment Recommendations Other (comment) (TBD at next  venue of care)  Recommendations for Other Services       Functional Status Assessment Patient has had a recent decline in their functional status and demonstrates the ability to make significant improvements in function in a reasonable and predictable amount of time.     Precautions / Restrictions Precautions Precautions: Fall;Posterior Hip Precaution Booklet Issued: No Restrictions Weight Bearing Restrictions: Yes RLE Weight Bearing: Weight bearing as tolerated      Mobility  Bed Mobility Overal bed mobility: Needs Assistance Bed Mobility: Supine to Sit, Sit to Supine     Supine to sit: Total assist, +2 for physical assistance Sit to supine: Total assist, +2 for physical assistance        Transfers Overall transfer level: Needs assistance Equipment used: Rolling walker (2 wheels) Transfers: Sit to/from Stand Sit to Stand: Total assist, +2 safety/equipment, From elevated surface                Ambulation/Gait               General Gait Details: pt unable to truly take any steps this session but did take mini shuffled steps back towards the bed, and 1-2 steps to the R with modA for LLE management  Stairs            Wheelchair Mobility    Modified Rankin (Stroke Patients Only)       Balance Overall balance assessment: Needs assistance Sitting-balance support: Feet supported Sitting balance-Leahy Scale: Fair Sitting balance - Comments: able to sit with BLE on ground once repositioned in midline   Standing balance support: Reliant on assistive device for balance Standing balance-Leahy Scale: Poor Standing balance comment: able to somewhat support his own weight in static standing  Pertinent Vitals/Pain Pain Assessment Pain Assessment: Faces Faces Pain Scale: Hurts a little bit Pain Location: pt did not specify Pain Descriptors / Indicators: Grimacing, Guarding, Moaning Pain Intervention(s): Limited  activity within patient's tolerance, Monitored during session, Repositioned    Home Living Family/patient expects to be discharged to:: Other (Comment) (LTC at [facility])                        Prior Function               Mobility Comments: per family, pt was ambulatory without AD at baseline, able to ambulate to the bathroom on his own. has had an increase in falls recently       Hand Dominance        Extremity/Trunk Assessment   Upper Extremity Assessment Upper Extremity Assessment: Generalized weakness;Difficult to assess due to impaired cognition    Lower Extremity Assessment Lower Extremity Assessment: Generalized weakness;Difficult to assess due to impaired cognition       Communication      Cognition Arousal/Alertness: Awake/alert   Overall Cognitive Status: History of cognitive impairments - at baseline                                 General Comments: pt needed a lot of redirection to minimize agitation, distraction, and encouragement to stay on task        General Comments      Exercises     Assessment/Plan    PT Assessment Patient needs continued PT services  PT Problem List Decreased strength;Decreased mobility;Decreased knowledge of precautions;Decreased activity tolerance;Decreased balance;Decreased knowledge of use of DME       PT Treatment Interventions Therapeutic activities;DME instruction;Gait training;Therapeutic exercise;Patient/family education;Stair training;Balance training;Functional mobility training;Neuromuscular re-education    PT Goals (Current goals can be found in the Care Plan section)  Acute Rehab PT Goals Patient Stated Goal: return to PLOF as able PT Goal Formulation: With family Time For Goal Achievement: 04/17/23 Potential to Achieve Goals: Fair    Frequency Min 4X/week     Co-evaluation PT/OT/SLP Co-Evaluation/Treatment: Yes Reason for Co-Treatment: For patient/therapist safety;To address  functional/ADL transfers;Necessary to address cognition/behavior during functional activity PT goals addressed during session: Mobility/safety with mobility;Balance;Proper use of DME OT goals addressed during session: ADL's and self-care;Proper use of Adaptive equipment and DME       AM-PAC PT "6 Clicks" Mobility  Outcome Measure Help needed turning from your back to your side while in a flat bed without using bedrails?: Total Help needed moving from lying on your back to sitting on the side of a flat bed without using bedrails?: Total Help needed moving to and from a bed to a chair (including a wheelchair)?: Total Help needed standing up from a chair using your arms (e.g., wheelchair or bedside chair)?: Total Help needed to walk in hospital room?: Total Help needed climbing 3-5 steps with a railing? : Total 6 Click Score: 6    End of Session Equipment Utilized During Treatment: Gait belt;Right knee immobilizer Activity Tolerance: Other (comment) (limited by pt cognition) Patient left: in bed;with call bell/phone within reach;with bed alarm set Nurse Communication: Mobility status PT Visit Diagnosis: Other abnormalities of gait and mobility (R26.89);Muscle weakness (generalized) (M62.81);Difficulty in walking, not elsewhere classified (R26.2)    Time: 4540-9811 PT Time Calculation (min) (ACUTE ONLY): 23 min   Charges:   PT Evaluation $PT Eval Moderate Complexity:  1 Mod PT Treatments $Therapeutic Activity: 8-22 mins        Olga Coaster PT, DPT 11:53 AM,04/03/23

## 2023-04-03 NOTE — TOC Progression Note (Signed)
Transition of Care [facility]) - Progression Note    Patient Details  Name: Joe [name] MRN: [id number] Date of Birth: 1946-04-07  [facility]) CM/SW Contact  Colette Ribas, [place] Phone Number: 04/03/2023, 2:34 PM  Clinical Narrative:     CSW left message for [facility] for patient needing rehab upon return. Submitted SNF request. Pending Ins Auth (Expected discharge 6/17).        Expected Discharge Plan and Services                                               Social Determinants of Health (SDOH) Interventions SDOH Screenings   Food Insecurity: No Food Insecurity (04/01/2023)  Housing: Low Risk  (04/01/2023)  Transportation Needs: No Transportation Needs (04/01/2023)  Utilities: Not At Risk (04/01/2023)  Tobacco Use: High Risk (04/02/2023)    Readmission Risk Interventions     No data to display

## 2023-04-04 DIAGNOSIS — S72001D Fracture of unspecified part of neck of right femur, subsequent encounter for closed fracture with routine healing: Secondary | ICD-10-CM | POA: Diagnosis not present

## 2023-04-04 LAB — BASIC METABOLIC PANEL
Anion gap: 6 (ref 5–15)
BUN: 30 mg/dL — ABNORMAL HIGH (ref 8–23)
CO2: 20 mmol/L — ABNORMAL LOW (ref 22–32)
Calcium: 8.3 mg/dL — ABNORMAL LOW (ref 8.9–10.3)
Chloride: 117 mmol/L — ABNORMAL HIGH (ref 98–111)
Creatinine, Ser: 1.17 mg/dL (ref 0.61–1.24)
GFR, Estimated: >60 mL/min (ref >60)
Glucose, Bld: 102 mg/dL — ABNORMAL HIGH (ref 70–99)
Potassium: 3.7 mmol/L (ref 3.5–5.1)
Sodium: 143 mmol/L (ref 135–145)

## 2023-04-04 LAB — CBC
HCT: 24.1 % — ABNORMAL LOW (ref 39.0–52.0)
Hemoglobin: 7.8 g/dL — ABNORMAL LOW (ref 13.0–17.0)
MCH: 28.6 pg (ref 26.0–34.0)
MCHC: 32.4 g/dL (ref 30.0–36.0)
MCV: 88.3 fL (ref 80.0–100.0)
Platelets: 95 10*3/uL — ABNORMAL LOW (ref 150–400)
RBC: 2.73 MIL/uL — ABNORMAL LOW (ref 4.22–5.81)
RDW: 15.9 % — ABNORMAL HIGH (ref 11.5–15.5)
WBC: 7.9 10*3/uL (ref 4.0–10.5)
nRBC: 0 % (ref 0.0–0.2)

## 2023-04-04 LAB — GLUCOSE, CAPILLARY
Glucose-Capillary: 100 mg/dL — ABNORMAL HIGH (ref 70–99)
Glucose-Capillary: 90 mg/dL (ref 70–99)

## 2023-04-04 MED ORDER — FE FUM-VIT C-VIT B12-FA 460-60-0.01-1 MG PO CAPS
1.0000 | ORAL_CAPSULE | Freq: Every day | ORAL | Status: DC
  Administered 2023-04-04 – 2023-04-05 (×2): 1 via ORAL
  Filled 2023-04-04 (×2): qty 1

## 2023-04-04 NOTE — Progress Notes (Signed)
PROGRESS NOTE    Joe [name]  GNF:[id number] DOB: 05-06-1946 DOA: 04/01/2023 PCP: Rosetta Posner, MD    Brief Narrative:  77 y.o. 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  He usually ambulates with a walker.  He noted acute onset of right hip pain with inability to ambulate after his fall.  He stated that he possibly hit his head with no injuries or loss of consciousness.  No paresthesias or focal muscle weakness.  He denies any nausea or vomiting.  No chest pain or palpitations.  No cough or wheezing or dyspnea.  No dysuria, oliguria or hematuria or flank pain.  No fever or chills.  His last Xarelto dose was yesterday morning.  He is overall a poor historian likely due to underlying dementia   Assessment & Plan:   Principal Problem:   Closed right hip fracture (HCC) Active Problems:   Essential hypertension   GERD without esophagitis   Alcoholic liver disease (HCC)   Chronic pancreatitis (HCC)  * Closed right hip fracture [facility]) Orthopedics consulted from ED.  Plan for surgical management. Status post right hip hemiarthroplasty on 6/14 Tolerated procedure well.  No immediate complications Plan: Continue Xarelto for DVT prophylaxis.  Continue PT OT.  Bowel regimen.  Having bowel movements.  Anticipate medical readiness for discharge 6/17.  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  Awake and alert.  No visible distress.  Pain well-controlled.  Objective: Vitals:   04/03/23 1706 04/03/23 2253 04/04/23 0827 04/04/23 1050  BP: 102/61 108/69 (!) 109/55   Pulse: 77 87 76   Resp: 16 20 16    Temp: 98.5 F (36.9 C) 98.8 F (37.1 C) 98.1 F (36.7 C)   TempSrc:      SpO2: 95% 94% 100% 100%  Weight:      Height:        Intake/Output Summary (Last 24 hours) at 04/04/2023 1146 Last data filed at 04/04/2023 0832 Gross per 24 hour  Intake 526.39 ml  Output 350 ml  Net 176.39 ml   Filed Weights   04/01/23 1729 04/02/23 1313  Weight: 69.2 kg 69.2 kg    Examination:  General exam: NAD Respiratory system: Lungs clear.  No work of breathing.  Room air Cardiovascular system: S1-S2, RRR, no murmurs, no pedal edema Gastrointestinal system: Abdomen is nondistended, soft and nontender. No organomegaly or masses felt. Normal bowel sounds heard. Central nervous system: Alert and oriented. No focal neurological deficits. Extremities: Right hip surgical dressing CDI Skin: No rashes, lesions or ulcers Psychiatry: Judgement and insight appear impaired. Mood & affect intact.     Data Reviewed: I have personally reviewed following labs and imaging studies  CBC: Recent Labs  Lab 04/01/23 1856 04/03/23 0931 04/04/23 0818  WBC  5.9 11.1* 7.9  HGB 11.2* 9.4* 7.8*  HCT 34.2* 29.6* 24.1*  MCV 86.6 90.2 88.3  PLT 95* 108* 95*   Basic Metabolic Panel: Recent Labs  Lab 04/01/23 1856 04/03/23 0931 04/04/23 0818  NA 138 138 143  K 4.1 3.5 3.7  CL 109 111 117*  CO2 20* 17* 20*  GLUCOSE 91 105* 102*  BUN 12 22 30*  CREATININE 0.59* 1.26* 1.17  CALCIUM 8.5* 7.8* 8.3*   GFR: Estimated Creatinine Clearance: 51.8  mL/min (by C-G formula based on SCr of 1.17 mg/dL). Liver Function Tests: Recent Labs  Lab 04/01/23 1856  AST 38  ALT 15  ALKPHOS 65  BILITOT 2.5*  PROT 6.3*  ALBUMIN 3.0*   No results for input(s): "LIPASE", "AMYLASE" in the last 168 hours. No results for input(s): "AMMONIA" in the last 168 hours. Coagulation Profile: Recent Labs  Lab 04/01/23 2310  INR 1.4*   Cardiac Enzymes: No results for input(s): "CKTOTAL", "CKMB", "CKMBINDEX", "TROPONINI" in the last 168 hours. BNP (last 3 results) No results for input(s): "PROBNP" in the last 8760 hours. HbA1C: No results for input(s): "HGBA1C" in the last 72 hours. CBG: No results for input(s): "GLUCAP" in the last 168 hours. Lipid Profile: No results for input(s): "CHOL", "HDL", "LDLCALC", "TRIG", "CHOLHDL", "LDLDIRECT" in the last 72 hours. Thyroid Function Tests: No results for input(s): "TSH", "T4TOTAL", "FREET4", "T3FREE", "THYROIDAB" in the last 72 hours. Anemia Panel: No results for input(s): "VITAMINB12", "FOLATE", "FERRITIN", "TIBC", "IRON", "RETICCTPCT" in the last 72 hours. Sepsis Labs: No results for input(s): "PROCALCITON", "LATICACIDVEN" in the last 168 hours.  Recent Results (from the past 240 hour(s))  MRSA Next Gen by PCR, Nasal     Status: None   Collection Time: 04/01/23 11:00 PM   Specimen: Nasal Mucosa; Nasal Swab  Result Value Ref Range Status   MRSA by PCR Next Gen NOT DETECTED NOT DETECTED Final    Comment: (NOTE) The GeneXpert MRSA Assay (FDA approved for NASAL specimens only), is one component of a comprehensive MRSA colonization surveillance program. It is not intended to diagnose MRSA infection nor to guide or monitor treatment for MRSA infections. Test performance is not FDA approved in patients less than 41 years old. Performed at [facility], [address] [place], [place] [address]          Radiology Studies: DG Pelvis Portable  Result Date: 04/02/2023 CLINICAL DATA:   Status right post hip hemiarthroplasty. EXAM: PORTABLE PELVIS 1-2 VIEWS COMPARISON:  Intraoperative AP view of the bilateral hips 04/02/2023; Pelvis and right hip radiographs 04/01/2023 FINDINGS: Interval right hip hemiarthroplasty. Expected postoperative changes including intra-articular and lateral thigh subcutaneous air. Lateral right hip surgical skin staples. The left femoroacetabular joint space is maintained. Mild-to-moderate atherosclerotic calcifications. A Foley catheter is noted. IMPRESSION: Interval right hip hemiarthroplasty with expected postoperative changes. Electronically Signed   By: Neita Garnet M.D.   On: 04/02/2023 18:05   DG Pelvis Portable  Result Date: 04/02/2023 CLINICAL DATA:  Fracture. Intraoperative film for right hemiarthroplasty EXAM: PORTABLE PELVIS 1-2 VIEWS COMPARISON:  CT 10/25/2019.  Pelvis and hips radiographs 04/01/2023 FINDINGS: Intraoperative localization image demonstrates placement of a femoral portion of a right hemiarthroplasty. Component appears well seated. Soft tissue gas is consistent with intraoperative procedure. The femoral head has been resected in the interval. IMPRESSION: Intraoperative localization for right hip hemiarthroplasty. Electronically Signed   By: Burman Nieves M.D.   On: 04/02/2023 15:42        Scheduled Meds:  acetaminophen  1,000 mg Oral Q8H   amLODipine  2.5 mg Oral Daily   carvedilol  3.125 mg Oral BID   docusate sodium  100 mg Oral BID   Fe Fum-Vit C-Vit B12-FA  1 capsule Oral QPC breakfast   feeding supplement  237 mL Oral BID BM   lactulose  10 g Oral TID   lipase/protease/amylase  72,000 Units Oral TID WC   melatonin  5 mg Oral QHS   methocarbamol  500 mg Oral TID   mirtazapine  15 mg Oral QHS   multivitamin with minerals  1 tablet Oral Daily   pantoprazole  40 mg Oral Daily   rifaximin  550 mg Oral BID   rivaroxaban  20 mg Oral Daily   senna  1 tablet Oral BID   Continuous Infusions:  sodium chloride 75 mL/hr  at 04/04/23 1039     LOS: 3 days     Tresa Moore, MD [facility]   If 7PM-7AM, please contact night-coverage  04/04/2023, 11:46 AM

## 2023-04-04 NOTE — Progress Notes (Signed)
Physical Therapy Treatment Patient Details Name: Joe [name] MRN: [id number] DOB: December 06, 1945 Today's Date: 04/04/2023   History of Present Illness Pt is a 77 yo male s/p R hip hemiarthroplasty. 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    PT Comments    Pt oriented to name, frustrated with PT throughout session. Ultimately totalAx2 for all mobility, but today the patient vocalized "just let me do it" several times, but then did not attempt to move. Once in standing he was able to maintain weight bearing with CGAx2, but still unable to truly take any steps. Did take short, shuffled steps back towards EOB. Returned to supine with needs in reach. The patient would benefit from further skilled PT intervention to continue to progress towards goals. Recommendation remains appropriate.     Recommendations for follow up therapy are one component of a multi-disciplinary discharge planning process, led by the attending physician.  Recommendations may be updated based on patient status, additional functional criteria and insurance authorization.  Follow Up Recommendations       Assistance Recommended at Discharge Frequent or constant Supervision/Assistance  Patient can return home with the following Two people to help with walking and/or transfers;Two people to help with bathing/dressing/bathroom;Assistance with cooking/housework;Assist for transportation;Direct supervision/assist for medications management;Assistance with feeding;Help with stairs or ramp for entrance   Equipment Recommendations  Other (comment) (TBD)    Recommendations for Other Services       Precautions / Restrictions Precautions Precautions: Fall;Posterior Hip Precaution Booklet Issued: No Restrictions Weight Bearing Restrictions: Yes RLE Weight Bearing: Weight bearing as  tolerated     Mobility  Bed Mobility Overal bed mobility: Needs Assistance Bed Mobility: Supine to Sit, Sit to Supine     Supine to sit: Total assist, +2 for physical assistance Sit to supine: Total assist, +2 for physical assistance        Transfers Overall transfer level: Needs assistance Equipment used: Rolling walker (2 wheels) Transfers: Sit to/from Stand Sit to Stand: Total assist, +2 safety/equipment, From elevated surface           General transfer comment: extra time to proceess commands    Ambulation/Gait               General Gait Details: unable to truly ambulate. a few shuffled mini steps back towards EOB   Stairs             Wheelchair Mobility    Modified Rankin (Stroke Patients Only)       Balance Overall balance assessment: Needs assistance Sitting-balance support: Feet supported, Bilateral upper extremity supported Sitting balance-Leahy Scale: Fair Sitting balance - Comments: able to sit with BLE on ground once repositioned in midline   Standing balance support: Reliant on assistive device for balance Standing balance-Leahy Scale: Poor Standing balance comment: able to somewhat support his own weight in static standing                            Cognition Arousal/Alertness: Awake/alert Behavior During Therapy: Agitated Overall Cognitive Status: History of cognitive impairments - at baseline                                 General Comments: 1 step cues needed.  Family informed pt does better  when utilizing "don't ask, tell" directions.        Exercises      General Comments        Pertinent Vitals/Pain Pain Assessment Pain Assessment: Faces Faces Pain Scale: Hurts a little bit Pain Location: pt did not specify Pain Descriptors / Indicators: Grimacing, Guarding, Moaning Pain Intervention(s): Limited activity within patient's tolerance, Monitored during session, Repositioned    Home  Living                          Prior Function            PT Goals (current goals can now be found in the care plan section) Progress towards PT goals: Progressing toward goals    Frequency    Min 4X/week      PT Plan Current plan remains appropriate    Co-evaluation              AM-PAC PT "6 Clicks" Mobility   Outcome Measure  Help needed turning from your back to your side while in a flat bed without using bedrails?: Total Help needed moving from lying on your back to sitting on the side of a flat bed without using bedrails?: Total Help needed moving to and from a bed to a chair (including a wheelchair)?: Total Help needed standing up from a chair using your arms (e.g., wheelchair or bedside chair)?: Total Help needed to walk in hospital room?: Total Help needed climbing 3-5 steps with a railing? : Total 6 Click Score: 6    End of Session Equipment Utilized During Treatment: Gait belt;Right knee immobilizer Activity Tolerance: Other (comment) (limited by cognition, agitation) Patient left: in bed;with call bell/phone within reach;with bed alarm set;with family/visitor present Nurse Communication: Mobility status PT Visit Diagnosis: Other abnormalities of gait and mobility (R26.89);Muscle weakness (generalized) (M62.81);Difficulty in walking, not elsewhere classified (R26.2)     Time: 1610-9604 PT Time Calculation (min) (ACUTE ONLY): 13 min  Charges:  $Therapeutic Activity: 8-22 mins                    Olga Coaster PT, DPT 10:59 AM,04/04/23

## 2023-04-04 NOTE — Plan of Care (Signed)
  Problem: Pain Managment: Goal: General experience of comfort will improve Outcome: Progressing   Problem: Safety: Goal: Ability to remain free from injury will improve Outcome: Progressing   Problem: Activity: Goal: Ability to avoid complications of mobility impairment will improve Outcome: Progressing Goal: Ability to tolerate increased activity will improve Outcome: Progressing   Problem: Pain Management: Goal: Pain level will decrease with appropriate interventions Outcome: Progressing   Problem: Skin Integrity: Goal: Will show signs of wound healing Outcome: Progressing

## 2023-04-04 NOTE — TOC Progression Note (Signed)
Transition of Care [facility]) - Progression Note    Patient Details  Name: Joe [name] MRN: [id number] Date of Birth: October 30, 1945  Transition of Care [facility]) CM/SW Contact  Liliana Cline, LCSW Phone Number: 04/04/2023, 10:10 AM  Clinical Narrative:    Per MD, patient medically ready to DC to [facility] tomorrow. Spoke to Tammy in Admissions at [facility] who confirms patient can return tomorrow pending auth and can get PT/OT. Attempted to start auth in [place]. Unable to pull patient up. Called [facility], they state they also cannot pull patient up and patient is likely managed by [facility]. If this is the case, SNF will have to start auth.  Notified Tammy at [facility] who is following up with their Business Office.         Expected Discharge Plan and Services                                               Social Determinants of Health (SDOH) Interventions SDOH Screenings   Food Insecurity: No Food Insecurity (04/01/2023)  Housing: Low Risk  (04/01/2023)  Transportation Needs: No Transportation Needs (04/01/2023)  Utilities: Not At Risk (04/01/2023)  Tobacco Use: High Risk (04/02/2023)    Readmission Risk Interventions     No data to display

## 2023-04-04 NOTE — Progress Notes (Addendum)
   Subjective: 2 Days Post-Op Procedure(s) (LRB): ARTHROPLASTY BIPOLAR HIP (HEMIARTHROPLASTY) (Right) Patient reports pain as mild.   Patient is well, and has had no acute complaints or problems Denies any CP, SOB, ABD pain. We will continue therapy today.  Plan is to go Skilled nursing facility after hospital stay.  Objective: Vital signs in last 24 hours: Temp:  [98.1 F (36.7 C)-98.8 F (37.1 C)] 98.1 F (36.7 C) (06/16 0827) Pulse Rate:  [76-87] 76 (06/16 0827) Resp:  [16-20] 16 (06/16 0827) BP: (102-109)/(55-69) 109/55 (06/16 0827) SpO2:  [94 %-100 %] 100 % (06/16 0827)  Intake/Output from previous day: 06/15 0701 - 06/16 0700 In: 646.4 [P.O.:120; I.V.:526.4] Out: 200 [Urine:200] Intake/Output this shift: Total I/O In: -  Out: 150 [Urine:150]  Recent Labs    04/01/23 1856 04/03/23 0931 04/04/23 0818  HGB 11.2* 9.4* 7.8*   Recent Labs    04/03/23 0931 04/04/23 0818  WBC 11.1* 7.9  RBC 3.28* 2.73*  HCT 29.6* 24.1*  PLT 108* 95*   Recent Labs    04/03/23 0931 04/04/23 0818  NA 138 143  K 3.5 3.7  CL 111 117*  CO2 17* 20*  BUN 22 30*  CREATININE 1.26* 1.17  GLUCOSE 105* 102*  CALCIUM 7.8* 8.3*   Recent Labs    04/01/23 2310  INR 1.4*    EXAM General - Patient is Alert, Appropriate, and Oriented Extremity - Neurovascular intact Sensation intact distally Intact pulses distally Dorsiflexion/Plantar flexion intact Knee immobilizer intact.  Compartments soft. Dressing - moderate drainage, new dressing applied Motor Function - intact, moving foot and toes well on exam.   Past Medical History:  Diagnosis Date   Alcoholic liver disease (HCC)    Cerebral infarction (HCC)    Chronic pancreatitis (HCC)    Depression    DVT, lower extremity (HCC)    Dysphagia, oropharyngeal phase    GERD (gastroesophageal reflux disease)    Hypertension    Iron deficiency anemia    Muscle weakness (generalized)    Pancreatitis    Patient denies medical  problems    Personal history of transient ischemic attack    Thrombocytopenia, unspecified (HCC)    Vitamin D deficiency, unspecified     Assessment/Plan:   2 Days Post-Op Procedure(s) (LRB): ARTHROPLASTY BIPOLAR HIP (HEMIARTHROPLASTY) (Right) Principal Problem:   Closed right hip fracture (HCC) Active Problems:   Essential hypertension   GERD without esophagitis   Alcoholic liver disease (HCC)   Chronic pancreatitis (HCC)  Estimated body mass index is 22.52 kg/m as calculated from the following:   Height as of this encounter: 5\' 9"  (1.753 m).   Weight as of this encounter: 69.2 kg. Advance diet Up with therapy, weightbearing as tolerated Pain well-controlled VSS Acute post op blood loss anemia - Hgb 7.8, start oral Fe supplement. Recheck Hgb in the am Care management to assist with discharge to skilled nursing facility.  DVT Prophylaxis - Xarelto, TED hose, and SCDs Weight-Bearing as tolerated to right leg   T. Cranston Neighbor, PA-C [facility] 04/04/2023, 9:54 AM   Patient seen and examined, agree with above plan.  The patient is doing well status post right hip hemiarthroplasty, no concerns at this time.  Pain is controlled. Anemia post op on FE, will continue to watch H/H. All questions answered.   Reinaldo Berber MD

## 2023-04-05 ENCOUNTER — Encounter: Payer: Self-pay | Admitting: Orthopedic Surgery

## 2023-04-05 DIAGNOSIS — S72001D Fracture of unspecified part of neck of right femur, subsequent encounter for closed fracture with routine healing: Secondary | ICD-10-CM | POA: Diagnosis not present

## 2023-04-05 LAB — BASIC METABOLIC PANEL
Anion gap: 7 (ref 5–15)
BUN: 26 mg/dL — ABNORMAL HIGH (ref 8–23)
CO2: 19 mmol/L — ABNORMAL LOW (ref 22–32)
Calcium: 8.2 mg/dL — ABNORMAL LOW (ref 8.9–10.3)
Chloride: 114 mmol/L — ABNORMAL HIGH (ref 98–111)
Creatinine, Ser: 0.81 mg/dL (ref 0.61–1.24)
GFR, Estimated: >60 mL/min (ref >60)
Glucose, Bld: 92 mg/dL (ref 70–99)
Potassium: 3.7 mmol/L (ref 3.5–5.1)
Sodium: 140 mmol/L (ref 135–145)

## 2023-04-05 LAB — CBC
HCT: 23.6 % — ABNORMAL LOW (ref 39.0–52.0)
Hemoglobin: 7.5 g/dL — ABNORMAL LOW (ref 13.0–17.0)
MCH: 28.8 pg (ref 26.0–34.0)
MCHC: 31.8 g/dL (ref 30.0–36.0)
MCV: 90.8 fL (ref 80.0–100.0)
Platelets: 107 10*3/uL — ABNORMAL LOW (ref 150–400)
RBC: 2.6 MIL/uL — ABNORMAL LOW (ref 4.22–5.81)
RDW: 16.4 % — ABNORMAL HIGH (ref 11.5–15.5)
WBC: 7.3 10*3/uL (ref 4.0–10.5)
nRBC: 0 % (ref 0.0–0.2)

## 2023-04-05 MED ORDER — TRAMADOL HCL 50 MG PO TABS: 50.0000 mg | ORAL_TABLET | Freq: Four times a day (QID) | ORAL | 0 refills | Status: AC | PRN

## 2023-04-05 MED ORDER — FE FUM-VIT C-VIT B12-FA 460-60-0.01-1 MG PO CAPS: 1.0000 | ORAL_CAPSULE | Freq: Every day | ORAL | 0 refills | Status: AC

## 2023-04-05 MED ORDER — RIVAROXABAN 10 MG PO TABS: 10.0000 mg | ORAL_TABLET | Freq: Every day | ORAL | 0 refills | Status: AC

## 2023-04-05 MED ORDER — METHOCARBAMOL 500 MG PO TABS: 500.0000 mg | ORAL_TABLET | Freq: Three times a day (TID) | ORAL | Status: AC | PRN

## 2023-04-05 MED ORDER — SENNA 8.6 MG PO TABS: 1.0000 | ORAL_TABLET | Freq: Two times a day (BID) | ORAL | 0 refills | Status: AC

## 2023-04-05 MED ORDER — DOCUSATE SODIUM 100 MG PO CAPS: 100.0000 mg | ORAL_CAPSULE | Freq: Two times a day (BID) | ORAL | 0 refills | Status: AC

## 2023-04-05 MED ORDER — KETOROLAC TROMETHAMINE 15 MG/ML IJ SOLN
15.0000 mg | Freq: Four times a day (QID) | INTRAMUSCULAR | Status: DC
  Administered 2023-04-05: 15 mg via INTRAVENOUS
  Filled 2023-04-05: qty 1

## 2023-04-05 MED ORDER — RIVAROXABAN 10 MG PO TABS: 10.0000 mg | ORAL_TABLET | Freq: Every day | ORAL | Status: DC

## 2023-04-05 MED ORDER — ENSURE ENLIVE PO LIQD: 237.0000 mL | Freq: Two times a day (BID) | ORAL | 12 refills | Status: AC

## 2023-04-05 NOTE — Plan of Care (Signed)

## 2023-04-05 NOTE — Discharge Summary (Signed)
Physician Discharge Summary  Gilman Cantey ZOX:[id number] DOB: January 17, 1946 DOA: 04/01/2023  PCP: Rosetta Posner, MD  Admit date: 04/01/2023 Discharge date: 04/05/2023  Admitted From: SNF Disposition:  SNF/LTC  Recommendations for Outpatient Follow-up:  Follow up with PCP in 1-2 weeks Follow up outpatient orthpedics 2 weeks  Home Health:No Equipment/Devices:None  Discharge Condition:Stable  CODE STATUS:DNR  Diet recommendation: Dysphagia 2  Brief/Interim Summary:  77 y.o. 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  He usually ambulates with a walker.  He noted acute onset of right hip pain with inability to ambulate after his fall.  He stated that he possibly hit his head with no injuries or loss of consciousness.  No paresthesias or focal muscle weakness.  He denies any nausea or vomiting.  No chest pain or palpitations.  No cough or wheezing or dyspnea.  No dysuria, oliguria or hematuria or flank pain.  No fever or chills.  His last Xarelto dose was yesterday morning.  He is overall a poor historian likely due to underlying dementia   6/17: On day of discharge noted that patient had been taken off of his anticoagulation by an outside physician presumably at long-term care/skilled nursing.  Pharmacy assistance requested.  Verified that [facility] and he was no longer taking Xarelto.  I discussed this with orthopedic surgery.  At time of discharge I will recommend low-dose Xarelto 10 mg daily for total course of 2 weeks postoperative for DVT prophylaxis.  No further anticoagulation at that point.  Patient medically ready for discharge to skilled nursing facility.    Discharge Diagnoses:  Principal Problem:   Closed  right hip fracture (HCC) Active Problems:   Essential hypertension   GERD without esophagitis   Alcoholic liver disease (HCC)   Chronic pancreatitis (HCC)    * Closed right hip fracture [facility]) Orthopedics consulted from ED.  Plan for surgical management. Status post right hip hemiarthroplasty on 6/14 Tolerated procedure well.  No immediate complications Plan: Xarelto 10 mg daily for DVT prophylaxis.  Total 2-week course postoperative.  Continue PT OT at skilled nursing facility.  Stable for discharge at this time.  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 No acute issues  Discharge Instructions  Discharge Instructions     Ambulatory Referral for Lung Cancer Scre   Complete by: As directed    Diet - low sodium heart healthy   Complete by: As directed    Dysphagia 2   Increase activity slowly   Complete by: As directed       Allergies as of 04/05/2023       Reactions   Morphine Hives   Heparin    HIT   Morphine And Codeine Hives, Itching   Oxycodone-acetaminophen Other (See Comments)   Altered mental status, goes "crazy" per family        Medication List     STOP taking these medications    amLODipine 2.5 MG tablet Commonly known as: NORVASC   fluticasone 50 MCG/ACT nasal spray Commonly known as: FLONASE  oxyCODONE 5 MG immediate release tablet Commonly known as: Roxicodone   predniSONE 20 MG tablet Commonly known as: DELTASONE       TAKE these medications    carvedilol 3.125 MG tablet Commonly known as: COREG Take 3.125 mg by mouth 2 (two) times daily with a meal.   Cholecalciferol 1.25 MG (50000 UT) Tabs Take 1 tablet by mouth once a week. On Mondays   docusate sodium 100 MG capsule Commonly  known as: COLACE Take 1 capsule (100 mg total) by mouth 2 (two) times daily.   Fe Fum-Vit C-Vit B12-FA Caps capsule Commonly known as: TRIGELS-F FORTE Take 1 capsule by mouth daily after breakfast. Start taking on: April 06, 2023   feeding supplement Liqd Take 237 mLs by mouth 2 (two) times daily between meals.   lactulose 10 GM/15ML solution Commonly known as: CHRONULAC Take 40 g by mouth 3 (three) times daily. Pts dose is 60mL (40g)   lipase/protease/amylase [address] UNITS Cpep capsule Commonly known as: CREON Take 3 capsules by mouth 3 (three) times daily with meals. Can also take 1 capsule twice a day with snack   methocarbamol 500 MG tablet Commonly known as: ROBAXIN Take 1 tablet (500 mg total) by mouth every 8 (eight) hours as needed for muscle spasms.   mirtazapine 7.5 MG tablet Commonly known as: REMERON Take 15 mg by mouth at bedtime. Pt takes 15mg  (2 tablets) at bedtime   multivitamin with minerals Tabs tablet Take 1 tablet by mouth daily.   pantoprazole 40 MG tablet Commonly known as: PROTONIX Take 1 tablet (40 mg total) by mouth daily. What changed: how much to take   rivaroxaban 10 MG Tabs tablet Commonly known as: XARELTO Take 1 tablet (10 mg total) by mouth daily for 12 days. Start taking on: April 06, 2023   senna 8.6 MG Tabs tablet Commonly known as: SENOKOT Take 1 tablet (8.6 mg total) by mouth 2 (two) times daily.   traMADol 50 MG tablet Commonly known as: ULTRAM Take 1 tablet (50 mg total) by mouth every 6 (six) hours as needed for moderate pain.   Xifaxan 550 MG Tabs tablet Generic drug: rifaximin Take 550 mg by mouth 2 (two) times daily.        Contact information for follow-up providers     Dedra Skeens, PA-C Follow up in 2 week(s).   Specialty: Orthopedic Surgery Contact information: [address] [place] [address] [phone]              Contact information for after-discharge care     Destination     [facility] [address] [place] [facility] Preferred SNF .   Service: Skilled Nursing Contact information: [address] [place] [address] [phone]                    Allergies  Allergen Reactions   Morphine Hives   Heparin     HIT   Morphine And Codeine Hives and Itching   Oxycodone-Acetaminophen Other (See Comments)    Altered mental status, goes "crazy" per family    Consultations: Orthopedics   Procedures/Studies: DG Pelvis Portable  Result Date: 04/02/2023 CLINICAL DATA:  Status right post hip hemiarthroplasty. EXAM: PORTABLE PELVIS 1-2 VIEWS COMPARISON:  Intraoperative AP view of the bilateral hips 04/02/2023; Pelvis and right hip radiographs 04/01/2023 FINDINGS: Interval right hip hemiarthroplasty. Expected postoperative changes including intra-articular and lateral thigh subcutaneous air. Lateral right hip surgical skin staples. The left femoroacetabular joint space is  maintained. Mild-to-moderate atherosclerotic calcifications. A Foley catheter is noted. IMPRESSION: Interval right hip hemiarthroplasty with expected postoperative changes. Electronically Signed   By: Neita Garnet M.D.   On: 04/02/2023 18:05   DG Pelvis Portable  Result Date: 04/02/2023 CLINICAL DATA:  Fracture. Intraoperative film for right hemiarthroplasty EXAM: PORTABLE PELVIS 1-2 VIEWS COMPARISON:  CT 10/25/2019.  Pelvis and hips radiographs 04/01/2023 FINDINGS: Intraoperative localization image demonstrates placement of a femoral portion of a right hemiarthroplasty. Component appears well seated. Soft tissue gas is consistent with intraoperative procedure. The femoral head has been resected in the interval. IMPRESSION: Intraoperative localization for right hip hemiarthroplasty. Electronically Signed   By: Burman Nieves M.D.   On: 04/02/2023 15:42   DG Chest 1 View  Result Date: 04/01/2023 CLINICAL DATA:  Status post fall. EXAM: CHEST  1 VIEW COMPARISON:  March 26, 2018 FINDINGS: The heart  size and mediastinal contours are within normal limits. Both lungs are clear. The visualized skeletal structures are unremarkable. IMPRESSION: No active disease. Electronically Signed   By: Aram Candela M.D.   On: 04/01/2023 18:57   DG HIP UNILAT WITH PELVIS 2-3 VIEWS RIGHT  Result Date: 04/01/2023 CLINICAL DATA:  Status post fall. EXAM: DG HIP (WITH OR WITHOUT PELVIS) 2-3V RIGHT COMPARISON:  None Available. FINDINGS: There is an acute fracture deformity extending through the neck of the proximal right femur. There is no evidence of dislocation. Mild degenerative changes are seen in the form of joint space narrowing and acetabular sclerosis. IMPRESSION: Acute fracture of the proximal right femur. Electronically Signed   By: Aram Candela M.D.   On: 04/01/2023 18:54   CT HEAD WO CONTRAST ( )  Result Date: 04/01/2023 CLINICAL DATA:  Head trauma, minor (Age >= 65y); Neck trauma (Age >= 65y) EXAM: CT HEAD WITHOUT CONTRAST CT CERVICAL SPINE WITHOUT CONTRAST TECHNIQUE: Multidetector CT imaging of the head and cervical spine was performed following the standard protocol without intravenous contrast. Multiplanar CT image reconstructions of the cervical spine were also generated. RADIATION DOSE REDUCTION: This exam was performed according to the departmental dose-optimization program which includes automated exposure control, adjustment of the mA and/or kV according to patient size and/or use of iterative reconstruction technique. COMPARISON:  None Available. FINDINGS: CT HEAD FINDINGS Brain: No evidence of acute infarction, hemorrhage, hydrocephalus, extra-axial collection or mass lesion/mass effect. Moderate to severe chronic microvascular ischemic change with generalized volume loss. Vascular: No hyperdense vessel or unexpected calcification. Skull: Normal. Negative for fracture or focal lesion. Sinuses/Orbits: No middle ear or mastoid effusion. Paranasal sinuses are clear. Orbits are unremarkable.  Other: None. CT CERVICAL SPINE FINDINGS Alignment: Straightening of the normal cervical lordosis. Trace retrolisthesis of C3 on C4 and C4 on C5. Skull base and vertebrae: No acute fracture. No primary bone lesion or focal pathologic process. Soft tissues and spinal canal: No prevertebral fluid or swelling. No visible canal hematoma. 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 Other: Asymmetric medialization of the right vocal cord, nonspecific. Correlate for symptoms of dysphonia. IMPRESSION: 1. No acute intracranial abnormality. 2. No acute fracture or traumatic subluxation of the cervical spine. 3. 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 4. Asymmetric medialization of the right vocal cord, nonspecific. Correlate for symptoms of dysphonia. Electronically Signed   By: Lorenza Cambridge M.D.   On:  04/01/2023 18:31   CT Cervical Spine Wo Contrast  Result Date: 04/01/2023 CLINICAL DATA:  Head trauma, minor (Age >= 65y); Neck trauma (Age >= 65y) EXAM: CT HEAD WITHOUT CONTRAST CT CERVICAL SPINE WITHOUT CONTRAST TECHNIQUE: Multidetector CT imaging of the head and cervical spine was performed following the standard protocol without intravenous contrast. Multiplanar CT image reconstructions of the cervical spine were also generated. RADIATION DOSE REDUCTION: This exam was performed according to the departmental dose-optimization program which includes automated exposure control, adjustment of the mA and/or kV according to patient size and/or use of iterative reconstruction technique. COMPARISON:  None Available. FINDINGS: CT HEAD FINDINGS Brain: No evidence of acute infarction, hemorrhage, hydrocephalus, extra-axial collection or mass lesion/mass  effect. Moderate to severe chronic microvascular ischemic change with generalized volume loss. Vascular: No hyperdense vessel or unexpected calcification. Skull: Normal. Negative for fracture or focal lesion. Sinuses/Orbits: No middle ear or mastoid effusion. Paranasal sinuses are clear. Orbits are unremarkable. Other: None. CT CERVICAL SPINE FINDINGS Alignment: Straightening of the normal cervical lordosis. Trace retrolisthesis of C3 on C4 and C4 on C5. Skull base and vertebrae: No acute fracture. No primary bone lesion or focal pathologic process. Soft tissues and spinal canal: No prevertebral fluid or swelling. No visible canal hematoma. 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 Other: Asymmetric medialization of the right vocal cord, nonspecific. Correlate for symptoms of dysphonia. IMPRESSION: 1. No acute intracranial abnormality. 2. No acute fracture or traumatic subluxation of the cervical spine. 3. 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 4. Asymmetric medialization of the right vocal cord, nonspecific. Correlate for symptoms of dysphonia. Electronically Signed   By: Lorenza Cambridge M.D.   On: 04/01/2023 18:31      Subjective: Seen and examined on the day of discharge.  Stable no distress.  Appropriate for discharge home.  Discharge Exam: Vitals:   04/05/23 0410 04/05/23 0803  BP: (!) 140/54 (!) 144/68  Pulse: 79 80  Resp: 20 14  Temp: 98.6 F (37 C) 98 F (36.7 C)  SpO2: 96% 93%   Vitals:   04/04/23 1608 04/04/23 2124 04/05/23 0410 04/05/23 0803  BP: 127/66 (!) 105/44 (!) 140/54 (!) 144/68  Pulse: 83 86 79 80  Resp: 16 18 20 14   Temp: 98.3 F (36.8 C) 99.1 F (37.3 C) 98.6 F (37 C) 98 F (36.7 C)  TempSrc:       SpO2: 91% 93% 96% 93%  Weight:      Height:        General: Pt is alert, awake, not in acute distress Cardiovascular: RRR, S1/S2 +, no rubs, no gallops Respiratory: CTA bilaterally, no wheezing, no rhonchi Abdominal: Soft, NT, ND, bowel sounds + Extremities: no edema, no cyanosis    The results of significant diagnostics from this hospitalization (including imaging, microbiology, ancillary and laboratory) are listed below for reference.     Microbiology: Recent Results (from the past 240 hour(s))  MRSA Next Gen by PCR, Nasal     Status: None   Collection Time: 04/01/23 11:00 PM   Specimen: Nasal Mucosa; Nasal Swab  Result Value Ref Range Status   MRSA by PCR Next Gen NOT DETECTED NOT DETECTED Final    Comment: (NOTE) The GeneXpert MRSA Assay (FDA approved for NASAL specimens only), is one component of  a comprehensive MRSA colonization surveillance program. It is not intended to diagnose MRSA infection nor to guide or monitor treatment for MRSA infections. Test performance is not FDA approved in patients less than 45 years old. Performed at [facility], [address] [place], [place] [address]      Labs: BNP (last 3 results) No results for input(s): "BNP" in the last 8760 hours. Basic Metabolic Panel: Recent Labs  Lab 04/01/23 1856 04/03/23 0931 04/04/23 0818 04/05/23 0803  NA 138 138 143 140  K 4.1 3.5 3.7 3.7  CL 109 111 117* 114*  CO2 20* 17* 20* 19*  GLUCOSE 91 105* 102* 92  BUN 12 22 30* 26*  CREATININE 0.59* 1.26* 1.17 0.81  CALCIUM 8.5* 7.8* 8.3* 8.2*   Liver Function Tests: Recent Labs  Lab 04/01/23 1856  AST 38  ALT 15  ALKPHOS 65  BILITOT 2.5*  PROT 6.3*  ALBUMIN 3.0*   No results for input(s): "LIPASE", "AMYLASE" in the last 168 hours. No results for input(s): "AMMONIA" in the last 168 hours. CBC: Recent Labs  Lab 04/01/23 1856 04/03/23 0931 04/04/23 0818 04/05/23 0803  WBC 5.9 11.1* 7.9 7.3  HGB 11.2* 9.4* 7.8*  7.5*  HCT 34.2* 29.6* 24.1* 23.6*  MCV 86.6 90.2 88.3 90.8  PLT 95* 108* 95* 107*   Cardiac Enzymes: No results for input(s): "CKTOTAL", "CKMB", "CKMBINDEX", "TROPONINI" in the last 168 hours. BNP: Invalid input(s): "POCBNP" CBG: Recent Labs  Lab 04/04/23 1739 04/04/23 2158  GLUCAP 100* 90   D-Dimer No results for input(s): "DDIMER" in the last 72 hours. Hgb A1c No results for input(s): "HGBA1C" in the last 72 hours. Lipid Profile No results for input(s): "CHOL", "HDL", "LDLCALC", "TRIG", "CHOLHDL", "LDLDIRECT" in the last 72 hours. Thyroid function studies No results for input(s): "TSH", "T4TOTAL", "T3FREE", "THYROIDAB" in the last 72 hours.  Invalid input(s): "FREET3" Anemia work up No results for input(s): "VITAMINB12", "FOLATE", "FERRITIN", "TIBC", "IRON", "RETICCTPCT" in the last 72 hours. Urinalysis    Component Value Date/Time   COLORURINE AMBER (A) 10/24/2019 2357   APPEARANCEUR HAZY (A) 10/24/2019 2357   LABSPEC 1.025 10/24/2019 2357   PHURINE 5.0 10/24/2019 2357   GLUCOSEU NEGATIVE 10/24/2019 2357   HGBUR NEGATIVE 10/24/2019 2357   BILIRUBINUR MODERATE (A) 10/24/2019 2357   KETONESUR NEGATIVE 10/24/2019 2357   PROTEINUR 30 (A) 10/24/2019 2357   UROBILINOGEN 0.2 10/01/2009 1513   NITRITE NEGATIVE 10/24/2019 2357   LEUKOCYTESUR NEGATIVE 10/24/2019 2357   Sepsis Labs Recent Labs  Lab 04/01/23 1856 04/03/23 0931 04/04/23 0818 04/05/23 0803  WBC 5.9 11.1* 7.9 7.3   Microbiology Recent Results (from the past 240 hour(s))  MRSA Next Gen by PCR, Nasal     Status: None   Collection Time: 04/01/23 11:00 PM   Specimen: Nasal Mucosa; Nasal Swab  Result Value Ref Range Status   MRSA by PCR Next Gen NOT DETECTED NOT DETECTED Final    Comment: (NOTE) The GeneXpert MRSA Assay (FDA approved for NASAL specimens only), is one component of a comprehensive MRSA colonization surveillance program. It is not intended to diagnose MRSA infection nor to guide or monitor  treatment for MRSA infections. Test performance is not FDA approved in patients less than 78 years old. Performed at [facility], [address], [place], [place] [address]      Time coordinating discharge: Over 30 minutes  SIGNED:   Tresa Moore, MD  [facility] 04/05/2023, 1:01 PM Pager   If 7PM-7AM, please  contact night-coverage

## 2023-04-05 NOTE — Progress Notes (Signed)
Report called Joe [name] at [facility]. Daughter Joe [name] is aware patient is being transferred by to the facility

## 2023-04-05 NOTE — Care Management Important Message (Signed)
Important Message  Patient Details  Name: Joe [name] MRN: [id number] Date of Birth: 1946-01-30   Medicare Important Message Given:  N/A - LOS <3 / Initial given by admissions     [name] 04/05/2023, 11:06 AM

## 2023-04-05 NOTE — Discharge Instructions (Signed)

## 2023-04-05 NOTE — TOC Progression Note (Addendum)
Transition of Care [facility]) - Progression Note    Patient Details  Name: Joe [name] MRN: [id number] Date of Birth: 04/11/1946  Transition of Care [facility]) CM/SW Contact  Marlowe Sax, RN Phone Number: 04/05/2023, 12:36 PM  Clinical Narrative:     The partient will return to room 504B at [facility], Tammy stated that the patient is [facility] and they will review his information after he gets to [facility] and they will determine if they will cover Therapy, the patient is good to return to [facility] today  Spoke to Daughter [name]  I explained to [employer] his daughter and let her know that he will return to room 504B     EMS called and arranged   Expected Discharge Plan and Services                                               Social Determinants of Health (SDOH) Interventions SDOH Screenings   Food Insecurity: No Food Insecurity (04/01/2023)  Housing: Low Risk  (04/01/2023)  Transportation Needs: No Transportation Needs (04/01/2023)  Utilities: Not At Risk (04/01/2023)  Tobacco Use: High Risk (04/02/2023)    Readmission Risk Interventions     No data to display

## 2023-04-05 NOTE — Progress Notes (Signed)
   Subjective: 3 Days Post-Op Procedure(s) (LRB): ARTHROPLASTY BIPOLAR HIP (HEMIARTHROPLASTY) (Right) Patient reports pain as mild.  Confused. Patient is well, and has had no acute complaints or problems Denies any CP, SOB, ABD pain. We will continue therapy today.  Plan is to go Skilled nursing facility after hospital stay.  Objective: Vital signs in last 24 hours: Temp:  [98.1 F (36.7 C)-99.1 F (37.3 C)] 98.6 F (37 C) (06/17 0410) Pulse Rate:  [76-86] 79 (06/17 0410) Resp:  [16-20] 20 (06/17 0410) BP: (105-140)/(44-66) 140/54 (06/17 0410) SpO2:  [91 %-100 %] 96 % (06/17 0410)  Intake/Output from previous day: 06/16 0701 - 06/17 0700 In: -  Out: 450 [Urine:450] Intake/Output this shift: No intake/output data recorded.  Recent Labs    04/03/23 0931 04/04/23 0818  HGB 9.4* 7.8*   Recent Labs    04/03/23 0931 04/04/23 0818  WBC 11.1* 7.9  RBC 3.28* 2.73*  HCT 29.6* 24.1*  PLT 108* 95*   Recent Labs    04/03/23 0931 04/04/23 0818  NA 138 143  K 3.5 3.7  CL 111 117*  CO2 17* 20*  BUN 22 30*  CREATININE 1.26* 1.17  GLUCOSE 105* 102*  CALCIUM 7.8* 8.3*   No results for input(s): "LABPT", "INR" in the last 72 hours.   EXAM General - Patient is Alert, Appropriate, and Oriented Extremity - Neurovascular intact Sensation intact distally Intact pulses distally Dorsiflexion/Plantar flexion intact Knee immobilizer intact.  Compartments soft. Dressing -clean dry and intact  Motor Function - intact, moving foot and toes well on exam.   Past Medical History:  Diagnosis Date   Alcoholic liver disease (HCC)    Cerebral infarction (HCC)    Chronic pancreatitis (HCC)    Depression    DVT, lower extremity (HCC)    Dysphagia, oropharyngeal phase    GERD (gastroesophageal reflux disease)    Hypertension    Iron deficiency anemia    Muscle weakness (generalized)    Pancreatitis    Patient denies medical problems    Personal history of transient ischemic  attack    Thrombocytopenia, unspecified (HCC)    Vitamin D deficiency, unspecified     Assessment/Plan:   3 Days Post-Op Procedure(s) (LRB): ARTHROPLASTY BIPOLAR HIP (HEMIARTHROPLASTY) (Right) Principal Problem:   Closed right hip fracture (HCC) Active Problems:   Essential hypertension   GERD without esophagitis   Alcoholic liver disease (HCC)   Chronic pancreatitis (HCC)  Estimated body mass index is 22.52 kg/m as calculated from the following:   Height as of this encounter: 5\' 9"  (1.753 m).   Weight as of this encounter: 69.2 kg. Advance diet Up with therapy, weightbearing as tolerated Pain well-controlled VSS Acute post op blood loss anemia - Hgb 7.8, continue oral Fe supplement. Care management to assist with discharge to skilled nursing facility.  DVT Prophylaxis - Xarelto, TED hose, and SCDs Weight-Bearing as tolerated to right leg   Dedra Skeens PA-C [facility] 04/05/2023, 7:22 AM

## 2023-04-05 NOTE — Evaluation (Signed)
Clinical/Bedside Swallow Evaluation Patient Details  Name: Joe [name] MRN: [id number] Date of Birth: 1946/07/04  Today's Date: 04/05/2023 Time: SLP Start Time (ACUTE ONLY): 0945 SLP Stop Time (ACUTE ONLY): 1010 SLP Time Calculation (min) (ACUTE ONLY): 25 min  Past Medical History:  Past Medical History:  Diagnosis Date   Alcoholic liver disease (HCC)    Cerebral infarction (HCC)    Chronic pancreatitis (HCC)    Depression    DVT, lower extremity (HCC)    Dysphagia, oropharyngeal phase    GERD (gastroesophageal reflux disease)    Hypertension    Iron deficiency anemia    Muscle weakness (generalized)    Pancreatitis    Patient denies medical problems    Personal history of transient ischemic attack    Thrombocytopenia, unspecified (HCC)    Vitamin D deficiency, unspecified    Past Surgical History:  Past Surgical History:  Procedure Laterality Date   WHIPPLE PROCEDURE     HPI:  77 y.o. 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  He usually ambulates with a walker.  He noted acute onset of right hip pain with inability to ambulate after his fall.  He stated that he possibly hit his head with no injuries or loss of consciousness. Head CT 04/01/23: No acute intracranial abnormality. No acute fracture or traumatic subluxation of the cervical spine. 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 Asymmetric medialization of the right vocal cord, nonspecific. Correlate for symptoms of dysphonia. 04/01/23: No active disease. Pt is currently on a Dys 3 solids and thin liquids diet and room air. Nursing reporting pocketing  with solids.    Assessment / Plan / Recommendation  Clinical Impression  Pt seen for bedside swallow assessment in the setting of nursing concern for pocketing. Pt seen with trials of Dys 3 solids, thin liquids, and puree solids. No overt or subtle s/sx pharyngeal dysphagia noted. No change to vocal quality across trials. Oral phase is significantly impacted by pt's mentation, leading to perseverative mastication, oral residue (accumulating to buccal pocketing), and reduced attention to material in oral cavity. Pt requiring frequent verbal redirection to completion of oral transfer and use of liquid wash.   Pt is at increased risk for aspiration based on mentation and reduced dentition. Given results of assessment, recommend initiation of Dys 2 (chopped) to aid oral transfer/manipulation and with thin liquids with STRICT aspiration precautions (slow rate, small bites, elevated HOB, and alert for PO intake). Medications crushed in puree. Supervision with meals and verbal cues for compliance with aspiration precautions. Oral care after meals/meds to assess/remove pocketing. Given pt's reduced insight/carryover/buy-in for therapy, pt is not a candidate for follow up SLP services. MD and RN aware of recommendations. SLP Visit Diagnosis: Dysphagia, unspecified (R13.10)    Aspiration Risk  Mild aspiration risk    Diet Recommendation   Dys 2; Thin Liquids   Medication Administration: Crushed with puree    Other  Recommendations Oral Care Recommendations: Oral care BID;Other (Comment) (after meds/meals)    Recommendations for follow up therapy are one component of a multi-disciplinary discharge planning process, led by the attending physician.  Recommendations may be updated based on patient status, additional functional criteria and  insurance authorization.  Follow up Recommendations No SLP follow up      Assistance Recommended at Discharge    Functional Status Assessment Patient has not had a  recent decline in their functional status  Frequency and Duration            Prognosis        Swallow Study   General Date of Onset: 04/05/23 HPI: 77 y.o. 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  He usually ambulates with a walker.  He noted acute onset of right hip pain with inability to ambulate after his fall.  He stated that he possibly hit his head with no injuries or loss of consciousness. Head CT 04/01/23: No acute intracranial abnormality. No acute fracture or traumatic subluxation of the cervical spine. 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 Asymmetric medialization of the right vocal cord, nonspecific. Correlate for symptoms of dysphonia. 04/01/23: No active disease. Pt is currently on a Dys 3 solids and thin liquids diet and room air. Nursing reporting pocketing with solids. Type of Study: Bedside Swallow Evaluation Previous Swallow Assessment: last seen in 2019 Diet Prior to this Study: Dysphagia 3 (mechanical soft);Thin liquids (Level 0) Temperature Spikes Noted: No (Temp 99.1 (WBC 7.3)) Respiratory Status: Room air History of Recent Intubation: No Behavior/Cognition: Alert;Confused;Impulsive Oral Cavity Assessment: Within Functional Limits Oral Care Completed by SLP: Recent completion by staff Oral Cavity - Dentition: Poor condition;Missing dentition Vision: Functional for self-feeding Self-Feeding Abilities: Needs set up;Able to feed self Patient Positioning: Upright in bed Baseline Vocal Quality: Normal Volitional Cough: Cognitively unable to elicit Volitional Swallow: Unable to elicit     Oral/Motor/Sensory Function Overall Oral Motor/Sensory Function: Within functional limits   Ice Chips Ice chips: Not tested   Thin Liquid Thin Liquid: Within functional limits    Nectar Thick Nectar Thick Liquid: Not tested   Honey Thick Honey Thick Liquid: Not tested   Puree Puree: Within functional limits   Solid     Solid: Impaired Presentation: Spoon;Self Fed Oral Phase Impairments: Impaired mastication;Poor awareness of bolus Oral Phase Functional Implications: Oral residue;Prolonged oral transit;Impaired mastication Pharyngeal Phase Impairments:  (none)     [place] Emmanuell Kantz Clapp  MS [facility] SLP   [place] J Clapp 04/05/2023,11:24 AM

## 2023-04-06 LAB — SURGICAL PATHOLOGY

## 2023-04-09 ENCOUNTER — Non-Acute Institutional Stay: Payer: Medicare Other | Admitting: Nurse Practitioner

## 2023-04-09 ENCOUNTER — Encounter: Payer: Self-pay | Admitting: Nurse Practitioner

## 2023-04-09 DIAGNOSIS — R0602 Shortness of breath: Secondary | ICD-10-CM

## 2023-04-09 DIAGNOSIS — R52 Pain, unspecified: Secondary | ICD-10-CM

## 2023-04-09 DIAGNOSIS — Z515 Encounter for palliative care: Secondary | ICD-10-CM

## 2023-04-09 DIAGNOSIS — J449 Chronic obstructive pulmonary disease, unspecified: Secondary | ICD-10-CM

## 2023-04-09 DIAGNOSIS — E440 Moderate protein-calorie malnutrition: Secondary | ICD-10-CM

## 2023-04-09 DIAGNOSIS — R634 Abnormal weight loss: Secondary | ICD-10-CM

## 2023-04-09 NOTE — Progress Notes (Signed)
Therapist, nutritional Palliative Care Consult Note Telephone: [phone]  Fax: [phone]    Date of encounter: 04/09/23 5:05 PM PATIENT NAME: Joe [name] [address] [place] [address]   [phone] (home)  DOB: 22-Apr-1946 MRN: [id number] PRIMARY CARE PROVIDER:    [facility]  RESPONSIBLE PARTY:    Contact Information     Name Relation Home Work Mobile   Bland,Shateace Daughter   [phone]   DAVIS,STEPHANIE Daughter [phone]           I met face to face with patient in facility. Palliative Care was asked to follow this patient by consultation request of [facility] to address advance care planning and complex medical decision making. This is a follow up visit.                                  ASSESSMENT AND PLAN / RECOMMENDATIONS:  Symptom Management/Plan: 1. Advance Care Planning;  DNR   2. Palliative care encounter; Palliative care encounter; Palliative medicine team will continue to support patient, patient's family, and medical team. Visit consisted of counseling and education dealing with the complex and emotionally intense issues of symptom management and palliative care in the setting of serious and potentially life-threatening illness   3. Shortness of breath secondary to COPD/malnutrition,weightloss, reviewed weights; no O2, continue current medication regimen, reviewed. Continue to monitor respiratory status, weights, encourage pulmonary toileting as able.  Encourage nutrition, supplement 07/31/2022 weight 157 lbs 10/19/2022 weight 158.7 lbs 11/19/2022 weight 158.6 lbs 02/12/2023 weight 152.2 lb 03/26/2023 weight 146.4 lbs 5.8 lbs weight loss/6 weeks 4. Pain secondary to hip fx s/p sgy; continue to monitor on pain scale, continue current regimen with tramadol, PT/OT, fall risk. Currently comfortable. Following therapy if not improving may wish to consider hospice if family goals align.  Follow up Palliative Care  Visit: Palliative care will continue to follow for complex medical decision making, advance care planning, and clarification of goals. Return 2 to 8 weeks or prn.   I spent 46 minutes providing this consultation. More than 50% of the time in this consultation was spent in counseling and care coordination. PPS: 40% Chief Complaint: Follow up palliative consult for complex medical decision making, address goals, manage ongoing symptoms   HISTORY OF PRESENT ILLNESS:  Joe [name] is a 77 y.o. year old male  with multiple medical problems including COPD, Dementia, alcoholic cirrhosis of liver, portal vein thrombosis and PE with DVT of right lower extremity, chronic pancreatitis, h/o thrombocytopenia, malnutrition, HLD, h/o DVT, HTN. Joe [name] resides LTC at [facility]. Joe [name] requires assistance with transfers, bathing, dressing. Joe [name] does feed himself with appetite has declined since hospitalization. Hospitalized 04/01/2023 to 04/05/2023 for right hip pain following a fall resulting in a closed right hip fracture status post right hip hemiarthroplasty on 04/02/2023 Tolerated procedure well.  No immediate complications. At present Joe [name] is lying in bed, makes eye contact.  Joe [name] does open his eyes, denies pain. Joe [name] was irritable during pc visit, support provided. No meaningful discussion with cognitive impairment, appears to be declining. Medications, poc, goc reviewed. Will continue current plan. Attempted to contact dtg, Updated staff, PC f/u visit further discussion monitor trends of appetite, weights, monitor for functional, cognitive decline with chronic disease progression, assess any active symptoms, supportive role.   History obtained from review of EMR, discussion with primary team, and  interview with family, facility staff/caregiver and/or Joe. [name].  I reviewed available labs, medications, imaging, studies and related documents from the EMR.  Records reviewed and summarized above.  Physical  Exam: General: pleasant male ENMT: oral mucous membranes moist CV: S1S2, RRR Pulmonary: breath sounds clear Neuro:  + generalized weakness Psych: smiling, interactive Thank you for the opportunity to participate in the care of Joe [name]. Please call our office at [phone] if we can be of additional assistance.   Joe Bogie Prince [place], NP

## 2023-06-30 ENCOUNTER — Encounter (INDEPENDENT_AMBULATORY_CARE_PROVIDER_SITE_OTHER): Payer: Medicare Other

## 2023-06-30 ENCOUNTER — Encounter (INDEPENDENT_AMBULATORY_CARE_PROVIDER_SITE_OTHER): Payer: Self-pay | Admitting: Nurse Practitioner

## 2023-07-27 ENCOUNTER — Other Ambulatory Visit (INDEPENDENT_AMBULATORY_CARE_PROVIDER_SITE_OTHER): Payer: Self-pay | Admitting: Nurse Practitioner

## 2023-07-27 DIAGNOSIS — S91301A Unspecified open wound, right foot, initial encounter: Secondary | ICD-10-CM

## 2023-07-30 ENCOUNTER — Encounter (INDEPENDENT_AMBULATORY_CARE_PROVIDER_SITE_OTHER): Payer: Medicaid Other | Admitting: Nurse Practitioner

## 2023-07-30 ENCOUNTER — Ambulatory Visit (INDEPENDENT_AMBULATORY_CARE_PROVIDER_SITE_OTHER): Payer: Medicare Other

## 2023-07-30 DIAGNOSIS — S91301A Unspecified open wound, right foot, initial encounter: Secondary | ICD-10-CM | POA: Diagnosis not present

## 2023-09-03 ENCOUNTER — Other Ambulatory Visit (INDEPENDENT_AMBULATORY_CARE_PROVIDER_SITE_OTHER): Payer: Self-pay | Admitting: Nurse Practitioner

## 2023-09-03 DIAGNOSIS — S91301A Unspecified open wound, right foot, initial encounter: Secondary | ICD-10-CM

## 2023-09-13 ENCOUNTER — Ambulatory Visit (INDEPENDENT_AMBULATORY_CARE_PROVIDER_SITE_OTHER): Payer: Medicare Other | Admitting: Vascular Surgery

## 2023-09-13 ENCOUNTER — Encounter (INDEPENDENT_AMBULATORY_CARE_PROVIDER_SITE_OTHER): Payer: Self-pay

## 2023-09-13 ENCOUNTER — Ambulatory Visit (INDEPENDENT_AMBULATORY_CARE_PROVIDER_SITE_OTHER): Payer: Medicare Other

## 2023-09-13 DIAGNOSIS — I824Y1 Acute embolism and thrombosis of unspecified deep veins of right proximal lower extremity: Secondary | ICD-10-CM

## 2023-09-13 NOTE — Progress Notes (Signed)
                                                                       MRN : [id number]  Joe [name] is a 77 y.o. ([birth year]) male who presents with chief complaint of check circulation.  History of Present Illness:   Patient canceled the appointment Levora Dredge, MD  09/13/2023 8:45 AM

## 2023-09-20 ENCOUNTER — Encounter (INDEPENDENT_AMBULATORY_CARE_PROVIDER_SITE_OTHER): Payer: Self-pay | Admitting: Vascular Surgery

## 2023-09-20 ENCOUNTER — Telehealth (INDEPENDENT_AMBULATORY_CARE_PROVIDER_SITE_OTHER): Payer: Self-pay

## 2023-09-20 ENCOUNTER — Ambulatory Visit (INDEPENDENT_AMBULATORY_CARE_PROVIDER_SITE_OTHER): Payer: Medicare Other | Admitting: Vascular Surgery

## 2023-09-20 ENCOUNTER — Encounter (INDEPENDENT_AMBULATORY_CARE_PROVIDER_SITE_OTHER): Payer: Self-pay

## 2023-09-20 NOTE — Telephone Encounter (Signed)
Joe [name] requesting the Abi ultrasound results from 07/30/23.  Fax # [phone]

## 2023-09-20 NOTE — Telephone Encounter (Signed)
Fax sent.

## 2023-10-24 DIAGNOSIS — I7025 Atherosclerosis of native arteries of other extremities with ulceration: Secondary | ICD-10-CM | POA: Insufficient documentation

## 2023-10-24 NOTE — Progress Notes (Deleted)
 MRN : [id number]  Joe [name] is a 78 y.o. ([birth year]) male who presents with chief complaint of check circulation.  History of Present Illness:   The patient is seen for evaluation of painful lower extremities and diminished pulses associated with ulceration of the foot.  The patient notes the ulcer has been present for multiple weeks and has not been improving.  It is very painful and has had some drainage.  No specific history of trauma noted by the patient.  The patient denies fever or chills.  the patient does have diabetes which has been difficult to control.  Patient notes prior to the ulcer developing the extremities were painful particularly with walking.  The patient denies rest pain or dangling of an extremity off the side of the bed during the night for relief. No prior interventions or surgeries.  No history of back problems or DJD of the lumbar sacral spine.   The patient denies amaurosis fugax or recent TIA symptoms. There are no recent neurological changes noted. The patient denies history of DVT, PE or superficial thrombophlebitis. The patient denies recent episodes of angina or shortness of breath.   No outpatient medications have been marked as taking for the 10/25/23 encounter (Appointment) with Jama, Cordella MATSU, MD.    Past Medical History:  Diagnosis Date   Alcoholic liver disease (HCC)    Cerebral infarction (HCC)    Chronic pancreatitis (HCC)    Depression    DVT, lower extremity (HCC)    Dysphagia, oropharyngeal phase    GERD (gastroesophageal reflux disease)    Hypertension    Iron deficiency anemia    Muscle weakness (generalized)    Pancreatitis    Patient denies medical problems    Personal history of transient ischemic attack    Thrombocytopenia, unspecified (HCC)    Vitamin D deficiency, unspecified     Past Surgical History:  Procedure Laterality Date   HIP ARTHROPLASTY  Right 04/02/2023   Procedure: ARTHROPLASTY BIPOLAR HIP (HEMIARTHROPLASTY);  Surgeon: Tobie Priest, MD;  Location: [facility] ORS;  Service: Orthopedics;  Laterality: Right;   WHIPPLE PROCEDURE      Social History Social History   Tobacco Use   Smoking status: Former    Current packs/day: 0.00    Average packs/day: 0.5 packs/day for 50.0 years (25.0 ttl pk-yrs)    Types: Cigarettes    Start date: 02/17/1967    Quit date: 02/16/2017    Years since quitting: 6.6   Smokeless tobacco: Current  Substance Use Topics   Alcohol use: Not Currently   Drug use: Not Currently    Family History Family History  Problem Relation Age of Onset   Brain cancer Mother    Heart disease Brother     Allergies  Allergen Reactions   Morphine Hives   Heparin     HIT   Morphine And Codeine Hives and Itching   Oxycodone -Acetaminophen  Other (See Comments)    Altered mental status, goes crazy per family     REVIEW OF SYSTEMS (Negative unless checked)  Constitutional: [] Weight loss  [] Fever  [] Chills Cardiac: [] Chest pain   []   Chest pressure   [] Palpitations   [] Shortness of breath when laying flat   [] Shortness of breath with exertion. Vascular:  [x] Pain in legs with walking   [] Pain in legs at rest  [] History of DVT   [] Phlebitis   [] Swelling in legs   [] Varicose veins   [] Non-healing ulcers Pulmonary:   [] Uses home oxygen   [] Productive cough   [] Hemoptysis   [] Wheeze  [x] COPD   [] Asthma Neurologic:  [] Dizziness   [] Seizures   [] History of stroke   [] History of TIA  [] Aphasia   [] Vissual changes   [] Weakness or numbness in arm   [] Weakness or numbness in leg Musculoskeletal:   [] Joint swelling   [x] Joint pain   [] Low back pain Hematologic:  [] Easy bruising  [] Easy bleeding   [] Hypercoagulable state   [] Anemic Gastrointestinal:  [] Diarrhea   [] Vomiting  [x] Gastroesophageal reflux/heartburn   [] Difficulty swallowing. Genitourinary:  [] Chronic kidney disease   [] Difficult urination  [] Frequent urination    [] Blood in urine Skin:  [] Rashes   [] Ulcers  Psychological:  [] History of anxiety   []  History of major depression.  Physical Examination  There were no vitals filed for this visit. There is no height or weight on file to calculate BMI. Gen: WD/WN, NAD Head: [place]/AT, No temporalis wasting.  Ear/Nose/Throat: Hearing grossly intact, nares w/o erythema or drainage Eyes: PER, EOMI, sclera nonicteric.  Neck: Supple, no masses.  No bruit or JVD.  Pulmonary:  Good air movement, no audible wheezing, no use of accessory muscles.  Cardiac: RRR, normal S1, S2, no Murmurs. Vascular:  mild trophic changes, no open wounds Vessel Right Left  Radial Palpable Palpable  PT Not Palpable Not Palpable  DP Not Palpable Not Palpable  Gastrointestinal: soft, non-distended. No guarding/no peritoneal signs.  Musculoskeletal: M/S 5/5 throughout.  No visible deformity.  Neurologic: CN 2-12 intact. Pain and light touch intact in extremities.  Symmetrical.  Speech is fluent. Motor exam as listed above. Psychiatric: Judgment intact, Mood & affect appropriate for pt's clinical situation. Dermatologic: No rashes or ulcers noted.  No changes consistent with cellulitis.   CBC Lab Results  Component Value Date   WBC 7.3 04/05/2023   HGB 7.5 (L) 04/05/2023   HCT 23.6 (L) 04/05/2023   MCV 90.8 04/05/2023   PLT 107 (L) 04/05/2023    BMET    Component Value Date/Time   NA 140 04/05/2023 0803   K 3.7 04/05/2023 0803   CL 114 (H) 04/05/2023 0803   CO2 19 (L) 04/05/2023 0803   GLUCOSE 92 04/05/2023 0803   BUN 26 (H) 04/05/2023 0803   CREATININE 0.81 04/05/2023 0803   CALCIUM 8.2 (L) 04/05/2023 0803   GFRNONAA >60 04/05/2023 0803   GFRAA NOT CALCULATED 10/24/2019 1617   CrCl cannot be calculated (Patient's most recent lab result is older than the maximum 21 days allowed.).  COAG Lab Results  Component Value Date   INR 1.4 (H) 04/01/2023   INR 1.3 (H) 10/24/2019   INR 1.73 03/24/2018    Radiology No  results found.   Assessment/Plan There are no diagnoses linked to this encounter.   Cordella Shawl, MD  10/24/2023 2:14 PM

## 2023-10-25 ENCOUNTER — Encounter (INDEPENDENT_AMBULATORY_CARE_PROVIDER_SITE_OTHER): Payer: Medicare Other | Admitting: Vascular Surgery

## 2023-10-25 ENCOUNTER — Encounter (INDEPENDENT_AMBULATORY_CARE_PROVIDER_SITE_OTHER): Payer: Self-pay

## 2023-10-25 DIAGNOSIS — J449 Chronic obstructive pulmonary disease, unspecified: Secondary | ICD-10-CM

## 2023-10-25 DIAGNOSIS — I7025 Atherosclerosis of native arteries of other extremities with ulceration: Secondary | ICD-10-CM

## 2023-10-25 DIAGNOSIS — E782 Mixed hyperlipidemia: Secondary | ICD-10-CM

## 2023-10-25 DIAGNOSIS — I10 Essential (primary) hypertension: Secondary | ICD-10-CM

## 2023-10-25 DIAGNOSIS — K219 Gastro-esophageal reflux disease without esophagitis: Secondary | ICD-10-CM

## 2023-10-29 ENCOUNTER — Telehealth (INDEPENDENT_AMBULATORY_CARE_PROVIDER_SITE_OTHER): Payer: Self-pay | Admitting: Nurse Practitioner

## 2023-10-29 NOTE — Telephone Encounter (Signed)
 New message   Referral closed.   Scheduling     Department/Location Provider Visit Type Status   Mon 10/25/23  1:00 PM [facility], Cordella MATSU, MD New Vascular No Show   Mon 09/20/23 11:30 AM [facility], Cordella MATSU, MD New Vascular Canceled   Mon 09/13/23  9:00 AM [facility] W/Wo Tbi Canceled   Mon 09/13/23  9:30 AM [facility], Cordella MATSU, MD Follow Up Vascular Canceled   [facility] 07/30/23 10:45 AM [facility] 2 [facility] W/Wo Tbi Completed   Fri 07/30/23 11:30 AM [facility] Brown, Fallon E, NP New Vascular Canceled   Wed 06/30/23  2:00 PM [facility] 3 [facility] W/Wo Tbi Canceled   Wed 06/30/23  3:00 PM [facility] Brown, Fallon E, NP New Vascular Canceled
